# Patient Record
Sex: Female | Born: 1935 | Race: White | Hispanic: No | Marital: Married | Smoking: Never smoker
Health system: Southern US, Community
[De-identification: ages and names within clinical notes are randomized; demographics above are authoritative.]

## PROBLEM LIST (undated history)

## (undated) DIAGNOSIS — K635 Polyp of colon: Secondary | ICD-10-CM

## (undated) DIAGNOSIS — F0281 Dementia in other diseases classified elsewhere with behavioral disturbance: Secondary | ICD-10-CM

## (undated) DIAGNOSIS — G301 Alzheimer's disease with late onset: Secondary | ICD-10-CM

## (undated) DIAGNOSIS — I10 Essential (primary) hypertension: Secondary | ICD-10-CM

## (undated) DIAGNOSIS — I219 Acute myocardial infarction, unspecified: Secondary | ICD-10-CM

## (undated) DIAGNOSIS — K5790 Diverticulosis of intestine, part unspecified, without perforation or abscess without bleeding: Secondary | ICD-10-CM

## (undated) DIAGNOSIS — J45909 Unspecified asthma, uncomplicated: Secondary | ICD-10-CM

## (undated) HISTORY — PX: ABDOMINAL HYSTERECTOMY: SHX81

## (undated) HISTORY — PX: HERNIA REPAIR: SHX51

---

## 2004-08-11 ENCOUNTER — Ambulatory Visit: Payer: Self-pay | Admitting: Internal Medicine

## 2004-11-30 ENCOUNTER — Emergency Department: Payer: Self-pay | Admitting: Emergency Medicine

## 2006-07-19 ENCOUNTER — Ambulatory Visit: Payer: Self-pay | Admitting: Unknown Physician Specialty

## 2007-07-11 ENCOUNTER — Ambulatory Visit: Payer: Self-pay | Admitting: General Surgery

## 2007-07-11 ENCOUNTER — Other Ambulatory Visit: Payer: Self-pay

## 2007-07-17 ENCOUNTER — Ambulatory Visit: Payer: Self-pay | Admitting: General Surgery

## 2009-07-30 ENCOUNTER — Ambulatory Visit: Payer: Self-pay | Admitting: Ophthalmology

## 2009-08-11 ENCOUNTER — Ambulatory Visit: Payer: Self-pay | Admitting: Ophthalmology

## 2012-11-22 ENCOUNTER — Ambulatory Visit: Payer: Self-pay | Admitting: Internal Medicine

## 2016-06-02 ENCOUNTER — Other Ambulatory Visit: Payer: Self-pay | Admitting: Neurology

## 2016-06-02 DIAGNOSIS — R413 Other amnesia: Secondary | ICD-10-CM

## 2016-06-02 DIAGNOSIS — R410 Disorientation, unspecified: Secondary | ICD-10-CM

## 2016-06-08 ENCOUNTER — Ambulatory Visit
Admission: RE | Admit: 2016-06-08 | Discharge: 2016-06-08 | Disposition: A | Discharge status: Home / Self Care | Payer: Medicare Other | Source: Ambulatory Visit | Attending: Neurology | Admitting: Neurology

## 2016-06-08 DIAGNOSIS — G319 Degenerative disease of nervous system, unspecified: Secondary | ICD-10-CM | POA: Diagnosis not present

## 2016-06-08 DIAGNOSIS — I638 Other cerebral infarction: Secondary | ICD-10-CM | POA: Insufficient documentation

## 2016-06-08 DIAGNOSIS — R413 Other amnesia: Secondary | ICD-10-CM | POA: Insufficient documentation

## 2016-06-08 DIAGNOSIS — R410 Disorientation, unspecified: Secondary | ICD-10-CM | POA: Insufficient documentation

## 2017-01-10 ENCOUNTER — Emergency Department: Payer: Medicare Other

## 2017-01-10 ENCOUNTER — Observation Stay
Admission: EM | Admit: 2017-01-10 | Discharge: 2017-01-12 | Disposition: A | Discharge status: Home Health | Payer: Medicare Other | Attending: Internal Medicine | Admitting: Internal Medicine

## 2017-01-10 DIAGNOSIS — I638 Other cerebral infarction: Secondary | ICD-10-CM | POA: Diagnosis not present

## 2017-01-10 DIAGNOSIS — Z79899 Other long term (current) drug therapy: Secondary | ICD-10-CM | POA: Diagnosis not present

## 2017-01-10 DIAGNOSIS — I252 Old myocardial infarction: Secondary | ICD-10-CM | POA: Insufficient documentation

## 2017-01-10 DIAGNOSIS — I10 Essential (primary) hypertension: Secondary | ICD-10-CM | POA: Diagnosis not present

## 2017-01-10 DIAGNOSIS — Z9181 History of falling: Secondary | ICD-10-CM | POA: Insufficient documentation

## 2017-01-10 DIAGNOSIS — J45909 Unspecified asthma, uncomplicated: Secondary | ICD-10-CM | POA: Insufficient documentation

## 2017-01-10 DIAGNOSIS — R29700 NIHSS score 0: Secondary | ICD-10-CM | POA: Insufficient documentation

## 2017-01-10 DIAGNOSIS — G459 Transient cerebral ischemic attack, unspecified: Secondary | ICD-10-CM | POA: Diagnosis present

## 2017-01-10 DIAGNOSIS — F0281 Dementia in other diseases classified elsewhere with behavioral disturbance: Secondary | ICD-10-CM | POA: Diagnosis not present

## 2017-01-10 DIAGNOSIS — W19XXXA Unspecified fall, initial encounter: Secondary | ICD-10-CM | POA: Diagnosis present

## 2017-01-10 DIAGNOSIS — G301 Alzheimer's disease with late onset: Secondary | ICD-10-CM | POA: Insufficient documentation

## 2017-01-10 DIAGNOSIS — F0280 Dementia in other diseases classified elsewhere without behavioral disturbance: Secondary | ICD-10-CM | POA: Diagnosis present

## 2017-01-10 DIAGNOSIS — R100 Acute abdomen: Secondary | ICD-10-CM

## 2017-01-10 DIAGNOSIS — R55 Syncope and collapse: Secondary | ICD-10-CM | POA: Diagnosis present

## 2017-01-10 DIAGNOSIS — G309 Alzheimer's disease, unspecified: Secondary | ICD-10-CM

## 2017-01-10 DIAGNOSIS — R531 Weakness: Secondary | ICD-10-CM

## 2017-01-10 HISTORY — DX: Unspecified asthma, uncomplicated: J45.909

## 2017-01-10 HISTORY — DX: Dementia in other diseases classified elsewhere with behavioral disturbance: F02.81

## 2017-01-10 HISTORY — DX: Diverticulosis of intestine, part unspecified, without perforation or abscess without bleeding: K57.90

## 2017-01-10 HISTORY — DX: Acute myocardial infarction, unspecified: I21.9

## 2017-01-10 HISTORY — DX: Alzheimer's disease with late onset: G30.1

## 2017-01-10 HISTORY — DX: Essential (primary) hypertension: I10

## 2017-01-10 HISTORY — DX: Polyp of colon: K63.5

## 2017-01-10 NOTE — ED Triage Notes (Signed)
Pt arrives [facility] from home after a fall with L sided weakness per family. EMS did an NIH, was 0. Rechecked it, again was 0. Pt has no L sided weakness at this time, speech clear. Hx dementia. Pt ambulatory to toilet with shuffling gait with 1 assistance. CBG 134.

## 2017-01-10 NOTE — ED Provider Notes (Signed)
[facility] Emergency Department Provider Note   First MD Initiated Contact with Patient 01/10/17 2306     (approximate)  I have reviewed the triage vital signs and the nursing notes.  History obtained from the patient's son HISTORY  Chief Complaint Weakness and Fall    HPI Theresa Frank is a 83 y.o. female with below list of chronic medical conditions present with history of being found on the floor by her son today. Patient's son states that on arrival patient had left-sided arm and leg weakness as well as left facial drooping. The patient's son states the symptoms have since resolved but it lasted approximately one hour.   Past Medical History:  Diagnosis Date  . Alzheimer's dementia, late onset, with behavioral disturbance   . Asthma   . Colon polyps   . Diverticulosis   . Hypertension   . MI (myocardial infarction) [facility])     Patient Active Problem List   Diagnosis Date Noted  . Fall 01/11/2017  . TIA (transient ischemic attack) 01/10/2017  . HTN (hypertension) 01/10/2017  . Asthma 01/10/2017  . Alzheimer disease 01/10/2017    Past Surgical History:  Procedure Laterality Date  . ABDOMINAL HYSTERECTOMY    . HERNIA REPAIR      Prior to Admission medications   Medication Sig Start Date End Date Taking? Authorizing Provider  donepezil (ARICEPT) 5 MG tablet Take 5 mg by mouth at bedtime.   Yes [provider]  Fluticasone-Salmeterol (ADVAIR DISKUS) 250-50 MCG/DOSE AEPB Inhale 1 puff into the lungs 2 (two) times daily.   Yes [provider]  memantine (NAMENDA) 10 MG tablet Take 10 mg by mouth 2 (two) times daily.   Yes [provider]  mirtazapine (REMERON) 15 MG tablet Take 7.5 mg by mouth at bedtime.   Yes [provider]  montelukast (SINGULAIR) 10 MG tablet Take 10 mg by mouth daily.   Yes [provider]    Allergies Ciprofloxacin; Penicillins; Premarin [conjugated estrogens]; Septra  [sulfamethoxazole-trimethoprim]; and Zocor [simvastatin]  Family History  Problem Relation Age of Onset  . Stomach cancer Father     Social History Social History  Substance Use Topics  . Smoking status: Never Smoker  . Smokeless tobacco: Not on file  . Alcohol use No    Review of Systems Constitutional: No fever/chills Eyes: No visual changes. ENT: No sore throat. Cardiovascular: Denies chest pain. Respiratory: Denies shortness of breath. Gastrointestinal: No abdominal pain.  No nausea, no vomiting.  No diarrhea.  No constipation. Genitourinary: Negative for dysuria. Musculoskeletal: Negative for back pain. Integumentary: Negative for rash. Neurological:Positive for left arm and leg and facial weakness (she is now resolved)   ____________________________________________   PHYSICAL EXAM:  VITAL SIGNS: ED Triage Vitals  Enc Vitals Group     BP 01/10/17 2244 107/77     Pulse Rate 01/10/17 2244 72     Resp 01/10/17 2244 16     Temp 01/10/17 2244 98.6 F (37 C)     Temp Source 01/10/17 2244 Oral     SpO2 01/10/17 2244 97 %     Weight 01/10/17 2244 113 lb 11.2 oz (51.6 kg)     Height --      Head Circumference --      Peak Flow --      Pain Score 01/10/17 2243 0     Pain Loc --      Pain Edu? --      Excl. in GC? --  Constitutional: Alert and oriented. Well appearing and in no acute distress. Eyes: Conjunctivae are normal. PERRL. EOMI. Head: Atraumatic. Mouth/Throat: Mucous membranes are moist. Oropharynx non-erythematous. Neck: No stridor.   Cardiovascular: Normal rate, regular rhythm. Good peripheral circulation. Grossly normal heart sounds. Respiratory: Normal respiratory effort.  No retractions. Lungs CTAB. Gastrointestinal: Soft and nontender. No distention.  Musculoskeletal: No lower extremity tenderness nor edema. No gross deformities of extremities. Neurologic:  Normal speech and language. No gross focal neurologic deficits are appreciated.  Skin:   Skin is warm, dry and intact. No rash noted. Psychiatric: Mood and affect are normal. Speech and behavior are normal.  ____________________________________________   LABS (all labs ordered are listed, but only abnormal results are displayed)  Labs Reviewed  BASIC METABOLIC PANEL - Abnormal; Notable for the following:       Result Value   Glucose, Bld 107 (*)    BUN 23 (*)    GFR calc non Af Amer 51 (*)    GFR calc Af Amer 59 (*)    All other components within normal limits  CBC - Abnormal; Notable for the following:    Hemoglobin 11.8 (*)    HCT 34.6 (*)    All other components within normal limits  URINALYSIS, COMPLETE (UACMP) WITH MICROSCOPIC - Abnormal; Notable for the following:    Color, Urine YELLOW (*)    APPearance CLEAR (*)    Squamous Epithelial / LPF 0-5 (*)    All other components within normal limits  TROPONIN I  TSH  HEMOGLOBIN A1C   ____________________________________________  EKG  ED ECG REPORT I, [employer] N BROWN, the attending physician, personally viewed and interpreted this ECG.   Date: 01/10/2017  EKG Time: 11:22 PM  Rate: 68  Rhythm: Normal sinus rhythm  Axis: Normal  Intervals: Normal  ST&T Change: None  ____________________________________________  RADIOLOGY I, [place] N BROWN, personally viewed and evaluated these images (plain radiographs) as part of my medical decision making, as well as reviewing the written report by the radiologist.  Ct Head Wo Contrast  Result Date: 01/11/2017 CLINICAL DATA:  Status post fall, with confusion. Concern for head injury. Initial encounter. EXAM: CT HEAD WITHOUT CONTRAST TECHNIQUE: Contiguous axial images were obtained from the base of the skull through the vertex without intravenous contrast. COMPARISON:  CT of the head performed 06/08/2016 FINDINGS: Brain: No evidence of acute infarction, hemorrhage, hydrocephalus, extra-axial collection or mass lesion/mass effect. Prominence of the ventricles and sulci  reflects mild to moderate cortical volume loss. Cerebellar atrophy is noted. Scattered periventricular and subcortical white matter change likely reflects small vessel ischemic microangiopathy. Chronic lacunar infarcts are noted at the basal ganglia bilaterally. The brainstem and fourth ventricle are within normal limits. The cerebral hemispheres demonstrate grossly normal gray-white differentiation. No mass effect or midline shift is seen. Vascular: No hyperdense vessel or unexpected calcification. Skull: There is no evidence of fracture; visualized osseous structures are unremarkable in appearance. Sinuses/Orbits: The visualized portions of the orbits are within normal limits. The paranasal sinuses and mastoid air cells are well-aerated. Other: Mild soft tissue swelling is noted overlying the left posterior parietal calvarium. IMPRESSION: 1. No evidence of traumatic intracranial injury or fracture. 2. Mild soft tissue swelling overlying the left posterior parietal calvarium. 3. Mild to moderate cortical volume loss and scattered small vessel ischemic microangiopathy. 4. Chronic lacunar infarcts at the basal ganglia bilaterally. Electronically Signed   By: Roanna Raider M.D.   On: 01/11/2017 00:10   Dg Chest [facility]  Result Date: 01/11/2017 CLINICAL DATA:  Syncope today. EXAM: PORTABLE CHEST 1 VIEW COMPARISON:  Report from chest radiographs 10/21/2016, 06/10/2014, images not available. FINDINGS: Mild cardiomegaly, normal mediastinal contours. Atherosclerosis of the aortic arch. Small left pleural effusion. No evidence pulmonary edema. No confluent airspace disease. No pneumothorax. No acute osseous abnormalities are seen. IMPRESSION: Mild cardiomegaly, atherosclerosis of the thoracic aorta. Cardiomegaly was not described on prior exam. Small left pleural effusion. No pulmonary edema or findings of congestive failure. Electronically Signed   By: Rubye Oaks M.D.   On: 01/11/2017 00:04        Procedures   ____________________________________________   INITIAL IMPRESSION / ASSESSMENT AND PLAN / ED COURSE  Pertinent labs & imaging results that were available during my care of the patient were reviewed by me and considered in my medical decision making (see chart for details).  83 year old female presenting with history of left facial droop left arm and leg weakness that is completely resolved. Concern for possible TIA. Patient was also found down by her son raising concern for possible syncope. As such patient discussed with Dr. Sheryle Hail for hospital admission for further evaluation and management      ____________________________________________  FINAL CLINICAL IMPRESSION(S) / ED DIAGNOSES  Final diagnoses:  TIA (transient ischemic attack)     MEDICATIONS GIVEN DURING THIS VISIT:  Medications  mometasone-formoterol (DULERA) 200-5 MCG/ACT inhaler 2 puff (not administered)  memantine (NAMENDA) tablet 10 mg (not administered)  mirtazapine (REMERON) tablet 7.5 mg (not administered)  donepezil (ARICEPT) tablet 5 mg (not administered)  montelukast (SINGULAIR) tablet 10 mg (not administered)  heparin injection 5,000 Units (5,000 Units Subcutaneous Given 01/11/17 0614)  acetaminophen (TYLENOL) tablet 650 mg (not administered)    Or  acetaminophen (TYLENOL) suppository 650 mg (not administered)  docusate sodium (COLACE) capsule 100 mg (not administered)  ondansetron (ZOFRAN) tablet 4 mg (not administered)    Or  ondansetron (ZOFRAN) injection 4 mg (not administered)   stroke: mapping our early stages of recovery book ( Does not apply Given 01/11/17 0609)     NEW OUTPATIENT MEDICATIONS STARTED DURING THIS VISIT:  Current Discharge Medication List      Current Discharge Medication List      Current Discharge Medication List       Note:  This document was prepared using Dragon voice recognition software and may include unintentional dictation  errors.    Darci Current, MD 01/11/17 [phone]

## 2017-01-10 NOTE — ED Notes (Signed)
Per son, pt does not remember fall. Pt was unable to move L leg and L arm per son. Pt able to move all extremities at this time and walk. Pt is confused. Knows name, birthday, location. Unsure of situation or date.

## 2017-01-11 ENCOUNTER — Observation Stay: Payer: Medicare Other

## 2017-01-11 ENCOUNTER — Observation Stay (HOSPITAL_BASED_OUTPATIENT_CLINIC_OR_DEPARTMENT_OTHER)
Admit: 2017-01-11 | Discharge: 2017-01-11 | Disposition: A | Discharge status: Home / Self Care | Payer: Medicare Other | Attending: Internal Medicine | Admitting: Internal Medicine

## 2017-01-11 DIAGNOSIS — I639 Cerebral infarction, unspecified: Secondary | ICD-10-CM

## 2017-01-11 DIAGNOSIS — W19XXXA Unspecified fall, initial encounter: Secondary | ICD-10-CM | POA: Diagnosis present

## 2017-01-11 DIAGNOSIS — G459 Transient cerebral ischemic attack, unspecified: Secondary | ICD-10-CM | POA: Diagnosis not present

## 2017-01-11 DIAGNOSIS — E756 Lipid storage disorder, unspecified: Secondary | ICD-10-CM | POA: Diagnosis not present

## 2017-01-11 LAB — URINALYSIS, COMPLETE (UACMP) WITH MICROSCOPIC
BACTERIA UA: NONE SEEN
Bilirubin Urine: NEGATIVE
Glucose, UA: NEGATIVE mg/dL
Hgb urine dipstick: NEGATIVE
KETONES UR: NEGATIVE mg/dL
Leukocytes, UA: NEGATIVE
Nitrite: NEGATIVE
PROTEIN: NEGATIVE mg/dL
RBC / HPF: NONE SEEN RBC/hpf (ref 0–5)
Specific Gravity, Urine: 1.014 (ref 1.005–1.030)
pH: 5 (ref 5.0–8.0)

## 2017-01-11 LAB — CBC
HEMATOCRIT: 34.6 % — AB (ref 35.0–47.0)
Hemoglobin: 11.8 g/dL — ABNORMAL LOW (ref 12.0–16.0)
MCH: 30.5 pg (ref 26.0–34.0)
MCHC: 34.2 g/dL (ref 32.0–36.0)
MCV: 89.4 fL (ref 80.0–100.0)
Platelets: 308 10*3/uL (ref 150–440)
RBC: 3.87 MIL/uL (ref 3.80–5.20)
RDW: 13.9 % (ref 11.5–14.5)
WBC: 6.7 10*3/uL (ref 3.6–11.0)

## 2017-01-11 LAB — BASIC METABOLIC PANEL
Anion gap: 6 (ref 5–15)
BUN: 23 mg/dL — AB (ref 6–20)
CHLORIDE: 103 mmol/L (ref 101–111)
CO2: 28 mmol/L (ref 22–32)
CREATININE: 0.95 mg/dL (ref 0.44–1.00)
Calcium: 9.3 mg/dL (ref 8.9–10.3)
GFR calc Af Amer: 59 mL/min — ABNORMAL LOW (ref >60)
GFR calc non Af Amer: 51 mL/min — ABNORMAL LOW (ref >60)
GLUCOSE: 107 mg/dL — AB (ref 65–99)
POTASSIUM: 4.1 mmol/L (ref 3.5–5.1)
Sodium: 137 mmol/L (ref 135–145)

## 2017-01-11 LAB — ECHOCARDIOGRAM COMPLETE
Height: 68 in
Weight: 1662.4 oz

## 2017-01-11 LAB — TSH: TSH: 0.826 u[IU]/mL (ref 0.350–4.500)

## 2017-01-11 LAB — TROPONIN I: Troponin I: <0.03 ng/mL (ref <0.03)

## 2017-01-11 MED ORDER — ASPIRIN EC 81 MG PO TBEC
81.0000 mg | DELAYED_RELEASE_TABLET | Freq: Every day | ORAL | Status: DC
  Administered 2017-01-11 – 2017-01-12 (×2): 81 mg via ORAL
  Filled 2017-01-11 (×2): qty 1

## 2017-01-11 MED ORDER — MEMANTINE HCL 5 MG PO TABS
10.0000 mg | ORAL_TABLET | Freq: Two times a day (BID) | ORAL | Status: DC
  Administered 2017-01-11 – 2017-01-12 (×2): 10 mg via ORAL
  Filled 2017-01-11 (×3): qty 2

## 2017-01-11 MED ORDER — MIRTAZAPINE 15 MG PO TABS
7.5000 mg | ORAL_TABLET | Freq: Every day | ORAL | Status: DC
  Filled 2017-01-11: qty 1

## 2017-01-11 MED ORDER — ENSURE ENLIVE PO LIQD
237.0000 mL | Freq: Two times a day (BID) | ORAL | Status: DC
  Administered 2017-01-12: 237 mL via ORAL

## 2017-01-11 MED ORDER — DONEPEZIL HCL 5 MG PO TABS
5.0000 mg | ORAL_TABLET | Freq: Every day | ORAL | Status: DC
  Filled 2017-01-11: qty 1

## 2017-01-11 MED ORDER — MOMETASONE FURO-FORMOTEROL FUM 200-5 MCG/ACT IN AERO
2.0000 | INHALATION_SPRAY | Freq: Two times a day (BID) | RESPIRATORY_TRACT | Status: DC
  Administered 2017-01-11 – 2017-01-12 (×3): 2 via RESPIRATORY_TRACT
  Filled 2017-01-11: qty 8.8

## 2017-01-11 MED ORDER — HEPARIN SODIUM (PORCINE) 5000 UNIT/ML IJ SOLN
5000.0000 [IU] | Freq: Three times a day (TID) | INTRAMUSCULAR | Status: DC
  Administered 2017-01-11 – 2017-01-12 (×4): 5000 [IU] via SUBCUTANEOUS
  Filled 2017-01-11 (×4): qty 1

## 2017-01-11 MED ORDER — STROKE: EARLY STAGES OF RECOVERY BOOK
Freq: Once | Status: AC
  Administered 2017-01-11: 06:00:00

## 2017-01-11 MED ORDER — ADULT MULTIVITAMIN W/MINERALS CH
1.0000 | ORAL_TABLET | Freq: Every day | ORAL | Status: DC
  Administered 2017-01-11 – 2017-01-12 (×2): 1 via ORAL
  Filled 2017-01-11 (×2): qty 1

## 2017-01-11 MED ORDER — ONDANSETRON HCL 4 MG/2ML IJ SOLN: 4.0000 mg | Freq: Four times a day (QID) | INTRAMUSCULAR | Status: DC | PRN

## 2017-01-11 MED ORDER — ACETAMINOPHEN 325 MG PO TABS: 650.0000 mg | ORAL_TABLET | Freq: Four times a day (QID) | ORAL | Status: DC | PRN

## 2017-01-11 MED ORDER — ACETAMINOPHEN 650 MG RE SUPP: 650.0000 mg | Freq: Four times a day (QID) | RECTAL | Status: DC | PRN

## 2017-01-11 MED ORDER — ONDANSETRON HCL 4 MG PO TABS: 4.0000 mg | ORAL_TABLET | Freq: Four times a day (QID) | ORAL | Status: DC | PRN

## 2017-01-11 MED ORDER — DOCUSATE SODIUM 100 MG PO CAPS
100.0000 mg | ORAL_CAPSULE | Freq: Two times a day (BID) | ORAL | Status: DC
  Administered 2017-01-11 – 2017-01-12 (×2): 100 mg via ORAL
  Filled 2017-01-11 (×3): qty 1

## 2017-01-11 MED ORDER — MONTELUKAST SODIUM 10 MG PO TABS
10.0000 mg | ORAL_TABLET | Freq: Every day | ORAL | Status: DC
  Filled 2017-01-11: qty 1

## 2017-01-11 NOTE — Consult Note (Signed)
Referring Physician: Sudini    Chief Complaint: Fall with left sided weakness  HPI: Theresa Frank is an 83 y.o. female with a history of dementia who is unable to provide any history.  No family available.  All history obtained from the chart.  She was found on the floor by her son yesterday. Patient's son states that on arrival patient had left-sided arm and leg weakness as well as left facial drooping. The patient's son states the symptoms had since resolved on presentation but it lasted approximately one hour.  EMS NIHSS of 0.     Date last known well: Unable to determine Time last known well: Unable to determine tPA Given: No: Unable to determine LKW  Past Medical History:  Diagnosis Date  . Alzheimer's dementia, late onset, with behavioral disturbance   . Asthma   . Colon polyps   . Diverticulosis   . Hypertension   . MI (my[facility] infarction) [facility])     Past Surgical History:  Procedure Laterality Date  . ABDOMINAL HYSTERECTOMY    . HERNIA REPAIR      Family History  Problem Relation Age of Onset  . Stomach cancer Father    Social History:  reports that she has never smoked. She has never used smokeless tobacco. She reports that she does not drink alcohol or use drugs.  Allergies:  Allergies  Allergen Reactions  . Ciprofloxacin   . Penicillins   . Premarin [Conjugated Estrogens]   . Septra [Sulfamethoxazole-Trimethoprim]   . Zocor [Simvastatin]     Medications:  I have reviewed the patient's current medications. Prior to Admission:  Prescriptions Prior to Admission  Medication Sig Dispense Refill Last Dose  . donepezil (ARICEPT) 5 MG tablet Take 5 mg by mouth at bedtime.     . Fluticasone-Salmeterol (ADVAIR DISKUS) 250-50 MCG/DOSE AEPB Inhale 1 puff into the lungs 2 (two) times daily.     . memantine (NAMENDA) 10 MG tablet Take 10 mg by mouth 2 (two) times daily.     . mirtazapine (REMERON) 15 MG tablet Take 7.5 mg by mouth at bedtime.     . montelukast  (SINGULAIR) 10 MG tablet Take 10 mg by mouth daily.      Scheduled: . docusate sodium  100 mg Oral BID  . donepezil  5 mg Oral QHS  . heparin  5,000 Units Subcutaneous Q8H  . memantine  10 mg Oral BID  . mirtazapine  7.5 mg Oral QHS  . mometasone-formoterol  2 puff Inhalation BID  . montelukast  10 mg Oral QHS    ROS: History obtained from the patient  General ROS: negative for - chills, fatigue, fever, night sweats, weight gain or weight loss Psychological ROS: negative for - behavioral disorder, hallucinations, memory difficulties, mood swings or suicidal ideation Ophthalmic ROS: negative for - blurry vision, double vision, eye pain or loss of vision ENT ROS: negative for - epistaxis, nasal discharge, oral lesions, sore throat, tinnitus or vertigo Allergy and Immunology ROS: negative for - hives or itchy/watery eyes Hematological and Lymphatic ROS: negative for - bleeding problems, bruising or swollen lymph nodes Endocrine ROS: negative for - galactorrhea, hair pattern changes, polydipsia/polyuria or temperature intolerance Respiratory ROS: negative for - cough, hemoptysis, shortness of breath or wheezing Cardiovascular ROS: negative for - chest pain, dyspnea on exertion, edema or irregular heartbeat Gastrointestinal ROS: negative for - abdominal pain, diarrhea, hematemesis, nausea/vomiting or stool incontinence Genito-Urinary ROS: negative for - dysuria, hematuria, incontinence or urinary frequency/urgency Musculoskeletal ROS: negative  for - joint swelling or muscular weakness Neurological ROS: as noted in HPI Dermatological ROS: negative for rash and skin lesion changes  Physical Examination: Blood pressure (!) 102/58, pulse 69, temperature 98.5 F (36.9 C), temperature source Oral, resp. rate 20, height 5\' 8"  (1.727 m), weight 47.1 kg (103 lb 14.4 oz), SpO2 97 %.  HEENT-  Normocephalic, no lesions, without obvious abnormality.  Normal external eye and conjunctiva.  Normal TM's  bilaterally.  Normal auditory canals and external ears. Normal external nose, mucus membranes and septum.  Normal pharynx. Cardiovascular- S1, S2 normal, pulses palpable throughout   Lungs- chest clear, no wheezing, rales, normal symmetric air entry Abdomen- soft, non-tender; bowel sounds normal; no masses,  no organomegaly Extremities- no edema Lymph-no adenopathy palpable Musculoskeletal-no joint tenderness, deformity or swelling Skin-warm and dry, no hyperpigmentation, vitiligo, or suspicious lesions  Neurological Examination   Mental Status: Alert, oriented to name but not to place or year, thought content appropriate.  Speech fluent without evidence of aphasia.  Able to follow 3 step commands with some need of reinforcement. Cranial Nerves: II: Discs flat bilaterally; Visual fields grossly normal, pupils equal, round, reactive to light and accommodation III,IV, VI: ptosis not present, extra-ocular motions intact bilaterally V,VII: mild left facial droop, facial light touch sensation normal bilaterally VIII: hearing normal bilaterally IX,X: gag reflex present XI: bilateral shoulder shrug XII: midline tongue extension Motor: Right : Upper extremity   5/5    Left:     Upper extremity   5-/5 with drift  Lower extremity   5/5     Lower extremity   4+/5 with external rotation Tone and bulk:normal tone throughout; no atrophy noted Sensory: Pinprick and light touch intact throughout, bilaterally Deep Tendon Reflexes: 1+ and symmetric with absent AJ's bilaterally Plantars: Right: downgoing   Left: upgoing Cerebellar: Normal finger-to-nose and normal heel-to-shin testing bilaterally Gait: not tested due to safety concerns    Laboratory Studies:  Basic Metabolic Panel:  Recent Labs Lab 01/10/17 2358  NA 137  K 4.1  CL 103  CO2 28  GLUCOSE 107*  BUN 23*  CREATININE 0.95  CALCIUM 9.3    Liver Function Tests: No results for input(s): AST, ALT, ALKPHOS, BILITOT, PROT, ALBUMIN  in the last 168 hours. No results for input(s): LIPASE, AMYLASE in the last 168 hours. No results for input(s): AMMONIA in the last 168 hours.  CBC:  Recent Labs Lab 01/10/17 2358  WBC 6.7  HGB 11.8*  HCT 34.6*  MCV 89.4  PLT 308    Cardiac Enzymes:  Recent Labs Lab 01/10/17 2358  TROPONINI <0.03    BNP: Invalid input(s): POCBNP  CBG: No results for input(s): GLUCAP in the last 168 hours.  Microbiology: No results found for this or any previous visit.  Coagulation Studies: No results for input(s): LABPROT, INR in the last 72 hours.  Urinalysis:  Recent Labs Lab 01/10/17 2358  COLORURINE YELLOW*  LABSPEC 1.014  PHURINE 5.0  GLUCOSEU NEGATIVE  HGBUR NEGATIVE  BILIRUBINUR NEGATIVE  KETONESUR NEGATIVE  PROTEINUR NEGATIVE  NITRITE NEGATIVE  LEUKOCYTESUR NEGATIVE    Lipid Panel: No results found for: CHOL, TRIG, HDL, CHOLHDL, VLDL, LDLCALC  HgbA1C: No results found for: HGBA1C  Urine Drug Screen:  No results found for: LABOPIA, COCAINSCRNUR, LABBENZ, AMPHETMU, THCU, LABBARB  Alcohol Level: No results for input(s): ETH in the last 168 hours.  Other results: EKG: sinus rhythm at 68 bpm.  Imaging: Ct Head Wo Contrast  Result Date: 01/11/2017 CLINICAL DATA:  Status post fall, with confusion. Concern for head injury. Initial encounter. EXAM: CT HEAD WITHOUT CONTRAST TECHNIQUE: Contiguous axial images were obtained from the base of the skull through the vertex without intravenous contrast. COMPARISON:  CT of the head performed 06/08/2016 FINDINGS: Brain: No evidence of acute infarction, hemorrhage, hydrocephalus, extra-axial collection or mass lesion/mass effect. Prominence of the ventricles and sulci reflects mild to moderate cortical volume loss. Cerebellar atrophy is noted. Scattered periventricular and subcortical white matter change likely reflects small vessel ischemic microangiopathy. Chronic lacunar infarcts are noted at the basal ganglia bilaterally. The  brainstem and fourth ventricle are within normal limits. The cerebral hemispheres demonstrate grossly normal gray-white differentiation. No mass effect or midline shift is seen. Vascular: No hyperdense vessel or unexpected calcification. Skull: There is no evidence of fracture; visualized osseous structures are unremarkable in appearance. Sinuses/Orbits: The visualized portions of the orbits are within normal limits. The paranasal sinuses and mastoid air cells are well-aerated. Other: Mild soft tissue swelling is noted overlying the left posterior parietal calvarium. IMPRESSION: 1. No evidence of traumatic intracranial injury or fracture. 2. Mild soft tissue swelling overlying the left posterior parietal calvarium. 3. Mild to moderate cortical volume loss and scattered small vessel ischemic microangiopathy. 4. Chronic lacunar infarcts at the basal ganglia bilaterally. Electronically Signed   By: Roanna Raider M.D.   On: 01/11/2017 00:10   US Carotid Bilateral (at [facility])  Result Date: 01/11/2017 CLINICAL DATA:  TIA. EXAM: BILATERAL CAROTID DUPLEX ULTRASOUND TECHNIQUE: Wallace Cullens scale imaging, color Doppler and duplex ultrasound were performed of bilateral carotid and vertebral arteries in the neck. COMPARISON:  None. FINDINGS: Criteria: Quantification of carotid stenosis is based on velocity parameters that correlate the residual internal carotid diameter with NASCET-based stenosis levels, using the diameter of the distal internal carotid lumen as the denominator for stenosis measurement. The following velocity measurements were obtained: RIGHT ICA:  65 cm/sec CCA:  68 cm/sec SYSTOLIC ICA/CCA RATIO:  1.0 DIASTOLIC ICA/CCA RATIO:  1.2 ECA:  75 cm/sec LEFT ICA:  62 cm/sec CCA:  80 cm/sec SYSTOLIC ICA/CCA RATIO:  0.8 DIASTOLIC ICA/CCA RATIO:  2.4 ECA:  109 cm/sec RIGHT CAROTID ARTERY: Atherosclerotic plaque throughout the right common carotid artery. Echogenic plaque at the right carotid bulb. External carotid  artery is patent with normal waveform. Normal waveforms and velocities in the internal carotid artery. RIGHT VERTEBRAL ARTERY: Antegrade flow and normal waveform in the right vertebral artery. LEFT CAROTID ARTERY: Intimal thickening in the left common carotid artery. Mild plaque at the left carotid bulb. External carotid artery is patent with normal waveform. Normal waveforms and velocities in the internal carotid artery. LEFT VERTEBRAL ARTERY: Antegrade flow and normal waveform in the left vertebral artery. IMPRESSION: Mild atherosclerotic disease in the bilateral carotid arteries. Estimated degree of stenosis in the internal carotid arteries is less than 50% bilaterally. Patent vertebral arteries with antegrade flow. Electronically Signed   By: Richarda Overlie M.D.   On: 01/11/2017 11:07   Dg Chest Port 1 View  Result Date: 01/11/2017 CLINICAL DATA:  Syncope today. EXAM: PORTABLE CHEST 1 VIEW COMPARISON:  Report from chest radiographs 10/21/2016, 06/10/2014, images not available. FINDINGS: Mild cardiomegaly, normal mediastinal contours. Atherosclerosis of the aortic arch. Small left pleural effusion. No evidence pulmonary edema. No confluent airspace disease. No pneumothorax. No acute osseous abnormalities are seen. IMPRESSION: Mild cardiomegaly, atherosclerosis of the thoracic aorta. Cardiomegaly was not described on prior exam. Small left pleural effusion. No pulmonary edema or findings of congestive failure. Electronically  Signed   By: Rubye Oaks M.D.   On: 01/11/2017 00:04    Assessment: 83 y.o. female presenting after a fall with left sided weakness.  Head CT reviewed and shows no acute changes but can not rule out a lacunar IC infarct.  Further work up recommended.  Carotid dopplers show no evidence of hemodynamically significant stenosis.  Echocardiogram pending.  A1c pending.  Stroke Risk Factors - hypertension  Plan: 1. Fasting lipid panel 2. MRI, MRA  of the brain without contrast 3. PT  consult, OT consult, Speech consult 4. Prophylactic therapy-Antiplatelet med: Aspirin - dose 81mg  daily 5. NPO until RN stroke swallow screen 6. Telemetry monitoring 7. Frequent neuro checks   Thana Farr, MD Neurology [phone] 01/11/2017, 1:18 PM

## 2017-01-11 NOTE — Care Management Note (Addendum)
Case Management Note  Patient Details  Name: Theresa Frank MRN: [id number] Date of Birth: 04/11/1925  Subjective/Objective:      Admitted to this facility under observation status with the diagnosis of TIA. Lives with husband, Freida Busman [phone]) and son Raiford Noble [phone]), Last seen Dr Rogue Jury "not too long ago." No Home Health services. No skilled facility. No home oxygen. Prescriptions are filled at [facility] in [place]. Takes care of all basic activities of daily living herself, drives. Rolling walker and cane in the home. Larey Seat prior to this admission. Good appetite. Family will transport.              Action/Plan: Stroke work-up in progress   Expected Discharge Date:  01/12/17               Expected Discharge Plan:     In-House Referral:     Discharge planning Services     Post Acute Care Choice:    Choice offered to:     DME Arranged:    DME Agency:     HH Arranged:   yes HH Agency:   [facility]   Status of Service:     If discussed at [employer], dates discussed:    Additional Comments:  Gwenette Greet, RN MSN CCM Care Management [phone] 01/11/2017, 11:15 AM

## 2017-01-11 NOTE — Evaluation (Signed)
Physical Therapy Evaluation Patient Details Name: Theresa Frank MRN: [id number] DOB: 09-30-1924 Today's Date: 01/11/2017   History of Present Illness  83 y/o female with past medical history of dementia and asthma presents to the emergency department after a fall. She was found at home by her son reports that she seemed to be unconscious at the time.  Clinical Impression  Pt at her baseline mental status with some confusion but ultimately was eager to work with PT and ambulate.  She had slow, cautious, choppy cadence but did not have any LOBs, ultimately she was able to do some limited ambulation w/o UEs but did better with walker (however turning, etc was more difficult with walker as pt was apparently not used to using any AD).  Pt lives with husband and they do have intermittent assistance, pt should be safe to go home, would benefit from having near 24/7 assist initially, will recommend HHPT to work with strength, gait and mobility.     Follow Up Recommendations Home health PT    Equipment Recommendations       Recommendations for Other Services       Precautions / Restrictions Precautions Precautions: Fall Restrictions Weight Bearing Restrictions: No      Mobility  Bed Mobility Overal bed mobility: Needs Assistance Bed Mobility: Supine to Sit     Supine to sit: Min assist     General bed mobility comments: Pt showed good effort in getting to sitting EOB, did need light assist to lift trunk  Transfers Overall transfer level: Needs assistance Equipment used: Rolling walker (2 wheeled) Transfers: Sit to/from Stand Sit to Stand: Min assist         General transfer comment: Pt did well, showed good effort and good relative confidence but did need light assist to achieve upright standing.   Ambulation/Gait Ambulation/Gait assistance: Min assist Ambulation Distance (Feet): 75 Feet Assistive device: Rolling walker (2 wheeled)       General Gait Details: Pt relaint  on UEs most of the effort.  PT did encourage her to walk with just hallway rail ~15 ft and no UEs ~10 ft, but pt more stable and confident with AD.  She had short, choppy, slow ambulation but did not have any LOBs or overt safety issues.  Family reports that her gait is near her baseline.  Stairs            Wheelchair Mobility    Modified Rankin (Stroke Patients Only)       Balance Overall balance assessment: Needs assistance Sitting-balance support: Single extremity supported Sitting balance-Leahy Scale: Fair     Standing balance support: Bilateral upper extremity supported Standing balance-Leahy Scale: Fair                               Pertinent Vitals/Pain Pain Assessment: No/denies pain    Home Living Family/patient expects to be discharged to:: Private residence Living Arrangements: Spouse/significant other Available Help at Discharge: Family;Personal care attendant   Home Access: Ramped entrance       Home Equipment: Walker - 2 wheels;Cane - single point      Prior Function Level of Independence: Needs assistance         Comments: Had just started having aides 5d/wk to assist with ADLs, etc     Hand Dominance        Extremity/Trunk Assessment   Upper Extremity Assessment Upper Extremity Assessment: Defer to OT evaluation  Lower Extremity Assessment Lower Extremity Assessment: Overall WFL for tasks assessed (no overt L sided weakness, grossly functional)       Communication   Communication: No difficulties  Cognition Arousal/Alertness: Awake/alert Behavior During Therapy: WFL for tasks assessed/performed Overall Cognitive Status: History of cognitive impairments - at baseline                                 General Comments: Pt with some confusion, inability to fully follow questionsbut ultimately appropriate and per family near baseline      General Comments      Exercises     Assessment/Plan    PT  Assessment Patient needs continued PT services  PT Problem List Decreased strength;Decreased range of motion;Decreased activity tolerance;Decreased mobility;Decreased balance;Decreased knowledge of use of DME;Decreased safety awareness;Decreased cognition;Cardiopulmonary status limiting activity       PT Treatment Interventions DME instruction;Gait training;Stair training;Functional mobility training;Therapeutic activities;Therapeutic exercise;Balance training;Cognitive remediation;Patient/family education    PT Goals (Current goals can be found in the Care Plan section)  Acute Rehab PT Goals Patient Stated Goal: go home PT Goal Formulation: With patient/family Time For Goal Achievement: 01/25/17 Potential to Achieve Goals: Fair    Frequency Min 2X/week   Barriers to discharge        Co-evaluation               AM-PAC PT "6 Clicks" Daily Activity  Outcome Measure Difficulty turning over in bed (including adjusting bedclothes, sheets and blankets)?: Total Difficulty moving from lying on back to sitting on the side of the bed? : Total Difficulty sitting down on and standing up from a chair with arms (e.g., wheelchair, bedside commode, etc,.)?: Total Help needed moving to and from a bed to chair (including a wheelchair)?: A Little Help needed walking in hospital room?: A Little Help needed climbing 3-5 steps with a railing? : A Lot 6 Click Score: 11    End of Session Equipment Utilized During Treatment: Gait belt Activity Tolerance: Patient tolerated treatment well Patient left: with chair alarm set;with call bell/phone within reach;with family/visitor present Nurse Communication: Mobility status PT Visit Diagnosis: Muscle weakness (generalized) (M62.81);Difficulty in walking, not elsewhere classified (R26.2)    Time: 0935-1001 PT Time Calculation (min) (ACUTE ONLY): 26 min   Charges:   PT Evaluation $PT Eval Low Complexity: 1 Procedure     PT G Codes:   PT G-Codes  **NOT FOR INPATIENT CLASS** Functional Assessment Tool Used: AM-PAC 6 Clicks Basic Mobility Functional Limitation: Mobility: Walking and moving around Mobility: Walking and Moving Around Current Status (D3220): At least 40 percent but less than 60 percent impaired, limited or restricted Mobility: Walking and Moving Around Goal Status [phone]): At least 20 percent but less than 40 percent impaired, limited or restricted    Malachi Pro, DPT 01/11/2017, 12:19 PM

## 2017-01-11 NOTE — Progress Notes (Signed)
SLP Cancellation Note  Patient Details Name: Theresa Frank MRN: [id number] DOB: 1924/11/09   Cancelled treatment:       Reason Eval/Treat Not Completed: SLP screened, no needs identified, will sign off (chart reviewed; consulted NSG/family). Met w/ pt who denied any deficits w/ her speech and language abilities. Pt conversed in conversation w/ no gross, apparent deficits; NSG and family member agreed as well. Pt does have a baseline dx of Alzheimer's Dementia (which can impact linguistic skills overall as disease progresses). Pt denied any difficulty swallowing and is currently on a mech soft diet for easier mastication; wears dentures.  ST services will be available for any further education and assessment if indicated while admitted. NSG agreed.  Orinda Kenner, MS, CCC-SLP [name] 01/11/2017, 5:27 PM

## 2017-01-11 NOTE — Progress Notes (Signed)
Initial Nutrition Assessment  DOCUMENTATION CODES:   Severe malnutrition in context of chronic illness, Underweight  INTERVENTION:  Provide Ensure Enlive po BID, each supplement provides 350 kcal and 20 grams of protein.   Provide multivitamin with minerals daily.  NUTRITION DIAGNOSIS:   Malnutrition (Severe) related to chronic illness (Alzheimer's) as evidenced by 13.3% weight loss over 6 months, severe depletion of body fat, severe depletion of muscle mass.  GOAL:   Patient will meet greater than or equal to 90% of their needs  MONITOR:   PO intake, Supplement acceptance, Labs, Weight trends, I & O's  REASON FOR ASSESSMENT:   Malnutrition Screening Tool    ASSESSMENT:   83 year old female with PMHx of HTN, asthma, hx of MI, diverticulosis, Alzheimer's dementia with behavioral disturbance admitted under observation for syncope pending workup.   Spoke with patient at bedside. Unsure of how detailed of a historian she is. She reports her appetite is getting better now but that it has been poor for a long time. She reports trying to eat three meals per day, but she cannot finish meals. Patient has finished bites of chicken and about 50% of mashed potatoes with gravy on her lunch tray presently in room. She reports she may have some more chicken and will probably finish her tea. Denies any N/V, abdominal pain, difficulty chewing/swallowing, or constipation/diarrhea. Patient reports she enjoys all flavors of Ensure and is willing to drink between meals to help meet calorie/protein needs.  Patient unsure of her UBW but reports she has been losing weight. Per chart she was 119.8 lbs on 05/31/2016 and has lost 15.9 lbs (13.3% body weight) over 6 months, which is significant for time frame.  Medications reviewed and include: Colace, Remeron 7.5 mg daily.  Labs reviewed: BUN 23.   Nutrition-Focused physical exam completed. Findings are severe fat depletion, severe muscle depletion, and  no edema. Patient reports she is currently able to ambulate and does not usually require a walker. With PT today able to ambulate with rolling walker.  Discussed with RN. Poor appetite/intake lately. Patient pending SLP evaluation but did okay with bedside swallow evaluation.  Diet Order:  DIET DYS 3 Room service appropriate? Yes; Fluid consistency: Thin  Skin:  Reviewed, no issues  Last BM:  01/11/2017  Height:   Ht Readings from Last 1 Encounters:  01/11/17 5\' 8"  (1.727 m)    Weight:   Wt Readings from Last 1 Encounters:  01/11/17 103 lb 14.4 oz (47.1 kg)    Ideal Body Weight:  63.6 kg  BMI:  Body mass index is 15.8 kg/m.  Estimated Nutritional Needs:   Kcal:  1135-1325 (MSJ x 1.2-1.4)  Protein:  60-70 grams (1.3-1.5 grams/kg)  Fluid:  1.1 L/day (25 ml/kg)  EDUCATION NEEDS:   No education needs identified at this time  Helane Rima, MS, RD, LDN Pager: [phone] After Hours Pager: [phone]

## 2017-01-11 NOTE — Care Management Obs Status (Signed)
MEDICARE OBSERVATION STATUS NOTIFICATION   Patient Details  Name: Theresa Frank MRN: [id number] Date of Birth: [birth year]   Medicare Observation Status Notification Given:  Yes    Gwenette Greet, RN 01/11/2017, 11:20 AM

## 2017-01-11 NOTE — Evaluation (Signed)
Occupational Therapy Evaluation Patient Details Name: Theresa Frank MRN: [id number] DOB: Jul 26, 1925 Today's Date: 01/11/2017    History of Present Illness 83 y/o female with past medical history of dementia and asthma presents to the emergency department after a fall. She was found at home by her son reports that she seemed to be unconscious at the time.   Clinical Impression   Pt seen for OT evaluation this date. [place] and adult son (recently moved in to assist pt and spouse) with recent home health aide working additional hours (M-F, 4 hours/day) to start assisting pt with bathing (HH aide was coming just for spouse via the [place]). Pt presents with some confusion and near baseline functionally per pt and granddaughter's report during session. Pt with shuffling gait with ambulation, improved balance using RW, but requiring verbal cues for hand placement and technique when turning (pt did not use AD for ambulation at baseline). Pt would benefit from skilled OT services to work on strength, caregiver/pt education/training in AE/DME for ADL, energy conservation, and falls prevention. Recommend home with HHOT and nearby 24/7 supervision/assist initially.    Follow Up Recommendations  Home health OT    Equipment Recommendations  None recommended by OT    Recommendations for Other Services       Precautions / Restrictions Precautions Precautions: Fall Restrictions Weight Bearing Restrictions: No      Mobility Bed Mobility Overal bed mobility: Needs Assistance Bed Mobility: Supine to Sit;Sit to Supine     Supine to sit: Supervision;HOB elevated Sit to supine: Supervision   General bed mobility comments: good effort, use of bedrails, additional time to complete but no physical assist required  Transfers Overall transfer level: Needs assistance Equipment used: Rolling walker (2 wheeled) Transfers: Sit to/from Stand Sit to Stand: Min guard          General transfer comment: Pt did well, showed good effort and good relative confidence but did need light assist to achieve upright standing.     Balance Overall balance assessment: Needs assistance Sitting-balance support: Single extremity supported;Feet supported Sitting balance-Leahy Scale: Good     Standing balance support: Bilateral upper extremity supported Standing balance-Leahy Scale: Fair                             ADL either performed or assessed with clinical judgement   ADL Overall ADL's : Needs assistance/impaired Eating/Feeding: Set up;Sitting   Grooming: Standing;Supervision/safety   Upper Body Bathing: Supervision/ safety;Set up;Sitting   Lower Body Bathing: Supervison/ safety;Set up;Sit to/from stand   Upper Body Dressing : Set up;Sitting   Lower Body Dressing: Supervision/safety;Set up;Sit to/from stand   Toilet Transfer: Min guard;Regular Toilet;Grab bars;Ambulation;RW Toilet Transfer Details (indicate cue type and reason): verbal cues for hand placement on grab bar versus RW  Toileting- Clothing Manipulation and Hygiene: Supervision/safety;Sit to/from stand       Functional mobility during ADLs: Min guard;Rolling walker;Cueing for safety General ADL Comments: pt generally supervision - min guard level for functional mobility and ADL tasks, some verbal cues required for RW, as pt is not used to using at home but does endorse "furniture walking"     Vision   Eye Alignment: Within Functional Limits Ocular Range of Motion: Within Functional Limits Alignment/Gaze Preference: Within Defined Limits Tracking/Visual Pursuits: Able to track stimulus in all quads without difficulty Convergence: Within functional limits     Perception  Praxis      Pertinent Vitals/Pain Pain Assessment: No/denies pain     Hand Dominance     Extremity/Trunk Assessment Upper Extremity Assessment Upper Extremity Assessment: Overall WFL for tasks assessed  (grossly functional, 4/5 bilaterally, good grip strength)   Lower Extremity Assessment Lower Extremity Assessment: Defer to PT evaluation;Overall [facility] for tasks assessed   Cervical / Trunk Assessment Cervical / Trunk Assessment: Normal   Communication Communication Communication: No difficulties   Cognition Arousal/Alertness: Awake/alert Behavior During Therapy: WFL for tasks assessed/performed Overall Cognitive Status: History of cognitive impairments - at baseline                                 General Comments: Pt with some confusion, inability to fully follow questions but ultimately appropriate and per family near baseline   General Comments       Exercises     Shoulder Instructions      Home Living Family/patient expects to be discharged to:: Private residence Living Arrangements: Spouse/significant other;Children (adult son ) Available Help at Discharge: Family;Personal care attendant;Available PRN/intermittently;Available 24 hours/day (family always around on farm) Type of Home: House Home Access: Ramped entrance     Home Layout: One level     Bathroom Shower/Tub: Walk-in shower;Door   Bathroom Toilet: Handicapped height (with BSC overtop or riser, difficult to determine which)     Home Equipment: Walker - 2 wheels;Cane - single point;Grab bars - tub/shower;Shower seat          Prior Functioning/Environment Level of Independence: Needs assistance        Comments: Had just started having aides 5d/wk to assist with bathing, cooking, and cleaning        OT Problem List: Decreased cognition;Decreased knowledge of use of DME or AE;Decreased knowledge of precautions;Decreased activity tolerance;Decreased safety awareness      OT Treatment/Interventions: DME and/or AE instruction;Energy conservation;Patient/family education    OT Goals(Current goals can be found in the care plan section) Acute Rehab OT Goals Patient Stated Goal: go home OT  Goal Formulation: With patient/family Time For Goal Achievement: 01/25/17 Potential to Achieve Goals: Good  OT Frequency: Min 1X/week   Barriers to D/C:            Co-evaluation              AM-PAC PT "6 Clicks" Daily Activity     Outcome Measure Help from another person eating meals?: None Help from another person taking care of personal grooming?: None Help from another person toileting, which includes using toliet, bedpan, or urinal?: A Little Help from another person bathing (including washing, rinsing, drying)?: A Little Help from another person to put on and taking off regular upper body clothing?: None Help from another person to put on and taking off regular lower body clothing?: A Little 6 Click Score: 21   End of Session Equipment Utilized During Treatment: Gait belt;Rolling walker  Activity Tolerance: Patient tolerated treatment well Patient left: in bed;with call bell/phone within reach;with bed alarm set;with family/visitor present  OT Visit Diagnosis: Other abnormalities of gait and mobility (R26.89);Other symptoms and signs involving cognitive function;History of falling (Z91.81)                Time: 4098-1191 OT Time Calculation (min): 38 min Charges:  OT General Charges $OT Visit: 1 Procedure OT Evaluation $OT Eval Low Complexity: 1 Procedure OT Treatments $Self Care/Home Management : 23-37 mins G-Codes:  OT G-codes **NOT FOR INPATIENT CLASS** Functional Assessment Tool Used: AM-PAC 6 Clicks Daily Activity;Clinical judgement Functional Limitation: Self care Self Care Current Status (Z6109): At least 20 percent but less than 40 percent impaired, limited or restricted Self Care Goal Status (U0454): At least 20 percent but less than 40 percent impaired, limited or restricted   Richrd Prime, MPH, MS, OTR/L ascom [phone] 01/11/17, 2:17 PM

## 2017-01-11 NOTE — Progress Notes (Signed)
Unable to complete admission history pt is confused.

## 2017-01-11 NOTE — ED Notes (Signed)
Fleet Contras RN helped the Pt to the bedside commode. Pt returned to her bed without difficulty.

## 2017-01-11 NOTE — H&P (Signed)
Theresa Frank is an 83 y.o. female.   Chief Complaint: Fall HPI: The patient with past medical history of dementia and asthma presents to the emergency department after a fall. She was found at home by her son reports that she seemed to be unconscious at the time. She was easily arousable but seems to be weak on her left side. There is some question of a facial droop and/or slurred speech however NIH scale performed by EMS was 0. Upon arrival to the emergency department the patient had no neurologic deficits. She cannot contribute much to her own history as she thinks that she fell a few days ago which I cannot confirm, but she does not recall falling tonight. She denies pain anywhere and denies palpitations, shortness of breath, nausea, vomiting or diaphoresis. She denies weakness although her family reported that she seemed weak after today's incident. CT of her head showed only soft tissue swelling of the scalp along her posterior parietal area as well as chronic bilateral lacunar infarcts and cortical volume loss consistent with age and dementia. As there is a question regarding mechanical fall versus syncope emergency department staff called the hospitalist service for admission.  Past Medical History:  Diagnosis Date  . Alzheimer's dementia, late onset, with behavioral disturbance   . Asthma   . Colon polyps   . Diverticulosis   . Hypertension   . MI (myocardial infarction) [facility])     Past Surgical History:  Procedure Laterality Date  . ABDOMINAL HYSTERECTOMY    . HERNIA REPAIR      Family History  Problem Relation Age of Onset  . Stomach cancer Father    Social History:  reports that she has never smoked. She does not have any smokeless tobacco history on file. She reports that she does not drink alcohol or use drugs.  Allergies:  Allergies  Allergen Reactions  . Ciprofloxacin   . Penicillins   . Premarin [Conjugated Estrogens]   . Septra [Sulfamethoxazole-Trimethoprim]   .  Zocor [Simvastatin]     Prior to Admission medications   Medication Sig Start Date End Date Taking? Authorizing Provider  donepezil (ARICEPT) 5 MG tablet Take 5 mg by mouth at bedtime.   Yes [provider]  Fluticasone-Salmeterol (ADVAIR DISKUS) 250-50 MCG/DOSE AEPB Inhale 1 puff into the lungs 2 (two) times daily.   Yes [provider]  memantine (NAMENDA) 10 MG tablet Take 10 mg by mouth 2 (two) times daily.   Yes [provider]  mirtazapine (REMERON) 15 MG tablet Take 7.5 mg by mouth at bedtime.   Yes [provider]  montelukast (SINGULAIR) 10 MG tablet Take 10 mg by mouth daily.   Yes [provider]     Results for orders placed or performed during the hospital encounter of 01/10/17 (from the past 48 hour(s))  Basic metabolic panel     Status: Abnormal   Collection Time: 01/10/17 11:58 PM  Result Value Ref Range   Sodium 137 135 - 145 mmol/L   Potassium 4.1 3.5 - 5.1 mmol/L   Chloride 103 101 - 111 mmol/L   CO2 28 22 - 32 mmol/L   Glucose, Bld 107 (H) 65 - 99 mg/dL   BUN 23 (H) 6 - 20 mg/dL   Creatinine, Ser 0.95 0.44 - 1.00 mg/dL   Calcium 9.3 8.9 - 10.3 mg/dL   GFR calc non Af Amer 51 (L) >60 mL/min   GFR calc Af Amer 59 (L) >60 mL/min  Comment: (NOTE) The eGFR has been calculated using the CKD EPI equation. This calculation has not been validated in all clinical situations. eGFR's persistently <60 mL/min signify possible Chronic Kidney Disease.    Anion gap 6 5 - 15  CBC     Status: Abnormal   Collection Time: 01/10/17 11:58 PM  Result Value Ref Range   WBC 6.7 3.6 - 11.0 K/uL   RBC 3.87 3.80 - 5.20 MIL/uL   Hemoglobin 11.8 (L) 12.0 - 16.0 g/dL   HCT 34.6 (L) 35.0 - 47.0 %   MCV 89.4 80.0 - 100.0 fL   MCH 30.5 26.0 - 34.0 pg   MCHC 34.2 32.0 - 36.0 g/dL   RDW 13.9 11.5 - 14.5 %   Platelets 308 150 - 440 K/uL  Troponin I     Status: None   Collection Time: 01/10/17 11:58 PM  Result Value Ref Range   Troponin I  <0.03 <0.03 ng/mL  Urinalysis, Complete w Microscopic     Status: Abnormal   Collection Time: 01/10/17 11:58 PM  Result Value Ref Range   Color, Urine YELLOW (A) YELLOW   APPearance CLEAR (A) CLEAR   Specific Gravity, Urine 1.014 1.005 - 1.030   pH 5.0 5.0 - 8.0   Glucose, UA NEGATIVE NEGATIVE mg/dL   Hgb urine dipstick NEGATIVE NEGATIVE   Bilirubin Urine NEGATIVE NEGATIVE   Ketones, ur NEGATIVE NEGATIVE mg/dL   Protein, ur NEGATIVE NEGATIVE mg/dL   Nitrite NEGATIVE NEGATIVE   Leukocytes, UA NEGATIVE NEGATIVE   RBC / HPF NONE SEEN 0 - 5 RBC/hpf   WBC, UA 0-5 0 - 5 WBC/hpf   Bacteria, UA NONE SEEN NONE SEEN   Squamous Epithelial / LPF 0-5 (A) NONE SEEN   Mucous PRESENT    Ct Head Wo Contrast  Result Date: 01/11/2017 CLINICAL DATA:  Status post fall, with confusion. Concern for head injury. Initial encounter. EXAM: CT HEAD WITHOUT CONTRAST TECHNIQUE: Contiguous axial images were obtained from the base of the skull through the vertex without intravenous contrast. COMPARISON:  CT of the head performed 06/08/2016 FINDINGS: Brain: No evidence of acute infarction, hemorrhage, hydrocephalus, extra-axial collection or mass lesion/mass effect. Prominence of the ventricles and sulci reflects mild to moderate cortical volume loss. Cerebellar atrophy is noted. Scattered periventricular and subcortical white matter change likely reflects small vessel ischemic microangiopathy. Chronic lacunar infarcts are noted at the basal ganglia bilaterally. The brainstem and fourth ventricle are within normal limits. The cerebral hemispheres demonstrate grossly normal gray-white differentiation. No mass effect or midline shift is seen. Vascular: No hyperdense vessel or unexpected calcification. Skull: There is no evidence of fracture; visualized osseous structures are unremarkable in appearance. Sinuses/Orbits: The visualized portions of the orbits are within normal limits. The paranasal sinuses and mastoid air cells are  well-aerated. Other: Mild soft tissue swelling is noted overlying the left posterior parietal calvarium. IMPRESSION: 1. No evidence of traumatic intracranial injury or fracture. 2. Mild soft tissue swelling overlying the left posterior parietal calvarium. 3. Mild to moderate cortical volume loss and scattered small vessel ischemic microangiopathy. 4. Chronic lacunar infarcts at the basal ganglia bilaterally. Electronically Signed   By: Garald Balding M.D.   On: 01/11/2017 00:10   Dg Chest Port 1 View  Result Date: 01/11/2017 CLINICAL DATA:  Syncope today. EXAM: PORTABLE CHEST 1 VIEW COMPARISON:  Report from chest radiographs 10/21/2016, 06/10/2014, images not available. FINDINGS: Mild cardiomegaly, normal mediastinal contours. Atherosclerosis of the aortic arch. Small left pleural effusion. No evidence pulmonary  edema. No confluent airspace disease. No pneumothorax. No acute osseous abnormalities are seen. IMPRESSION: Mild cardiomegaly, atherosclerosis of the thoracic aorta. Cardiomegaly was not described on prior exam. Small left pleural effusion. No pulmonary edema or findings of congestive failure. Electronically Signed   By: Jeb Levering M.D.   On: 01/11/2017 00:04    Review of Systems  Constitutional: Negative for chills and fever.  HENT: Negative for sore throat and tinnitus.   Eyes: Negative for blurred vision and redness.  Respiratory: Negative for cough and shortness of breath.   Cardiovascular: Negative for chest pain, palpitations, orthopnea and PND.  Gastrointestinal: Negative for abdominal pain, diarrhea, nausea and vomiting.  Genitourinary: Negative for dysuria, frequency and urgency.  Musculoskeletal: Positive for falls. Negative for joint pain and myalgias.  Skin: Negative for rash.       No lesions  Neurological: Negative for speech change, focal weakness and weakness.  Endo/Heme/Allergies: Does not bruise/bleed easily.       No temperature intolerance   Psychiatric/Behavioral: Negative for depression and suicidal ideas.    Blood pressure 107/84, pulse 65, temperature 98.4 F (36.9 C), resp. rate 18, weight 51.6 kg (113 lb 11.2 oz), SpO2 98 %. Physical Exam  Vitals reviewed. Constitutional: She is oriented to person, place, and time. She appears well-developed and well-nourished.  HENT:  Head: Normocephalic and atraumatic.  Mouth/Throat: Oropharynx is clear and moist.  Eyes: Conjunctivae and EOM are normal. Pupils are equal, round, and reactive to light. No scleral icterus.  Neck: Normal range of motion. Neck supple. No JVD present. Carotid bruit is present (right). No tracheal deviation present. No thyromegaly present.  Cardiovascular: Normal rate and regular rhythm.  Exam reveals no gallop and no friction rub.   Murmur heard.  Systolic murmur is present with a grade of 2/6  Pulses:      Carotid pulses are 1+ on the right side. Murmur heard best over aortic valve area  Respiratory: Effort normal and breath sounds normal.  GI: Soft. Bowel sounds are normal. She exhibits no distension. There is no tenderness.  Genitourinary:  Genitourinary Comments: Deferred  Musculoskeletal: Normal range of motion. She exhibits no edema.  Lymphadenopathy:    She has no cervical adenopathy.  Neurological: She is alert and oriented to person, place, and time. No cranial nerve deficit. She exhibits normal muscle tone.  Skin: Skin is warm and dry. No rash noted. No erythema.  Psychiatric: She has a normal mood and affect. Her behavior is normal. Judgment and thought content normal.     Assessment/Plan This is a 83 year old female admitted for syncope. 1. Syncope: Rule out cardiogenic etiology. TIA is part of differential diagnosis. The patient has no neurologic deficits at this time. Physical exam is significant for a right sided carotid bruit as well as a murmur potentially consistent with aortic stenosis. I have ordered an echocardiogram as well as  Doppler ultrasounds of her carotid arteries. We will defer MRI of the brain for now. Consult cardiology and neurology. I have deferred aspirin as the patient is a fall risk. 2. Dementia: Contributes to shuffling gait and unsteadiness. Continue Aricept, Namenda and Remeron. 3. Asthma: Controlled; continue inhaled corticosteroid 4. Falls: Secondary to dementia. 5. DVT prophylaxis: Heparin 6. GI prophylaxis: None The patient is a full code. Time spent on admission orders and patient care approximately 45 minutes  Harrie Foreman, MD 01/11/2017, 4:32 AM

## 2017-01-11 NOTE — Progress Notes (Signed)
*  PRELIMINARY RESULTS* Echocardiogram 2D Echocardiogram has been performed.  Theresa Frank 01/11/2017, 2:53 PM

## 2017-01-12 ENCOUNTER — Observation Stay: Payer: Medicare Other

## 2017-01-12 ENCOUNTER — Encounter: Payer: Self-pay | Admitting: Internal Medicine

## 2017-01-12 DIAGNOSIS — E756 Lipid storage disorder, unspecified: Secondary | ICD-10-CM | POA: Diagnosis not present

## 2017-01-12 LAB — LIPID PANEL
CHOL/HDL RATIO: 3.6 ratio
CHOLESTEROL: 122 mg/dL (ref 0–200)
HDL: 34 mg/dL — AB (ref >40)
LDL Cholesterol: 75 mg/dL (ref 0–99)
TRIGLYCERIDES: 66 mg/dL (ref <150)
VLDL: 13 mg/dL (ref 0–40)

## 2017-01-12 LAB — HEMOGLOBIN A1C
Hgb A1c MFr Bld: 5.4 % (ref 4.8–5.6)
Mean Plasma Glucose: 108 mg/dL

## 2017-01-12 MED ORDER — ASPIRIN 81 MG PO TBEC: 81.0000 mg | DELAYED_RELEASE_TABLET | Freq: Every day | ORAL | Status: AC

## 2017-01-12 NOTE — Discharge Instructions (Signed)
Resume diet and activity as before ° ° °

## 2017-01-12 NOTE — Progress Notes (Addendum)
Subjective: Patient reports not new neurological changes.    Objective: Current vital signs: BP 128/62 (BP Location: Right Arm)   Pulse 74   Temp 98.8 F (37.1 C)   Resp 16   Ht 5\' 8"  (1.727 m)   Wt 48.1 kg (106 lb)   SpO2 96%   BMI 16.12 kg/m  Vital signs in last 24 hours: Temp:  [98 F (36.7 C)-100 F (37.8 C)] 98.8 F (37.1 C) (05/09 0800) Pulse Rate:  [55-74] 74 (05/09 0800) Resp:  [14-20] 16 (05/09 0800) BP: (123-148)/(62-70) 128/62 (05/09 0800) SpO2:  [96 %-98 %] 96 % (05/09 0800) Weight:  [48.1 kg (106 lb)] 48.1 kg (106 lb) (05/09 0500)  Intake/Output from previous day: 05/08 0701 - 05/09 0700 In: -  Out: 125 [Urine:125] Intake/Output this shift: Total I/O In: 480 [P.O.:480] Out: -  Nutritional status: DIET DYS 3 Room service appropriate? Yes; Fluid consistency: Thin  Neurologic Exam: Mental Status: Alert, oriented to name but not to place or year, thought content appropriate.  Speech fluent without evidence of aphasia.  Able to follow 3 step commands with some need of reinforcement. Cranial Nerves: II: Discs flat bilaterally; Visual fields grossly normal, pupils equal, round, reactive to light and accommodation III,IV, VI: ptosis not present, extra-ocular motions intact bilaterally V,VII: mild left facial droop, facial light touch sensation normal bilaterally VIII: hearing normal bilaterally IX,X: gag reflex present XI: bilateral shoulder shrug XII: midline tongue extension Motor: Right :  Upper extremity   5/5                                      Left:     Upper extremity   5-/5 with drift             Lower extremity   5/5                                                  Lower extremity   4+/5 with external rotation Tone and bulk:normal tone throughout; no atrophy noted Sensory: Pinprick and light touch intact throughout, bilaterally Deep Tendon Reflexes: 1+ and symmetric with absent AJ's bilaterally   Lab Results: Basic Metabolic Panel:  Recent  Labs Lab 01/10/17 2358  NA 137  K 4.1  CL 103  CO2 28  GLUCOSE 107*  BUN 23*  CREATININE 0.95  CALCIUM 9.3    Liver Function Tests: No results for input(s): AST, ALT, ALKPHOS, BILITOT, PROT, ALBUMIN in the last 168 hours. No results for input(s): LIPASE, AMYLASE in the last 168 hours. No results for input(s): AMMONIA in the last 168 hours.  CBC:  Recent Labs Lab 01/10/17 2358  WBC 6.7  HGB 11.8*  HCT 34.6*  MCV 89.4  PLT 308    Cardiac Enzymes:  Recent Labs Lab 01/10/17 2358  TROPONINI <0.03    Lipid Panel:  Recent Labs Lab 01/12/17 0413  CHOL 122  TRIG 66  HDL 34*  CHOLHDL 3.6  VLDL 13  LDLCALC 75    CBG: No results for input(s): GLUCAP in the last 168 hours.  Microbiology: No results found for this or any previous visit.  Coagulation Studies: No results for input(s): LABPROT, INR in the last 72 hours.  Imaging: Dg Abd 1 View  Result  Date: 01/12/2017 CLINICAL DATA:  Patient for MRI.  Clearing. EXAM: ABDOMEN - 1 VIEW COMPARISON:  None. FINDINGS: Extensive vascular calcifications are present. The bowel gas pattern is normal. Markers associated with ventral hernia repair over the pelvis are noted. No other radiopaque foreign body is present. The left hemidiaphragm is elevated. IMPRESSION: 1. Ventral hernia repair with metallic markers, likely MRI compatible. 2. No other radiopaque metallic foreign body. 3. Extensive atherosclerotic disease without aneurysm. 4. Elevated left hemidiaphragm. Electronically Signed   By: Marin Roberts M.D.   On: 01/12/2017 10:08   Ct Head Wo Contrast  Result Date: 01/11/2017 CLINICAL DATA:  Status post fall, with confusion. Concern for head injury. Initial encounter. EXAM: CT HEAD WITHOUT CONTRAST TECHNIQUE: Contiguous axial images were obtained from the base of the skull through the vertex without intravenous contrast. COMPARISON:  CT of the head performed 06/08/2016 FINDINGS: Brain: No evidence of acute infarction,  hemorrhage, hydrocephalus, extra-axial collection or mass lesion/mass effect. Prominence of the ventricles and sulci reflects mild to moderate cortical volume loss. Cerebellar atrophy is noted. Scattered periventricular and subcortical white matter change likely reflects small vessel ischemic microangiopathy. Chronic lacunar infarcts are noted at the basal ganglia bilaterally. The brainstem and fourth ventricle are within normal limits. The cerebral hemispheres demonstrate grossly normal gray-white differentiation. No mass effect or midline shift is seen. Vascular: No hyperdense vessel or unexpected calcification. Skull: There is no evidence of fracture; visualized osseous structures are unremarkable in appearance. Sinuses/Orbits: The visualized portions of the orbits are within normal limits. The paranasal sinuses and mastoid air cells are well-aerated. Other: Mild soft tissue swelling is noted overlying the left posterior parietal calvarium. IMPRESSION: 1. No evidence of traumatic intracranial injury or fracture. 2. Mild soft tissue swelling overlying the left posterior parietal calvarium. 3. Mild to moderate cortical volume loss and scattered small vessel ischemic microangiopathy. 4. Chronic lacunar infarcts at the basal ganglia bilaterally. Electronically Signed   By: Roanna Raider M.D.   On: 01/11/2017 00:10   Mr Shirlee Latch ZO Contrast  Result Date: 01/12/2017 CLINICAL DATA:  Left-sided weakness EXAM: MRI HEAD WITHOUT CONTRAST MRA HEAD WITHOUT CONTRAST TECHNIQUE: Multiplanar, multiecho pulse sequences of the brain and surrounding structures were obtained without intravenous contrast. Angiographic images of the head were obtained using MRA technique without contrast. COMPARISON:  CT head 01/11/2017 FINDINGS: MRI HEAD FINDINGS Brain: Patchy areas of acute infarct in the right frontal convexity cortex and adjacent white matter. No associated hemorrhage. No other acute infarct Moderate to advanced atrophy.  Chronic microvascular ischemic changes in the white matter bilaterally. Chronic infarct right cerebellum. Negative for mass or edema or midline shift. Vascular: Normal arterial flow voids Skull and upper cervical spine: Negative Sinuses/Orbits: Air-fluid level right maxillary sinus. Mucosal edema in the left sphenoid sinus. Right cataract removal. Other: None MRA HEAD FINDINGS Suboptimal image quality due to technical factors Both vertebral arteries are patent. Decreased signal in the proximal basilar likely is artifactual. Fetal origin left posterior cerebral artery. Both posterior cerebral arteries patent but not well evaluated distally. Cavernous carotid patent bilaterally. Decreased signal in the anterior middle cerebral arteries due to artifact. These vessels are patent but not well evaluated. 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 IMPRESSION: Acute nonhemorrhagic infarct right frontal lobe Atrophy and chronic microvascular ischemia MRA is limited due to technical artifact. 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 Electronically Signed   By: Marlan Palau M.D.   On: 01/12/2017 11:37   Mr Brain Wo Contrast  Result Date: 01/12/2017 CLINICAL DATA:  Left-sided weakness EXAM: MRI HEAD WITHOUT CONTRAST MRA HEAD WITHOUT CONTRAST TECHNIQUE: Multiplanar, multiecho pulse sequences of the brain and surrounding structures were obtained without intravenous contrast. Angiographic images of the head were obtained using MRA technique without contrast. COMPARISON:  CT head 01/11/2017 FINDINGS: MRI HEAD FINDINGS Brain: Patchy areas of acute infarct in the right frontal convexity cortex and adjacent white matter. No associated hemorrhage. No other acute infarct Moderate to advanced atrophy. Chronic microvascular ischemic changes in the white matter bilaterally. Chronic infarct right cerebellum. Negative for mass or edema or midline shift. Vascular: Normal arterial flow voids Skull and upper  cervical spine: Negative Sinuses/Orbits: Air-fluid level right maxillary sinus. Mucosal edema in the left sphenoid sinus. Right cataract removal. Other: None MRA HEAD FINDINGS Suboptimal image quality due to technical factors Both vertebral arteries are patent. Decreased signal in the proximal basilar likely is artifactual. Fetal origin left posterior cerebral artery. Both posterior cerebral arteries patent but not well evaluated distally. Cavernous carotid patent bilaterally. Decreased signal in the anterior middle cerebral arteries due to artifact. These vessels are patent but not well evaluated. 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 IMPRESSION: Acute nonhemorrhagic infarct right frontal lobe Atrophy and chronic microvascular ischemia MRA is limited due to technical artifact. 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 Electronically Signed   By: Marlan Palau M.D.   On: 01/12/2017 11:37   US Carotid Bilateral (at [facility] Only)  Result Date: 01/11/2017 CLINICAL DATA:  TIA. EXAM: BILATERAL CAROTID DUPLEX ULTRASOUND TECHNIQUE: Wallace Cullens scale imaging, color Doppler and duplex ultrasound were performed of bilateral carotid and vertebral arteries in the neck. COMPARISON:  None. FINDINGS: Criteria: Quantification of carotid stenosis is based on velocity parameters that correlate the residual internal carotid diameter with NASCET-based stenosis levels, using the diameter of the distal internal carotid lumen as the denominator for stenosis measurement. The following velocity measurements were obtained: RIGHT ICA:  65 cm/sec CCA:  68 cm/sec SYSTOLIC ICA/CCA RATIO:  1.0 DIASTOLIC ICA/CCA RATIO:  1.2 ECA:  75 cm/sec LEFT ICA:  62 cm/sec CCA:  80 cm/sec SYSTOLIC ICA/CCA RATIO:  0.8 DIASTOLIC ICA/CCA RATIO:  2.4 ECA:  109 cm/sec RIGHT CAROTID ARTERY: Atherosclerotic plaque throughout the right common carotid artery. Echogenic plaque at the right carotid bulb. External carotid artery is patent  with normal waveform. Normal waveforms and velocities in the internal carotid artery. RIGHT VERTEBRAL ARTERY: Antegrade flow and normal waveform in the right vertebral artery. LEFT CAROTID ARTERY: Intimal thickening in the left common carotid artery. Mild plaque at the left carotid bulb. External carotid artery is patent with normal waveform. Normal waveforms and velocities in the internal carotid artery. LEFT VERTEBRAL ARTERY: Antegrade flow and normal waveform in the left vertebral artery. IMPRESSION: Mild atherosclerotic disease in the bilateral carotid arteries. Estimated degree of stenosis in the internal carotid arteries is less than 50% bilaterally. Patent vertebral arteries with antegrade flow. Electronically Signed   By: Richarda Overlie M.D.   On: 01/11/2017 11:07   Dg Chest Port 1 View  Result Date: 01/11/2017 CLINICAL DATA:  Syncope today. EXAM: PORTABLE CHEST 1 VIEW COMPARISON:  Report from chest radiographs 10/21/2016, 06/10/2014, images not available. FINDINGS: Mild cardiomegaly, normal mediastinal contours. Atherosclerosis of the aortic arch. Small left pleural effusion. No evidence pulmonary edema. No confluent airspace disease. No pneumothorax. No acute osseous abnormalities are seen. IMPRESSION: Mild cardiomegaly, atherosclerosis of the thoracic aorta. Cardiomegaly was not described on prior exam. Small left pleural effusion.  No pulmonary edema or findings of congestive failure. Electronically Signed   By: Rubye Oaks M.D.   On: 01/11/2017 00:04    Medications:  I have reviewed the patient's current medications. Scheduled: . aspirin EC  81 mg Oral Daily  . docusate sodium  100 mg Oral BID  . donepezil  5 mg Oral QHS  . feeding supplement (ENSURE ENLIVE)  237 mL Oral BID BM  . heparin  5,000 Units Subcutaneous Q8H  . memantine  10 mg Oral BID  . mirtazapine  7.5 mg Oral QHS  . mometasone-formoterol  2 puff Inhalation BID  . montelukast  10 mg Oral QHS  . multivitamin with minerals   1 tablet Oral Daily    Assessment/Plan: Patient stable.  MRI of the brain reviewed and shows small patchy acute infarcts in the right frontal white matter.  Etiology suspected to be artery to artery embolus.  A1c 5.4.  LDL 75.  Echocardiogram shows no cardiac source of emboli with an EF of 50-55%.    Recommendations: 1.  Continue ASA 2.  Continue therapy   LOS: 0 days   Thana Farr, MD Neurology [phone] 01/12/2017  1:13 PM

## 2017-01-12 NOTE — Progress Notes (Signed)
Patient stable and ready for discharge. Patient's grand-daughter, Neysa Bonito, at bedside to review discharge packet and transport patient home.

## 2017-01-24 NOTE — Discharge Summary (Signed)
[facility] - [place] at [facility]   PATIENT NAME: Theresa Frank    MR#:  [id number]  DATE OF BIRTH:  Jun 24, 1925  DATE OF ADMISSION:  01/10/2017 ADMITTING PHYSICIAN: Arnaldo Natal, MD  DATE OF DISCHARGE: 01/12/2017  2:30 PM  PRIMARY CARE PHYSICIAN: Lonell Face, MD   ADMISSION DIAGNOSIS:  Weakness  DISCHARGE DIAGNOSIS:  Principal Problem:   TIA (transient ischemic attack) Active Problems:   HTN (hypertension)   Asthma   Alzheimer disease   Fall   SECONDARY DIAGNOSIS:   Past Medical History:  Diagnosis Date  . Alzheimer's dementia, late onset, with behavioral disturbance   . Asthma   . Colon polyps   . Diverticulosis   . Hypertension   . MI (myocardial infarction) [facility])      ADMITTING HISTORY  Chief Complaint: Fall HPI: The patient with past medical history of dementia and asthma presents to the emergency department after a fall. She was found at home by her son reports that she seemed to be unconscious at the time. She was easily arousable but seems to be weak on her left side. There is some question of a facial droop and/or slurred speech however NIH scale performed by EMS was 0. Upon arrival to the emergency department the patient had no neurologic deficits. She cannot contribute much to her own history as she thinks that she fell a few days ago which I cannot confirm, but she does not recall falling tonight. She denies pain anywhere and denies palpitations, shortness of breath, nausea, vomiting or diaphoresis. She denies weakness although her family reported that she seemed weak after today's incident. CT of her head showed only soft tissue swelling of the scalp along her posterior parietal area as well as chronic bilateral lacunar infarcts and cortical volume loss consistent with age and dementia. As there is a question regarding mechanical fall versus syncope emergency department staff called the hospitalist service for admission.  HOSPITAL COURSE:    * Acute nonhemorrhagic infarct right frontal lobe Patient was admitted onto medical floor with telemetry monitoring. Every 4 hours neuro checks. Stroke swallow screen was done prior to starting a diet. He was seen by physical therapy and occupational therapy recommended home with home health services. Started on aspirin, statin. Patient was seen by neurology. Carotid Dopplers and echocardiogram were recommended which showed nothing acute.  Patient's symptoms are slowly improving. Case discussed with family. Discharge home with home health services.   CONSULTS OBTAINED:  Treatment Team:  Kym Groom, MD Thana Farr, MD  DRUG ALLERGIES:   Allergies  Allergen Reactions  . Ciprofloxacin   . Penicillins   . Premarin [Conjugated Estrogens]   . Septra [Sulfamethoxazole-Trimethoprim]   . Zocor [Simvastatin]     DISCHARGE MEDICATIONS:   Discharge Medication List as of 01/12/2017  2:05 PM    START taking these medications   Details  aspirin EC 81 MG EC tablet Take 1 tablet (81 mg total) by mouth daily., Starting Thu 01/13/2017, No Print      CONTINUE these medications which have NOT CHANGED   Details  donepezil (ARICEPT) 5 MG tablet Take 5 mg by mouth at bedtime., Historical Med    Fluticasone-Salmeterol (ADVAIR DISKUS) 250-50 MCG/DOSE AEPB Inhale 1 puff into the lungs 2 (two) times daily., Historical Med    memantine (NAMENDA) 10 MG tablet Take 10 mg by mouth 2 (two) times daily., Historical Med    mirtazapine (REMERON) 15 MG tablet Take 7.5 mg by mouth at bedtime.,  Historical Med    montelukast (SINGULAIR) 10 MG tablet Take 10 mg by mouth daily., Historical Med        Today   VITAL SIGNS:  Blood pressure 128/62, pulse 74, temperature 98.8 F (37.1 C), resp. rate 16, height 5\' 8"  (1.727 m), weight 48.1 kg (106 lb), SpO2 96 %.  I/O:  No intake or output data in the 24 hours ending 01/24/17 1335  PHYSICAL EXAMINATION:  Physical Exam  GENERAL:  83  y.o.-year-old patient lying in the bed with no acute distress.  LUNGS: Normal breath sounds bilaterally, no wheezing, rales,rhonchi or crepitation. No use of accessory muscles of respiration.  CARDIOVASCULAR: S1, S2 normal. No murmurs, rubs, or gallops.  ABDOMEN: Soft, non-tender, non-distended. Bowel sounds present. No organomegaly or mass.  NEUROLOGIC: Moves all 4 extremities. PSYCHIATRIC: The patient is alert and awake. Presently confused SKIN: No obvious rash, lesion, or ulcer.   DATA REVIEW:   CBC No results for input(s): WBC, HGB, HCT, PLT in the last 168 hours.  Chemistries  No results for input(s): NA, K, CL, CO2, GLUCOSE, BUN, CREATININE, CALCIUM, MG, AST, ALT, ALKPHOS, BILITOT in the last 168 hours.  Invalid input(s): GFRCGP  Cardiac Enzymes No results for input(s): TROPONINI in the last 168 hours.  Microbiology Results  No results found for this or any previous visit.  RADIOLOGY:  No results found.  Follow up with PCP in 1 week.  Management plans discussed with the patient, family and they are in agreement.  CODE STATUS:  Code Status History    Date Active Date Inactive Code Status Order ID Comments User Context   01/11/2017  5:02 AM 01/12/2017  5:51 PM Full Code [id number]  Arnaldo Natal, MD Inpatient    Advance Directive Documentation     Most Recent Value  Type of Advance Directive  Healthcare Power of Attorney  Pre-existing out of facility DNR order (yellow form or pink MOST form)  -  "MOST" Form in Place?  -      TOTAL TIME TAKING CARE OF THIS PATIENT ON DAY OF DISCHARGE: more than 30 minutes.   Milagros Loll R M.D on 01/24/2017 at 1:35 PM  Between 7am to 6pm - Pager - [phone]  After 6pm go to [email or web address] - password [email or web address] [facility]  [facility] [place] [facility]  Office  [phone]  CC: Primary care physician; Lonell Face, MD  Note: This dictation was prepared with Dragon dictation along with smaller phrase technology. Any transcriptional  errors that result from this process are unintentional.

## 2017-02-04 ENCOUNTER — Other Ambulatory Visit: Payer: Self-pay

## 2017-02-04 ENCOUNTER — Encounter: Payer: Self-pay | Admitting: Emergency Medicine

## 2017-02-04 ENCOUNTER — Emergency Department: Payer: Medicare Other

## 2017-02-04 ENCOUNTER — Observation Stay
Admission: EM | Admit: 2017-02-04 | Discharge: 2017-02-07 | Disposition: A | Discharge status: Skilled Nursing Facility | Payer: Medicare Other | Attending: Internal Medicine | Admitting: Internal Medicine

## 2017-02-04 DIAGNOSIS — Z881 Allergy status to other antibiotic agents status: Secondary | ICD-10-CM | POA: Insufficient documentation

## 2017-02-04 DIAGNOSIS — Y9301 Activity, walking, marching and hiking: Secondary | ICD-10-CM | POA: Diagnosis not present

## 2017-02-04 DIAGNOSIS — W19XXXA Unspecified fall, initial encounter: Secondary | ICD-10-CM

## 2017-02-04 DIAGNOSIS — G301 Alzheimer's disease with late onset: Secondary | ICD-10-CM | POA: Diagnosis not present

## 2017-02-04 DIAGNOSIS — Z888 Allergy status to other drugs, medicaments and biological substances status: Secondary | ICD-10-CM | POA: Diagnosis not present

## 2017-02-04 DIAGNOSIS — S329XXA Fracture of unspecified parts of lumbosacral spine and pelvis, initial encounter for closed fracture: Secondary | ICD-10-CM | POA: Diagnosis present

## 2017-02-04 DIAGNOSIS — Z86010 Personal history of colonic polyps: Secondary | ICD-10-CM | POA: Insufficient documentation

## 2017-02-04 DIAGNOSIS — W010XXA Fall on same level from slipping, tripping and stumbling without subsequent striking against object, initial encounter: Secondary | ICD-10-CM | POA: Diagnosis not present

## 2017-02-04 DIAGNOSIS — R7989 Other specified abnormal findings of blood chemistry: Secondary | ICD-10-CM | POA: Diagnosis not present

## 2017-02-04 DIAGNOSIS — Y9289 Other specified places as the place of occurrence of the external cause: Secondary | ICD-10-CM | POA: Insufficient documentation

## 2017-02-04 DIAGNOSIS — L8990 Pressure ulcer of unspecified site, unspecified stage: Secondary | ICD-10-CM | POA: Insufficient documentation

## 2017-02-04 DIAGNOSIS — J45909 Unspecified asthma, uncomplicated: Secondary | ICD-10-CM | POA: Diagnosis not present

## 2017-02-04 DIAGNOSIS — G309 Alzheimer's disease, unspecified: Secondary | ICD-10-CM

## 2017-02-04 DIAGNOSIS — I252 Old myocardial infarction: Secondary | ICD-10-CM | POA: Insufficient documentation

## 2017-02-04 DIAGNOSIS — Z7982 Long term (current) use of aspirin: Secondary | ICD-10-CM | POA: Insufficient documentation

## 2017-02-04 DIAGNOSIS — S32810A Multiple fractures of pelvis with stable disruption of pelvic ring, initial encounter for closed fracture: Secondary | ICD-10-CM | POA: Diagnosis present

## 2017-02-04 DIAGNOSIS — I10 Essential (primary) hypertension: Secondary | ICD-10-CM | POA: Diagnosis not present

## 2017-02-04 DIAGNOSIS — Z880 Allergy status to penicillin: Secondary | ICD-10-CM | POA: Insufficient documentation

## 2017-02-04 DIAGNOSIS — Z8719 Personal history of other diseases of the digestive system: Secondary | ICD-10-CM | POA: Diagnosis not present

## 2017-02-04 DIAGNOSIS — F0280 Dementia in other diseases classified elsewhere without behavioral disturbance: Secondary | ICD-10-CM | POA: Diagnosis not present

## 2017-02-04 DIAGNOSIS — Z8673 Personal history of transient ischemic attack (TIA), and cerebral infarction without residual deficits: Secondary | ICD-10-CM | POA: Diagnosis not present

## 2017-02-04 DIAGNOSIS — Z800 Family history of malignant neoplasm of digestive organs: Secondary | ICD-10-CM | POA: Diagnosis not present

## 2017-02-04 DIAGNOSIS — Z79899 Other long term (current) drug therapy: Secondary | ICD-10-CM | POA: Diagnosis not present

## 2017-02-04 DIAGNOSIS — Y998 Other external cause status: Secondary | ICD-10-CM | POA: Insufficient documentation

## 2017-02-04 DIAGNOSIS — R778 Other specified abnormalities of plasma proteins: Secondary | ICD-10-CM | POA: Diagnosis present

## 2017-02-04 LAB — CBC WITH DIFFERENTIAL/PLATELET
Basophils Absolute: 0.1 10*3/uL (ref 0–0.1)
Basophils Relative: 1 %
EOS ABS: 0 10*3/uL (ref 0–0.7)
Eosinophils Relative: 0 %
HEMATOCRIT: 35.3 % (ref 35.0–47.0)
HEMOGLOBIN: 11.9 g/dL — AB (ref 12.0–16.0)
LYMPHS ABS: 1.4 10*3/uL (ref 1.0–3.6)
Lymphocytes Relative: 16 %
MCH: 29.9 pg (ref 26.0–34.0)
MCHC: 33.6 g/dL (ref 32.0–36.0)
MCV: 88.9 fL (ref 80.0–100.0)
MONOS PCT: 10 %
Monocytes Absolute: 0.8 10*3/uL (ref 0.2–0.9)
NEUTROS PCT: 73 %
Neutro Abs: 6.2 10*3/uL (ref 1.4–6.5)
Platelets: 303 10*3/uL (ref 150–440)
RBC: 3.97 MIL/uL (ref 3.80–5.20)
RDW: 14.6 % — ABNORMAL HIGH (ref 11.5–14.5)
WBC: 8.6 10*3/uL (ref 3.6–11.0)

## 2017-02-04 LAB — COMPREHENSIVE METABOLIC PANEL
ALK PHOS: 85 U/L (ref 38–126)
ALT: 15 U/L (ref 14–54)
ANION GAP: 7 (ref 5–15)
AST: 27 U/L (ref 15–41)
Albumin: 2.8 g/dL — ABNORMAL LOW (ref 3.5–5.0)
BILIRUBIN TOTAL: 0.4 mg/dL (ref 0.3–1.2)
BUN: 21 mg/dL — ABNORMAL HIGH (ref 6–20)
CALCIUM: 8.7 mg/dL — AB (ref 8.9–10.3)
CO2: 26 mmol/L (ref 22–32)
Chloride: 104 mmol/L (ref 101–111)
Creatinine, Ser: 0.92 mg/dL (ref 0.44–1.00)
GFR calc non Af Amer: 53 mL/min — ABNORMAL LOW (ref >60)
Glucose, Bld: 139 mg/dL — ABNORMAL HIGH (ref 65–99)
Potassium: 3.8 mmol/L (ref 3.5–5.1)
SODIUM: 137 mmol/L (ref 135–145)
TOTAL PROTEIN: 6.8 g/dL (ref 6.5–8.1)

## 2017-02-04 LAB — TROPONIN I: Troponin I: 0.16 ng/mL (ref <0.03)

## 2017-02-04 MED ORDER — ONDANSETRON HCL 4 MG/2ML IJ SOLN: 4.0000 mg | Freq: Four times a day (QID) | INTRAMUSCULAR | Status: DC | PRN

## 2017-02-04 MED ORDER — MORPHINE SULFATE (PF) 2 MG/ML IV SOLN
2.0000 mg | Freq: Once | INTRAVENOUS | Status: AC
  Administered 2017-02-04: 2 mg via INTRAVENOUS

## 2017-02-04 MED ORDER — MONTELUKAST SODIUM 10 MG PO TABS
10.0000 mg | ORAL_TABLET | Freq: Every day | ORAL | Status: DC
  Administered 2017-02-05 – 2017-02-07 (×3): 10 mg via ORAL
  Filled 2017-02-04 (×3): qty 1

## 2017-02-04 MED ORDER — MORPHINE SULFATE (PF) 2 MG/ML IV SOLN
INTRAVENOUS | Status: AC
  Filled 2017-02-04: qty 1

## 2017-02-04 MED ORDER — DONEPEZIL HCL 5 MG PO TABS
5.0000 mg | ORAL_TABLET | Freq: Every day | ORAL | Status: DC
  Administered 2017-02-05 – 2017-02-06 (×3): 5 mg via ORAL
  Filled 2017-02-04 (×3): qty 1

## 2017-02-04 MED ORDER — ACETAMINOPHEN 650 MG RE SUPP: 650.0000 mg | Freq: Four times a day (QID) | RECTAL | Status: DC | PRN

## 2017-02-04 MED ORDER — ONDANSETRON HCL 4 MG PO TABS: 4.0000 mg | ORAL_TABLET | Freq: Four times a day (QID) | ORAL | Status: DC | PRN

## 2017-02-04 MED ORDER — ACETAMINOPHEN 325 MG PO TABS: 650.0000 mg | ORAL_TABLET | Freq: Four times a day (QID) | ORAL | Status: DC | PRN

## 2017-02-04 MED ORDER — MIRTAZAPINE 15 MG PO TABS
7.5000 mg | ORAL_TABLET | Freq: Every day | ORAL | Status: DC
  Administered 2017-02-05 – 2017-02-06 (×3): 7.5 mg via ORAL
  Filled 2017-02-04 (×3): qty 1

## 2017-02-04 MED ORDER — MEMANTINE HCL 5 MG PO TABS
10.0000 mg | ORAL_TABLET | Freq: Two times a day (BID) | ORAL | Status: DC
  Administered 2017-02-05 – 2017-02-07 (×6): 10 mg via ORAL
  Filled 2017-02-04 (×6): qty 2

## 2017-02-04 MED ORDER — ASPIRIN EC 81 MG PO TBEC
81.0000 mg | DELAYED_RELEASE_TABLET | Freq: Every day | ORAL | Status: DC
  Administered 2017-02-05 – 2017-02-07 (×3): 81 mg via ORAL
  Filled 2017-02-04 (×3): qty 1

## 2017-02-04 MED ORDER — MOMETASONE FURO-FORMOTEROL FUM 200-5 MCG/ACT IN AERO
2.0000 | INHALATION_SPRAY | Freq: Two times a day (BID) | RESPIRATORY_TRACT | Status: DC
  Administered 2017-02-05 – 2017-02-07 (×6): 2 via RESPIRATORY_TRACT
  Filled 2017-02-04: qty 8.8

## 2017-02-04 MED ORDER — OXYCODONE HCL 5 MG PO TABS
5.0000 mg | ORAL_TABLET | ORAL | Status: DC | PRN
  Administered 2017-02-05 – 2017-02-07 (×9): 5 mg via ORAL
  Filled 2017-02-04 (×9): qty 1

## 2017-02-04 MED ORDER — ENOXAPARIN SODIUM 40 MG/0.4ML ~~LOC~~ SOLN
40.0000 mg | SUBCUTANEOUS | Status: DC
  Administered 2017-02-05 – 2017-02-06 (×2): 40 mg via SUBCUTANEOUS
  Filled 2017-02-04 (×2): qty 0.4

## 2017-02-04 MED ORDER — MORPHINE SULFATE (PF) 2 MG/ML IV SOLN: 2.0000 mg | INTRAVENOUS | Status: DC | PRN

## 2017-02-04 NOTE — ED Notes (Signed)
Pt complains of pain on ambulation and movement of right leg to right inner groin. Cms intact to all toes. Pt's son states pt has a CVA that affected her left leg "a couple of weeks ago". Pt is able to bear weight and move left leg without difficulty.

## 2017-02-04 NOTE — ED Notes (Signed)
Pt with elevation of hr to 110, pt complains of increased pain to right hip. Order for morphine received by dr. Anne Hahn.

## 2017-02-04 NOTE — ED Notes (Signed)
Pt's son provided with multiple updates, however does not appear to understand admission process. Will continue to reinforce.

## 2017-02-04 NOTE — H&P (Signed)
[facility] - [place] at [facility]   PATIENT NAME: Theresa Frank    MR#:  [id number]  DATE OF BIRTH:  01-15-1925  DATE OF ADMISSION:  02/04/2017  PRIMARY CARE PHYSICIAN: Kandyce Rud, MD   REQUESTING/REFERRING PHYSICIAN: Mayford Knife, MD  CHIEF COMPLAINT:   Chief Complaint  Patient presents with  . possible pelvis fracture    HISTORY OF PRESENT ILLNESS:  Theresa Frank  is a 83 y.o. female who presents with Fall and subsequent pelvic fracture. Patient was standing up trying to reach her walker and fell. Imaging here shows left parasymphyseal fractures.  She also mildly elevated troponin 0.16. Patient had a difficult time walking even with assistance. Hospitalists were called for admission.  PAST MEDICAL HISTORY:   Past Medical History:  Diagnosis Date  . Alzheimer's dementia, late onset, with behavioral disturbance   . Asthma   . Colon polyps   . Diverticulosis   . Hypertension   . MI (myocardial infarction) (HCC)     PAST SURGICAL HISTORY:   Past Surgical History:  Procedure Laterality Date  . ABDOMINAL HYSTERECTOMY    . HERNIA REPAIR      SOCIAL HISTORY:   Social History  Substance Use Topics  . Smoking status: Never Smoker  . Smokeless tobacco: Never Used  . Alcohol use No    FAMILY HISTORY:   Family History  Problem Relation Age of Onset  . Stomach cancer Father     DRUG ALLERGIES:   Allergies  Allergen Reactions  . Ciprofloxacin   . Penicillins   . Premarin [Conjugated Estrogens]   . Septra [Sulfamethoxazole-Trimethoprim]   . Zocor [Simvastatin]     MEDICATIONS AT HOME:   Prior to Admission medications   Medication Sig Start Date End Date Taking? Authorizing Provider  aspirin EC 81 MG EC tablet Take 1 tablet (81 mg total) by mouth daily. 01/13/17  Yes Sudini, Wardell Heath, MD  donepezil (ARICEPT) 5 MG tablet Take 5 mg by mouth at bedtime.   Yes [provider]  Fluticasone-Salmeterol (ADVAIR DISKUS) 250-50 MCG/DOSE  AEPB Inhale 1 puff into the lungs 2 (two) times daily.   Yes [provider]  memantine (NAMENDA) 10 MG tablet Take 10 mg by mouth 2 (two) times daily.   Yes [provider]  mirtazapine (REMERON) 15 MG tablet Take 7.5 mg by mouth at bedtime.   Yes [provider]  montelukast (SINGULAIR) 10 MG tablet Take 10 mg by mouth daily.   Yes [provider]    REVIEW OF SYSTEMS:  Review of Systems  Constitutional: Negative for chills, fever, malaise/fatigue and weight loss.  HENT: Negative for ear pain, hearing loss and tinnitus.   Eyes: Negative for blurred vision, double vision, pain and redness.  Respiratory: Negative for cough, hemoptysis and shortness of breath.   Cardiovascular: Negative for chest pain, palpitations, orthopnea and leg swelling.  Gastrointestinal: Negative for abdominal pain, constipation, diarrhea, nausea and vomiting.  Genitourinary: Negative for dysuria, frequency and hematuria.  Musculoskeletal: Positive for joint pain (Pelvis). Negative for back pain and neck pain.  Skin:       No acne, rash, or lesions  Neurological: Negative for dizziness, tremors, focal weakness and weakness.  Endo/Heme/Allergies: Negative for polydipsia. Does not bruise/bleed easily.  Psychiatric/Behavioral: Negative for depression. The patient is not nervous/anxious and does not have insomnia.      VITAL SIGNS:   Vitals:   02/04/17 1845 02/04/17 2100 02/04/17 2230  BP: 99/68  132/88  Pulse: 100  Marland Kitchen)  111  Resp: 16 16 (!) 26  Temp: 98.7 F (37.1 C)    TempSrc: Oral    SpO2: 97%  96%  Weight: 49 kg (108 lb)    Height: 5\' 8"  (1.727 m)     Wt Readings from Last 3 Encounters:  02/04/17 49 kg (108 lb)  01/12/17 48.1 kg (106 lb)    PHYSICAL EXAMINATION:  Physical Exam  Vitals reviewed. Constitutional: She is oriented to person, place, and time. She appears well-developed and well-nourished. No distress.  HENT:  Head: Normocephalic and atraumatic.   Mouth/Throat: Oropharynx is clear and moist.  Eyes: Conjunctivae and EOM are normal. Pupils are equal, round, and reactive to light. No scleral icterus.  Neck: Normal range of motion. Neck supple. No JVD present. No thyromegaly present.  Cardiovascular: Normal rate, regular rhythm and intact distal pulses.  Exam reveals no gallop and no friction rub.   No murmur heard. Respiratory: Effort normal and breath sounds normal. No respiratory distress. She has no wheezes. She has no rales.  GI: Soft. Bowel sounds are normal. She exhibits no distension. There is no tenderness.  Musculoskeletal: Normal range of motion. She exhibits tenderness (Pelvis). She exhibits no edema.  No arthritis, no gout  Lymphadenopathy:    She has no cervical adenopathy.  Neurological: She is alert and oriented to person, place, and time. No cranial nerve deficit.  No dysarthria, no aphasia  Skin: Skin is warm and dry. No rash noted. No erythema.  Psychiatric: She has a normal mood and affect. Her behavior is normal. Judgment and thought content normal.    LABORATORY PANEL:   CBC  Recent Labs Lab 02/04/17 2032  WBC 8.6  HGB 11.9*  HCT 35.3  PLT 303   ------------------------------------------------------------------------------------------------------------------  Chemistries   Recent Labs Lab 02/04/17 2032  NA 137  K 3.8  CL 104  CO2 26  GLUCOSE 139*  BUN 21*  CREATININE 0.92  CALCIUM 8.7*  AST 27  ALT 15  ALKPHOS 85  BILITOT 0.4   ------------------------------------------------------------------------------------------------------------------  Cardiac Enzymes  Recent Labs Lab 02/04/17 2032  TROPONINI 0.16*   ------------------------------------------------------------------------------------------------------------------  RADIOLOGY:  Dg Pelvis 1-2 Views  Result Date: 02/04/2017 CLINICAL DATA:  Left buttock and hip pain since a fall 02/02/2017. Initial encounter. EXAM: PELVIS - 1-2  VIEW COMPARISON:  None. FINDINGS: The patient has acute left parasymphyseal pubic bone fractures. No other fracture is identified. Convex left lumbar scoliosis and degenerative disease are noted. Atherosclerosis is identified. Mesh from prior hernia repair is noted. IMPRESSION: Acute left parasymphyseal pubic bone fractures. Convex left lumbar scoliosis and spondylosis. Atherosclerosis. Electronically Signed   By: Drusilla Kanner M.D.   On: 02/04/2017 19:11    EKG:   Orders placed or performed during the hospital encounter of 02/04/17  . ED EKG  . ED EKG    IMPRESSION AND PLAN:  Principal Problem:   Pelvic fracture (HCC) - orthopedics consult, PT and OT consults, pain control Active Problems:   Elevated troponin - unclear etiology, do not strongly suspect ACS. We will trend her cardiac enzymes tonight.   HTN (hypertension) - continue home meds   Asthma - continue home meds   Alzheimer disease - continue home medications  All the records are reviewed and case discussed with ED provider. Management plans discussed with the patient and/or family.  DVT PROPHYLAXIS: SubQ lovenox  GI PROPHYLAXIS: None  ADMISSION STATUS: Inpatient  CODE STATUS: Full Code Status History    Date Active Date Inactive Code Status  Order ID Comments User Context   01/11/2017  5:02 AM 01/12/2017  5:51 PM Full Code [id number]  Arnaldo Natal, MD Inpatient      TOTAL TIME TAKING CARE OF THIS PATIENT: 40 minutes.   Theresa Frank 02/04/2017, 10:42 PM  [facility]  Office  [phone]  CC: Primary care physician; Kandyce Rud, MD  Note:  This document was prepared using Dragon voice recognition software and may include unintentional dictation errors.

## 2017-02-04 NOTE — ED Notes (Signed)
Critical troponin of 0.16 called from lab. Dr. Mayford Knife notified, no new orders received.

## 2017-02-04 NOTE — ED Provider Notes (Signed)
[facility] Emergency Department Provider Note       Time seen: ----------------------------------------- 8:34 PM on 02/04/2017 -----------------------------------------  Level V caveat: History/ROS limited by dementia   I have reviewed the triage vital signs and the nursing notes.   HISTORY   Chief Complaint possible pelvis fracture    HPI Theresa Frank is a 83 y.o. female who presents to the ED for a mechanical fall that occurred on Wednesday. Patient was walking with her walker when she slipped and fell. She went to her primary care doctor that day and they called her today to tell her that she had a pelvic fracture. Family states she cannot bear weight due to the pain. She is having pain and around her pelvis whenever she walks and pain with movement of the left leg. No other information is available.   Past Medical History:  Diagnosis Date  . Alzheimer's dementia, late onset, with behavioral disturbance   . Asthma   . Colon polyps   . Diverticulosis   . Hypertension   . MI (myocardial infarction) [facility])     Patient Active Problem List   Diagnosis Date Noted  . Fall 01/11/2017  . TIA (transient ischemic attack) 01/10/2017  . HTN (hypertension) 01/10/2017  . Asthma 01/10/2017  . Alzheimer disease 01/10/2017    Past Surgical History:  Procedure Laterality Date  . ABDOMINAL HYSTERECTOMY    . HERNIA REPAIR      Allergies Ciprofloxacin; Penicillins; Premarin [conjugated estrogens]; Septra [sulfamethoxazole-trimethoprim]; and Zocor [simvastatin]  Social History Social History  Substance Use Topics  . Smoking status: Never Smoker  . Smokeless tobacco: Never Used  . Alcohol use No    Review of Systems Positive for leg pain and difficulty with ambulation  All systems negative/normal/unremarkable except as stated in the HPI  ____________________________________________   PHYSICAL EXAM:  VITAL SIGNS: ED Triage Vitals [02/04/17  1845]  Enc Vitals Group     BP 99/68     Pulse Rate 100     Resp 16     Temp 98.7 F (37.1 C)     Temp Source Oral     SpO2 97 %     Weight 108 lb (49 kg)     Height 5\' 8"  (1.727 m)     Head Circumference      Peak Flow      Pain Score 5     Pain Loc      Pain Edu?      Excl. in GC?     Constitutional: Alert But disoriented, Well appearing and in no distress. Eyes: Conjunctivae are normal. Normal extraocular movements. ENT   Head: Normocephalic and atraumatic.   Nose: No congestion/rhinnorhea.   Mouth/Throat: Mucous membranes are moist.   Neck: No stridor. Cardiovascular: Normal rate, regular rhythm. No murmurs, rubs, or gallops. Respiratory: Normal respiratory effort without tachypnea nor retractions. Breath sounds are clear and equal bilaterally. No wheezes/rales/rhonchi. Gastrointestinal: Soft and nontender. Normal bowel sounds Musculoskeletal: Nontender with normal range of motion in extremities. No lower extremity tenderness nor edema. Unable to and leg due to pain Neurologic:  Normal speech and language. No gross focal neurologic deficits are appreciated.  Skin:  Skin is warm, dry and intact. No rash noted. Psychiatric: Mood and affect are normal. Speech and behavior are normal.  ____________________________________________  EKG: Interpreted by me.Sinus rhythm rate 88 bpm, normal PR interval, wide QRS, normal QT, right bundle branch block, left posterior fascicular block  ____________________________________________  ED COURSE:  Pertinent labs & imaging results that were available during my care of the patient were reviewed by me and considered in my medical decision making (see chart for details). Patient presents for possible pelvic fracture from mechanical fall, we will assess with labs and imaging as indicated.   Procedures ____________________________________________   LABS (pertinent positives/negatives)  Labs Reviewed  CBC WITH  DIFFERENTIAL/PLATELET - Abnormal; Notable for the following:       Result Value   Hemoglobin 11.9 (*)    RDW 14.6 (*)    All other components within normal limits  COMPREHENSIVE METABOLIC PANEL  URINALYSIS, COMPLETE (UACMP) WITH MICROSCOPIC  TROPONIN I    RADIOLOGY Images were viewed by me Pelvic x-ray IMPRESSION: Acute left parasymphyseal pubic bone fractures.  Convex left lumbar scoliosis and spondylosis.  Atherosclerosis.   ____________________________________________  FINAL ASSESSMENT AND PLAN  Mechanical fall, pelvic fracture  Plan: Patient's labs and imaging were dictated above. Patient had presented for a mechanical fall and was found to have a pelvic fracture. She was unable to ambulate for [place] here due to pain. She will need to be admitted, receive pain control as well as PT and social work consults.   Emily Filbert, MD   Note: This note was generated in part or whole with voice recognition software. Voice recognition is usually quite accurate but there are transcription errors that can and very often do occur. I apologize for any typographical errors that were not detected and corrected.     Emily Filbert, MD 02/04/17 2124

## 2017-02-04 NOTE — ED Triage Notes (Signed)
Pt fell Wednesday walking with walker.  Was mechanical fall.  Pt went to PCP that day and they called her today and said may have fracture of pelvis. Pt has pain to left hip/buttock area.  Pt has pain with movement of left leg.

## 2017-02-04 NOTE — ED Notes (Signed)
Dr. Willis in to see pt.

## 2017-02-04 NOTE — ED Notes (Signed)
Pt's son states no changes have been made to pt's home medications since last admission.

## 2017-02-04 NOTE — ED Notes (Signed)
Pt assisted with bedpan for urination. Pt was not able to urinate.

## 2017-02-05 LAB — CBC
HEMATOCRIT: 30.7 % — AB (ref 35.0–47.0)
Hemoglobin: 10.5 g/dL — ABNORMAL LOW (ref 12.0–16.0)
MCH: 30 pg (ref 26.0–34.0)
MCHC: 34.3 g/dL (ref 32.0–36.0)
MCV: 87.3 fL (ref 80.0–100.0)
Platelets: 244 10*3/uL (ref 150–440)
RBC: 3.51 MIL/uL — ABNORMAL LOW (ref 3.80–5.20)
RDW: 14.4 % (ref 11.5–14.5)
WBC: 6.3 10*3/uL (ref 3.6–11.0)

## 2017-02-05 LAB — BASIC METABOLIC PANEL
Anion gap: 5 (ref 5–15)
BUN: 18 mg/dL (ref 6–20)
CHLORIDE: 105 mmol/L (ref 101–111)
CO2: 27 mmol/L (ref 22–32)
Calcium: 8.1 mg/dL — ABNORMAL LOW (ref 8.9–10.3)
Creatinine, Ser: 0.94 mg/dL (ref 0.44–1.00)
GFR calc Af Amer: >60 mL/min (ref >60)
GFR calc non Af Amer: 52 mL/min — ABNORMAL LOW (ref >60)
Glucose, Bld: 91 mg/dL (ref 65–99)
POTASSIUM: 3.5 mmol/L (ref 3.5–5.1)
Sodium: 137 mmol/L (ref 135–145)

## 2017-02-05 LAB — URINALYSIS, COMPLETE (UACMP) WITH MICROSCOPIC
BILIRUBIN URINE: NEGATIVE
Bacteria, UA: NONE SEEN
Glucose, UA: NEGATIVE mg/dL
KETONES UR: NEGATIVE mg/dL
Nitrite: NEGATIVE
PROTEIN: NEGATIVE mg/dL
Specific Gravity, Urine: 1.006 (ref 1.005–1.030)
pH: 5 (ref 5.0–8.0)

## 2017-02-05 LAB — TROPONIN I
TROPONIN I: 0.14 ng/mL — AB (ref <0.03)
Troponin I: 0.11 ng/mL (ref <0.03)
Troponin I: 0.12 ng/mL (ref <0.03)

## 2017-02-05 LAB — CK: CK TOTAL: 40 U/L (ref 38–234)

## 2017-02-05 NOTE — Progress Notes (Signed)
[facility] -  at [facility]   PATIENT NAME: Theresa Frank    MR#:  [id number]  DATE OF BIRTH:  [birth year]  SUBJECTIVE:  CHIEF COMPLAINT:   Chief Complaint  Patient presents with  . possible pelvis fracture   Pleasantly confused. Pain on movement.  REVIEW OF SYSTEMS:    Review of Systems  Unable to perform ROS: Dementia    DRUG ALLERGIES:   Allergies  Allergen Reactions  . Ciprofloxacin Other (See Comments)    Reaction: unknown  . Penicillins Other (See Comments)    Reaction: unknown Has patient had a PCN reaction causing immediate rash, facial/tongue/throat swelling, SOB or lightheadedness with hypotension: Unknown Has patient had a PCN reaction causing severe rash involving mucus membranes or skin necrosis: Unknown Has patient had a PCN reaction that required hospitalization: Unknown Has patient had a PCN reaction occurring within the last 10 years: Unknown If all of the above answers are "NO", then may proceed with Cephalosporin use.   . Premarin [Conjugated Estrogens] Other (See Comments)    Reaction: unknown  . Septra [Sulfamethoxazole-Trimethoprim] Other (See Comments)    Reaction: unknown  . Zocor [Simvastatin] Other (See Comments)    Reaction: unknown    VITALS:  Blood pressure 116/68, pulse 70, temperature 98.6 F (37 C), temperature source Oral, resp. rate 16, height 5\' 8"  (1.727 m), weight 50.7 kg (111 lb 11.2 oz), SpO2 97 %.  PHYSICAL EXAMINATION:   Physical Exam  GENERAL:  83 y.o.-year-old patient lying in the bed with no acute distress.  EYES: Pupils equal, round, reactive to light and accommodation. No scleral icterus. Extraocular muscles intact.  HEENT: Head atraumatic, normocephalic. Oropharynx and nasopharynx clear.  NECK:  Supple, no jugular venous distention. No thyroid enlargement, no tenderness.  LUNGS: Normal breath sounds bilaterally, no wheezing, rales, rhonchi. No use of accessory muscles of respiration.   CARDIOVASCULAR: S1, S2 normal. No murmurs, rubs, or gallops.  ABDOMEN: Soft, nontender, nondistended. Bowel sounds present. No organomegaly or mass.  EXTREMITIES: No cyanosis, clubbing or edema b/l.    NEUROLOGIC: Cranial nerves II through XII are intact. No focal Motor or sensory deficits b/l.   PSYCHIATRIC: The patient is alert and awake SKIN: No obvious rash, lesion, or ulcer.  Tenderness left groin area  LABORATORY PANEL:   CBC  Recent Labs Lab 02/05/17 0545  WBC 6.3  HGB 10.5*  HCT 30.7*  PLT 244   ------------------------------------------------------------------------------------------------------------------ Chemistries   Recent Labs Lab 02/04/17 2032 02/05/17 0545  NA 137 137  K 3.8 3.5  CL 104 105  CO2 26 27  GLUCOSE 139* 91  BUN 21* 18  CREATININE 0.92 0.94  CALCIUM 8.7* 8.1*  AST 27  --   ALT 15  --   ALKPHOS 85  --   BILITOT 0.4  --    ------------------------------------------------------------------------------------------------------------------  Cardiac Enzymes  Recent Labs Lab 02/05/17 1129  TROPONINI 0.14*   ------------------------------------------------------------------------------------------------------------------  RADIOLOGY:  Dg Pelvis 1-2 Views  Result Date: 02/04/2017 CLINICAL DATA:  Left buttock and hip pain since a fall 02/02/2017. Initial encounter. EXAM: PELVIS - 1-2 VIEW COMPARISON:  None. FINDINGS: The patient has acute left parasymphyseal pubic bone fractures. No other fracture is identified. Convex left lumbar scoliosis and degenerative disease are noted. Atherosclerosis is identified. Mesh from prior hernia repair is noted. IMPRESSION: Acute left parasymphyseal pubic bone fractures. Convex left lumbar scoliosis and spondylosis. Atherosclerosis. Electronically Signed   By: Drusilla Kanner M.D.   On: 02/04/2017 19:11  ASSESSMENT AND PLAN:    * Pelvic fracture - orthopedics consulted And appreciate input No surgery  needed PT and OT consults pain control Will likely need rehabilitation after discharge   * Elevated troponin  Trending down. Not MI. No chest pain or shortness of breath. EKG shows no acute changes.    *HTN (hypertension) - continue home meds    *Asthma - continue home meds   * Alzheimer disease - continue home medications  All the records are reviewed and case discussed with Care Management/Social Workerr. Management plans discussed with the patient, family and they are in agreement.  CODE STATUS: FULL CODE  DVT Prophylaxis: SCDs  TOTAL TIME TAKING CARE OF THIS PATIENT: 30 minutes.   POSSIBLE D/C IN 1-2 DAYS, DEPENDING ON CLINICAL CONDITION.  Milagros Loll R M.D on 02/05/2017 at 12:37 PM  Between 7am to 6pm - Pager - [phone]  After 6pm go to [email or web address] - password [email or web address] [facility]  [facility]  Office  [phone]  CC: Primary care physician; Kandyce Rud, MD  Note: This dictation was prepared with Dragon dictation along with smaller phrase technology. Any transcriptional errors that result from this process are unintentional.

## 2017-02-05 NOTE — Clinical Social Work Note (Signed)
CSW received consult that patient may require SNF placement. CSW will follow pending PT recommendations.  Argentina Ponder, MSW, [name] [phone]

## 2017-02-05 NOTE — Consult Note (Signed)
Patient is a 83 year old who suffered a fall 3 days prior to admission with subsequent difficulty with getting in and out of bed at home. She was brought to the emergency room and found to have a left pubic ramus fracture minimally displaced. On exam she is tender to palpation at that area. Distally she is neurovascularly intact, strong pulses. No pain with logrolling. X-rays reveal minimally displaced left pubic rami fracture near the symphysis. Impression is left grams fracture Plan physical therapy is weightbearing as tolerated. Discussed that this should be better in 4-6 weeks but there is significant pain with motion and weightbearing and especially in the first 2 weeks

## 2017-02-05 NOTE — Evaluation (Addendum)
Physical Therapy Evaluation Patient Details Name: Theresa Frank MRN: [id number] DOB: 05/06/1925 Today's Date: 02/05/2017   History of Present Illness  Patient is a 83 year old who suffered a fall 3 days prior to admission with subsequent difficulty with getting in and out of bed at home. She was brought to the emergency room and found to have a left pubic ramus fracture minimally displaced.   Clinical Impression  Pt presents to PT with generalized weakness in UE/LE's, especially L LE, Mod A for bed mobility, Min A +76for transfers, decreased safety awareness, and limited ambulation to 3' with RW and Min A.  Pt's husband is not able to physically assist PT with mobility at home, and is receiving home services himself through the [place].  Pt also just beginning home services as well.  Pt would benefit from acute PT services to address objective findings.    Follow Up Recommendations SNF    Equipment Recommendations  Other (comment) (defer to post acute)    Recommendations for Other Services       Precautions / Restrictions Precautions Precautions: Fall Restrictions Weight Bearing Restrictions: Yes Other Position/Activity Restrictions: L WBAT      Mobility  Bed Mobility Overal bed mobility: Needs Assistance Bed Mobility: Supine to Sit;Sit to Supine     Supine to sit: Min assist Sit to supine: Mod assist   General bed mobility comments: Assist needed for moving L LE on/off bed and to elevate trunk/rotate hips to EOB  Transfers Overall transfer level: Needs assistance Equipment used: Rolling walker (2 wheeled) Transfers: Sit to/from Stand Sit to Stand: Min assist;+2 physical assistance         General transfer comment: verbal cues for hand placement on bed to assist with lift off, poor body mechanics and leverage  Ambulation/Gait Ambulation/Gait assistance: Min assist Ambulation Distance (Feet): 3 Feet Assistive device: Rolling walker (2 wheeled) Gait Pattern/deviations:  Step-to pattern;Shuffle     General Gait Details: unable to lift L foot off floor, slides it forward letting go of the walker with her R UE to assist moving leg; shuffles forward with R LE; unable to tolerate due to pain  Stairs            Wheelchair Mobility    Modified Rankin (Stroke Patients Only)       Balance Overall balance assessment: Needs assistance;History of Falls Sitting-balance support: Feet supported Sitting balance-Leahy Scale: Good     Standing balance support: Bilateral upper extremity supported;During functional activity Standing balance-Leahy Scale: Fair Standing balance comment: needs MinA during ambulation                             Pertinent Vitals/Pain      Home Living Family/patient expects to be discharged to:: Private residence Living Arrangements: Spouse/significant other;Children (per chart review son moved in recently) Available Help at Discharge: Family;Available PRN/intermittently;Personal care attendant (5x/week through [place], recently started) Type of Home: House Home Access: Ramped entrance     Home Layout: One level Home Equipment: Walker - 2 wheels;Cane - single point;Grab bars - tub/shower;Shower seat      Prior Function Level of Independence: Needs assistance   Gait / Transfers Assistance Needed: no help getting out of bed or standing; uses   ADL's / Homemaking Assistance Needed: assist needed  Comments: Had just started having aides 5d/wk to assist with bathing, cooking, and cleaning     Hand Dominance  Extremity/Trunk Assessment   Upper Extremity Assessment Upper Extremity Assessment: Generalized weakness (3/5)    Lower Extremity Assessment Lower Extremity Assessment: Generalized weakness (R LE 3/5)       Communication   Communication: HOH  Cognition Arousal/Alertness: Awake/alert Behavior During Therapy: WFL for tasks assessed/performed Overall Cognitive Status: History of cognitive  impairments - at baseline                                        General Comments General comments (skin integrity, edema, etc.): visible areas c/d/i; at risk for skin breakdown due to decreased mobility    Exercises     Assessment/Plan    PT Assessment Patient needs continued PT services  PT Problem List Decreased strength;Decreased activity tolerance;Decreased balance;Decreased mobility;Decreased knowledge of use of DME;Decreased safety awareness;Pain       PT Treatment Interventions      PT Goals (Current goals can be found in the Care Plan section)  Acute Rehab PT Goals Patient Stated Goal: To go home. PT Goal Formulation: With patient/family Time For Goal Achievement: 02/12/17 Potential to Achieve Goals: Good    Frequency 7x/week;   Treatment/Interventions: DME instruction;Gait training;Functional mobility training;Therapeutic activities;Therapeutic exercise;Balance training;Patient/family education   Barriers to discharge Decreased caregiver support does not have 24/7 supervision    Co-evaluation PT/OT/SLP Co-Evaluation/Treatment: Yes Reason for Co-Treatment: For patient/therapist safety PT goals addressed during session: Proper use of DME;Mobility/safety with mobility         AM-PAC PT "6 Clicks" Daily Activity  Outcome Measure Difficulty turning over in bed (including adjusting bedclothes, sheets and blankets)?: Total Difficulty moving from lying on back to sitting on the side of the bed? : Total Difficulty sitting down on and standing up from a chair with arms (e.g., wheelchair, bedside commode, etc,.)?: Total Help needed moving to and from a bed to chair (including a wheelchair)?: A Little Help needed walking in hospital room?: A Lot Help needed climbing 3-5 steps with a railing? : Total 6 Click Score: 9    End of Session Equipment Utilized During Treatment: Gait belt Activity Tolerance: Patient limited by pain Patient left: in  bed;with call bell/phone within reach;with bed alarm set;with family/visitor present Nurse Communication: Mobility status PT Visit Diagnosis: Muscle weakness (generalized) (M62.81);History of falling (Z91.81);Difficulty in walking, not elsewhere classified (R26.2);Pain Pain - Right/Left: Left Pain - part of body: Leg    Time: 3794-3276 PT Time Calculation (min) (ACUTE ONLY): 35 min   Charges:   PT Evaluation $PT Eval Low Complexity: 1 Procedure PT Treatments $Gait Training: 8-22 mins   PT G Codes:   PT G-Codes **NOT FOR INPATIENT CLASS** Functional Assessment Tool Used: AM-PAC 6 Clicks Basic Mobility Functional Limitation: Mobility: Walking and moving around Mobility: Walking and Moving Around Current Status (D4709): At least 60 percent but less than 80 percent impaired, limited or restricted Mobility: Walking and Moving Around Goal Status [phone]): At least 40 percent but less than 60 percent impaired, limited or restricted    [employer], PT 02/05/2017, 2:01 PM

## 2017-02-05 NOTE — Evaluation (Signed)
Occupational Therapy Evaluation Patient Details Name: Theresa Frank MRN: [id number] DOB: 1924/09/23 Today's Date: 02/05/2017    History of Present Illness Patient is a 83 year old who suffered a fall 3 days prior to admission with subsequent difficulty with getting in and out of bed at home. She was brought to the emergency room and found to have a left pubic ramus fracture minimally displaced.    Clinical Impression   Pt seen for OT evaluation. Pt lives at home with her husband and adult son (per chart review from previous admission for fall/TIA 01/11/17). Pt has recently required additional assistance for self care tasks, meals/house mgt tasks, and supervision due to baseline dementia, multiple falls, functional decline. Pt is currently limited in functional ADLs due to pain, decreased activity tolerance/safety awareness.  Pt requires minimum assist for LB ADL due to pain fatigue. Of note, spouse in room (very [facility]) looked very unsafe/unsteady ambulating from recliner to bathroom touching furniture (including rolling tray table) for stability. Pt would benefit from continued skilled OT services for education/training in AE for self care tasks, functional mobility training with RW, and caregiver/family education in recommendations for home/routines modifications to increase safety and prevent future falls/injury/rehospitalization.  Pt is a good candidate for SNF to continue rehabilitation.    Follow Up Recommendations  SNF    Equipment Recommendations  Other (comment) (defer to next venue of care)    Recommendations for Other Services       Precautions / Restrictions Precautions Precautions: Fall Restrictions Weight Bearing Restrictions: Yes Other Position/Activity Restrictions: L WBAT      Mobility Bed Mobility Overal bed mobility: Needs Assistance Bed Mobility: Supine to Sit;Sit to Supine     Supine to sit: Min assist Sit to supine: Mod assist   General bed mobility comments:  Assist needed for moving L LE on/off bed and to elevate trunk/rotate hips to EOB  Transfers Overall transfer level: Needs assistance Equipment used: Rolling walker (2 wheeled) Transfers: Sit to/from Stand Sit to Stand: Min assist;+2 physical assistance         General transfer comment: verbal cues for hand placement on bed to assist with lift off, poor body mechanics and leverage    Balance Overall balance assessment: Needs assistance;History of Falls Sitting-balance support: Feet supported Sitting balance-Leahy Scale: Good     Standing balance support: Bilateral upper extremity supported;During functional activity Standing balance-Leahy Scale: Fair Standing balance comment: needs MinA during ambulation                           ADL either performed or assessed with clinical judgement   ADL Overall ADL's : Needs assistance/impaired Eating/Feeding: Set up;Bed level   Grooming: Set up;Bed level   Upper Body Bathing: Bed level;Minimal assistance   Lower Body Bathing: Moderate assistance;Bed level   Upper Body Dressing : Set up;Supervision/safety;Sitting   Lower Body Dressing: Sitting/lateral leans;Sit to/from stand;Min guard Lower Body Dressing Details (indicate cue type and reason): pt able to don/doff socks seated EOB with min guard for safety with increased effort/pain/difficulty with LLE versus RLE    Toilet Transfer Details (indicate cue type and reason): deferred due to pt safety/fatigue/pain         Functional mobility during ADLs: Rolling walker;Min guard (min guard with VC for RW safety, able to walk a couple feet ) General ADL Comments: pt generally min-mod assist for bathing, cues for safety, has functionally declined since recent admission on 01/11/17  Vision Baseline Vision/History: No visual deficits Patient Visual Report: No change from baseline Vision Assessment?: No apparent visual deficits     Perception     Praxis      Pertinent  Vitals/Pain Pain Assessment: 0-10 Pain Score: 6  Pain Location: L hip Pain Descriptors / Indicators: Aching Pain Intervention(s): Limited activity within patient's tolerance;Monitored during session;Repositioned     Hand Dominance     Extremity/Trunk Assessment Upper Extremity Assessment Upper Extremity Assessment: Generalized weakness (grossly 3/5 bilaterally)   Lower Extremity Assessment Lower Extremity Assessment: Defer to PT evaluation;Generalized weakness       Communication Communication Communication: HOH   Cognition Arousal/Alertness: Awake/alert Behavior During Therapy: WFL for tasks assessed/performed Overall Cognitive Status: History of cognitive impairments - at baseline                                 General Comments: difficult to verify information provided regarding PLOF   General Comments  visible areas c/d/i; at risk for skin breakdown due to decreased mobility    Exercises     Shoulder Instructions      Home Living Family/patient expects to be discharged to:: Private residence Living Arrangements: Spouse/significant other;Children (per chart review, son recently moved in) Available Help at Discharge: Family;Available PRN/intermittently;Personal care attendant (5x/wk through [facility], recently started) Type of Home: House (farm house) Home Access: Ramped entrance     Home Layout: One level     Bathroom Shower/Tub: Walk-in shower;Door   Bathroom Toilet: Handicapped height     Home Equipment: Environmental consultant - 2 wheels;Cane - single point;Grab bars - tub/shower;Shower seat          Prior Functioning/Environment Level of Independence: Needs assistance  Gait / Transfers Assistance Needed: no help getting out of bed or standing; uses RW  ADL's / Homemaking Assistance Needed: assist needed Communication / Swallowing Assistance Needed: decreased short term memory, HOH; unreliable historian Comments: Had just started having aides 5d/wk to  assist with bathing, cooking, and cleaning; reports she still drives occasionally and uses electric shopping cart when getting groceries        OT Problem List: Decreased strength;Pain;Decreased cognition;Decreased activity tolerance;Decreased safety awareness;Impaired balance (sitting and/or standing);Decreased knowledge of use of DME or AE      OT Treatment/Interventions: Self-care/ADL training;Therapeutic exercise;Therapeutic activities;Energy conservation;DME and/or AE instruction;Patient/family education    OT Goals(Current goals can be found in the care plan section) Acute Rehab OT Goals Patient Stated Goal: get better OT Goal Formulation: With patient Time For Goal Achievement: 02/19/17 Potential to Achieve Goals: Good  OT Frequency: Min 1X/week   Barriers to D/C:            Co-evaluation PT/OT/SLP Co-Evaluation/Treatment: Yes Reason for Co-Treatment: For patient/therapist safety PT goals addressed during session: Proper use of DME;Mobility/safety with mobility OT goals addressed during session: Proper use of Adaptive equipment and DME;ADL's and self-care      AM-PAC PT "6 Clicks" Daily Activity     Outcome Measure Help from another person eating meals?: None Help from another person taking care of personal grooming?: None Help from another person toileting, which includes using toliet, bedpan, or urinal?: A Little Help from another person bathing (including washing, rinsing, drying)?: A Little Help from another person to put on and taking off regular upper body clothing?: None Help from another person to put on and taking off regular lower body clothing?: A Little 6 Click Score: 21  End of Session Equipment Utilized During Treatment: Gait belt;Rolling walker  Activity Tolerance: Patient limited by pain;Patient limited by fatigue Patient left: in bed;with call bell/phone within reach;with bed alarm set;with family/visitor present  OT Visit Diagnosis: Other  abnormalities of gait and mobility (R26.89);Repeated falls (R29.6);Muscle weakness (generalized) (M62.81);History of falling (Z91.81);Pain Pain - Right/Left: Left Pain - part of body: Hip                Time: 4098-1191 OT Time Calculation (min): 27 min Charges:  OT General Charges $OT Visit: 1 Procedure OT Evaluation $OT Eval Low Complexity: 1 Procedure G-Codes:     Richrd Prime, MPH, MS, OTR/L ascom [phone] 02/05/17, 2:29 PM

## 2017-02-05 NOTE — Clinical Social Work Note (Signed)
Clinical Social Work Assessment  Patient Details  Name: Theresa Frank MRN: [id number] Date of Birth: 1924/10/02  Date of referral:  02/05/17               Reason for consult:  Facility Placement                Permission sought to share information with:  Oceanographer granted to share information::  Yes, Verbal Permission Granted  Name::        Agency::     Relationship::     Contact Information:     Housing/Transportation Living arrangements for the past 2 months:  Single Family Home Source of Information:  Adult Children, Spouse Patient Interpreter Needed:  None Criminal Activity/Legal Involvement Pertinent to Current Situation/Hospitalization:  No - Comment as needed Significant Relationships:  Adult Children, Spouse Lives with:  Spouse Do you feel safe going back to the place where you live?  Yes Need for family participation in patient care:  No (Coment)  Care giving concerns:  PT recommendation for STR  Social Worker assessment / plan:  CSW attempted to visit patient at bedside; the patient was not able to be roused and was sleeping soundly. The CSW called the patient's spouse. Theresa Frank asked the CSW to contact his son due to Theresa Frank being hard of hearing. The CSW contacted [facility]. Theresa Frank gave verbal permission to conduct SNF bed referral. CSW explained the referral process. Theresa Frank reported that he is interested in [facility]. The CSW advised that the list of options would be on the patient's chart as the patient's son will not be here until the evening. Theresa Frank thanked the CSW.   Employment status:  Retired Database administrator PT Recommendations:  Skilled Nursing Facility Information / Referral to community resources:  Skilled Nursing Facility  Patient/Family's Response to care:  The patient's family thanked the CSW.  Patient/Family's Understanding of and Emotional Response to Diagnosis, Current Treatment, and Prognosis:   The patient's family understands the need for this level of care and are in agreement.  Emotional Assessment Appearance:  Appears stated age Attitude/Demeanor/Rapport:  Lethargic Affect (typically observed):   (Patient did not rouse, sleeping soundly.) Orientation:   (Patient did not rouse, sleeping soundly.) Alcohol / Substance use:  Never Used Psych involvement (Current and /or in the community):  No (Comment)  Discharge Needs  Concerns to be addressed:  Care Coordination, Discharge Planning Concerns Readmission within the last 30 days:  No Current discharge risk:  Chronically ill Barriers to Discharge:  Continued Medical Work up   [employer], LCSW 02/05/2017, 3:55 PM

## 2017-02-05 NOTE — Progress Notes (Signed)
Shift assessment completed. Pt is alert to self and place, is able to describe fall from earlier in the week. Pt rating pain at 0 while lying still. Pt is on room air, lungs clear bilat, Hr is regular, abdomen is soft, bs heard. Pt is using bedpan prn to void,ppp, no edema noted. Pt is cachectic in appearance. piv #20 intact to l fa, site is free of redness and swelling. Since assessment, pt has only c./o pain while using the bedpan, is able to roll to her l side in the bed without pain. Call bell in reach.

## 2017-02-05 NOTE — Care Management CC44 (Signed)
Condition Code 44 Documentation Completed  Patient Details  Name: Theresa Frank MRN: [id number] Date of Birth: 02-Nov-1924   Condition Code 44 given: yes   Patient signature on Condition Code 44 notice:   yes Documentation of 2 MD's agreement:yes    Code 44 added to claim:   yes    Caren Macadam, RN 02/05/2017, 7:30 PM

## 2017-02-05 NOTE — Plan of Care (Signed)
Problem: Bowel/Gastric: Goal: Will not experience complications related to bowel motility Outcome: Progressing Pt is progressing toward goals. Pt has dementia and requires family intervention to navigate information and plan of care.

## 2017-02-05 NOTE — Care Management CC44 (Signed)
Condition Code 44 Documentation Completed  Patient Details  Name: Theresa Frank MRN: [id number] Date of Birth: [birth year]   Condition Code 44 given:   yes Patient signature on Condition Code 44 notice:   Son Theresa Frank gave telephone permission Documentation of 2 MD's agreement:   Yes Code 44 added to claim:   Yes    Shawne Eskelson A, RN 02/05/2017, 4:27 PM

## 2017-02-05 NOTE — NC FL2 (Signed)
[place] MEDICAID FL2 LEVEL OF CARE SCREENING TOOL     IDENTIFICATION  Patient Name: Theresa Frank Birthdate: [birth year] Sex: female Admission Date (Current Location): 02/04/2017  [place] and [place] Number:  Chiropodist and Address:  [facility], [address] [place], [place] [address]      Provider Number: [id number]  Attending Physician Name and Address:  Milagros Loll, MD  Relative Name and Phone Number:       Current Level of Care: Hospital Recommended Level of Care: Skilled Nursing Facility Prior Approval Number:    Date Approved/Denied:   PASRR Number: [id number]  Discharge Plan: SNF    Current Diagnoses: Patient Active Problem List   Diagnosis Date Noted  . Pelvic fracture (HCC) 02/04/2017  . Elevated troponin 02/04/2017  . Fall 01/11/2017  . TIA (transient ischemic attack) 01/10/2017  . HTN (hypertension) 01/10/2017  . Asthma 01/10/2017  . Alzheimer disease 01/10/2017    Orientation RESPIRATION BLADDER Height & Weight     Self, Situation  Normal Continent Weight: 111 lb 11.2 oz (50.7 kg) Height:  5\' 8"  (172.7 cm)  BEHAVIORAL SYMPTOMS/MOOD NEUROLOGICAL BOWEL NUTRITION STATUS      Continent Diet (Heart healthy)  AMBULATORY STATUS COMMUNICATION OF NEEDS Skin   Extensive Assist Verbally Bruising                       Personal Care Assistance Level of Assistance  Bathing, Feeding, Dressing Bathing Assistance: Limited assistance Feeding assistance: Independent Dressing Assistance: Limited assistance     Functional Limitations Info             SPECIAL CARE FACTORS FREQUENCY  PT (By licensed PT), OT (By licensed OT)     PT Frequency: Up to 5X per day, 5 days per week OT Frequency: Up to 5X per day, 5 days per week            Contractures Contractures Info: Not present    Additional Factors Info  Allergies   Allergies Info: Ciprofloxacin, Penicillins, Premarin Conjugated Estrogens, Septra  Sulfamethoxazole-trimethoprim, Zocor Simvastatin           Current Medications (02/05/2017):  This is the current hospital active medication list Current Facility-Administered Medications  Medication Dose Route Frequency Provider Last Rate Last Dose  . acetaminophen (TYLENOL) tablet 650 mg  650 mg Oral Q6H PRN Oralia Manis, MD       Or  . acetaminophen (TYLENOL) suppository 650 mg  650 mg Rectal Q6H PRN Oralia Manis, MD      . aspirin EC tablet 81 mg  81 mg Oral Daily Oralia Manis, MD   81 mg at 02/05/17 0915  . donepezil (ARICEPT) tablet 5 mg  5 mg Oral Lamont Snowball, MD   5 mg at 02/05/17 0002  . enoxaparin (LOVENOX) injection 40 mg  40 mg Subcutaneous Q24H Oralia Manis, MD      . memantine [facility]) tablet 10 mg  10 mg Oral BID Oralia Manis, MD   10 mg at 02/05/17 0914  . mirtazapine (REMERON) tablet 7.5 mg  7.5 mg Oral Lamont Snowball, MD   7.5 mg at 02/05/17 0002  . mometasone-formoterol (DULERA) 200-5 MCG/ACT inhaler 2 puff  2 puff Inhalation BID Oralia Manis, MD   2 puff at 02/05/17 0915  . montelukast (SINGULAIR) tablet 10 mg  10 mg Oral Daily Oralia Manis, MD   10 mg at 02/05/17 0914  . morphine 2 MG/ML injection 2 mg  2 mg Intravenous Q4H PRN Oralia Manis, MD      . ondansetron [facility]) tablet 4 mg  4 mg Oral Q6H PRN Oralia Manis, MD       Or  . ondansetron [facility]) injection 4 mg  4 mg Intravenous Q6H PRN Oralia Manis, MD      . oxyCODONE (Oxy IR/ROXICODONE) immediate release tablet 5 mg  5 mg Oral Q4H PRN Oralia Manis, MD   5 mg at 02/05/17 1610     Discharge Medications: Please see discharge summary for a list of discharge medications.  Relevant Imaging Results:  Relevant Lab Results:   Additional Information SS# [id number]  Judi Cong, LCSW

## 2017-02-05 NOTE — Progress Notes (Signed)
Pts. Second troponin level is 0.11. Dr. Anne Hahn notified and no new orders

## 2017-02-05 NOTE — Progress Notes (Signed)
OT Cancellation Note  Patient Details Name: Theresa Frank MRN: [id number] DOB: [birth year]   Cancelled Treatment:    Reason Eval/Treat Not Completed: Other (comment). Order received, chart reviewed. Pt with elevated troponin with unclear etiology at this time. Pending orthopedic consult to assess pelvic fracture. Will hold OT evaluation at this time and continue to monitor for updated plan of care and appropriateness of OT evaluation at later date/time.   Richrd Prime, MPH, MS, OTR/L ascom [phone] 02/05/17, 10:12 AM

## 2017-02-05 NOTE — Progress Notes (Signed)
No chest pain or shortness of breath. EKG shows no acute changes. Not MI. Incidental finding of mild elevation of troponin. Trending down.  Patient can work with physical therapy and occupational therapy.

## 2017-02-05 NOTE — Care Management Obs Status (Signed)
MEDICARE OBSERVATION STATUS NOTIFICATION   Patient Details  Name: Theresa Frank MRN: [id number] Date of Birth: Feb 19, 1925   Medicare Observation Status Notification Given:  Yes (MOON letter)    Jolee Ewing, RN 02/05/2017, 4:27 PM

## 2017-02-06 DIAGNOSIS — L8990 Pressure ulcer of unspecified site, unspecified stage: Secondary | ICD-10-CM | POA: Insufficient documentation

## 2017-02-06 NOTE — Progress Notes (Signed)
[facility] - [place] at [facility]   PATIENT NAME: Theresa Frank    MR#:  [id number]  DATE OF BIRTH:  1925-03-16  SUBJECTIVE:  CHIEF COMPLAINT:   Chief Complaint  Patient presents with  . possible pelvis fracture   Pleasantly confused.  No family at bedside. Pain medications being given as needed  REVIEW OF SYSTEMS:    Review of Systems  Unable to perform ROS: Dementia    DRUG ALLERGIES:   Allergies  Allergen Reactions  . Ciprofloxacin Other (See Comments)    Reaction: unknown  . Penicillins Other (See Comments)    Reaction: unknown Has patient had a PCN reaction causing immediate rash, facial/tongue/throat swelling, SOB or lightheadedness with hypotension: Unknown Has patient had a PCN reaction causing severe rash involving mucus membranes or skin necrosis: Unknown Has patient had a PCN reaction that required hospitalization: Unknown Has patient had a PCN reaction occurring within the last 10 years: Unknown If all of the above answers are "NO", then may proceed with Cephalosporin use.   . Premarin [Conjugated Estrogens] Other (See Comments)    Reaction: unknown  . Septra [Sulfamethoxazole-Trimethoprim] Other (See Comments)    Reaction: unknown  . Zocor [Simvastatin] Other (See Comments)    Reaction: unknown    VITALS:  Blood pressure 134/90, pulse 90, temperature 98.7 F (37.1 C), temperature source Oral, resp. rate 18, height 5\' 8"  (1.727 m), weight 50.7 kg (111 lb 11.2 oz), SpO2 98 %.  PHYSICAL EXAMINATION:   Physical Exam  GENERAL:  83 y.o.-year-old patient lying in the bed with no acute distress.  EYES: Pupils equal, round, reactive to light and accommodation. No scleral icterus. Extraocular muscles intact.  HEENT: Head atraumatic, normocephalic. Oropharynx and nasopharynx clear.  NECK:  Supple, no jugular venous distention. No thyroid enlargement, no tenderness.  LUNGS: Normal breath sounds bilaterally, no wheezing, rales, rhonchi. No use  of accessory muscles of respiration.  CARDIOVASCULAR: S1, S2 normal. No murmurs, rubs, or gallops.  ABDOMEN: Soft, nontender, nondistended. Bowel sounds present. No organomegaly or mass.  EXTREMITIES: No cyanosis, clubbing or edema b/l.    NEUROLOGIC: Cranial nerves II through XII are intact. No focal Motor or sensory deficits b/l.   PSYCHIATRIC: The patient is alert and awake SKIN: No obvious rash, lesion, or ulcer.  Tenderness left groin area  LABORATORY PANEL:   CBC  Recent Labs Lab 02/05/17 0545  WBC 6.3  HGB 10.5*  HCT 30.7*  PLT 244   ------------------------------------------------------------------------------------------------------------------ Chemistries   Recent Labs Lab 02/04/17 2032 02/05/17 0545  NA 137 137  K 3.8 3.5  CL 104 105  CO2 26 27  GLUCOSE 139* 91  BUN 21* 18  CREATININE 0.92 0.94  CALCIUM 8.7* 8.1*  AST 27  --   ALT 15  --   ALKPHOS 85  --   BILITOT 0.4  --    ------------------------------------------------------------------------------------------------------------------  Cardiac Enzymes  Recent Labs Lab 02/05/17 1129  TROPONINI 0.14*   ------------------------------------------------------------------------------------------------------------------  RADIOLOGY:  Dg Pelvis 1-2 Views  Result Date: 02/04/2017 CLINICAL DATA:  Left buttock and hip pain since a fall 02/02/2017. Initial encounter. EXAM: PELVIS - 1-2 VIEW COMPARISON:  None. FINDINGS: The patient has acute left parasymphyseal pubic bone fractures. No other fracture is identified. Convex left lumbar scoliosis and degenerative disease are noted. Atherosclerosis is identified. Mesh from prior hernia repair is noted. IMPRESSION: Acute left parasymphyseal pubic bone fractures. Convex left lumbar scoliosis and spondylosis. Atherosclerosis. Electronically Signed   By: Drusilla Kanner M.D.  On: 02/04/2017 19:11     ASSESSMENT AND PLAN:    * Pelvic fracture - orthopedics  consulted And appreciate input No surgery needed Needs skilled nursing facility at discharge pain control   * Elevated troponin  Trending down. Not MI. No chest pain or shortness of breath. EKG shows no acute changes.    *HTN (hypertension) - continue home meds    *Asthma - continue home meds   * Alzheimer disease - continue home medications  All the records are reviewed and case discussed with Care Management/Social Workerr. Management plans discussed with the patient, family and they are in agreement.  CODE STATUS: FULL CODE  DVT Prophylaxis: SCDs  TOTAL TIME TAKING CARE OF THIS PATIENT: 25 minutes.   POSSIBLE D/C IN 1-2 DAYS, DEPENDING ON CLINICAL CONDITION.  Milagros Loll R M.D on 02/06/2017 at 12:06 PM  Between 7am to 6pm - Pager - [phone]  After 6pm go to [email or web address] - password [email or web address] [facility]  [facility] [place] [facility]  Office  [phone]  CC: Primary care physician; Kandyce Rud, MD  Note: This dictation was prepared with Dragon dictation along with smaller phrase technology. Any transcriptional errors that result from this process are unintentional.

## 2017-02-06 NOTE — Plan of Care (Signed)
Problem: Safety: Goal: Ability to remain free from injury will improve Outcome: Progressing Bed in lowest position, call bell within reach and bed alarm on.    

## 2017-02-06 NOTE — Progress Notes (Signed)
Physical Therapy Treatment Patient Details Name: Theresa Frank MRN: [id number] DOB: 15-Jun-1925 Today's Date: 02/06/2017    History of Present Illness Patient is a 83 year old who suffered a fall 3 days prior to admission with subsequent difficulty with getting in and out of bed at home. She was brought to the emergency room and found to have a left pubic ramus fracture minimally displaced.     PT Comments    Pt demonstrates progress with physical therapy on this date. Improved ability to advance LLE during ambulation and increase in ambulation distance. She continues to demonstrates LLE weakness with WB. She is able to complete all supine and seated exercises without reports of increased pain. Pt remains limited in her mobility and will need SNF placement at discharge in order to facilitate safe transition home. Pt will benefit from skilled PT services to address deficits in strength, balance, and mobility in order to return to full function at home.     Follow Up Recommendations  SNF     Equipment Recommendations  Other (comment) (defer to post acute)    Recommendations for Other Services       Precautions / Restrictions Precautions Precautions: Fall Restrictions Weight Bearing Restrictions: Yes Other Position/Activity Restrictions: L WBAT    Mobility  Bed Mobility Overal bed mobility: Needs Assistance Bed Mobility: Supine to Sit     Supine to sit: Min assist     General bed mobility comments: Assist needed for moving L LE on/off bed and to elevate trunk/rotate hips to EOB  Transfers Overall transfer level: Needs assistance Equipment used: Rolling walker (2 wheeled) Transfers: Sit to/from Stand Sit to Stand: Min assist         General transfer comment: Pt provided verbal cues and only requires very minimal assist to raise up.  She is initially unsteady but eventually is able to stabilize with rolling walker  Ambulation/Gait Ambulation/Gait assistance: Min  assist Ambulation Distance (Feet): 20 Feet Assistive device: Rolling walker (2 wheeled) Gait Pattern/deviations: Step-to pattern;Shuffle Gait velocity: Decreased Gait velocity interpretation: <1.8 ft/sec, indicative of risk for recurrent falls General Gait Details: Pt demonstrates improved ability to progress LLE today during session. She ambulates to door and back to recliner with step-to pattern. Intermittent LE buckling but good UE support. No signs of respiratory distress but due to weakness ambulation not progressed further on this date.    Stairs            Wheelchair Mobility    Modified Rankin (Stroke Patients Only)       Balance Overall balance assessment: Needs assistance;History of Falls Sitting-balance support: Feet supported Sitting balance-Leahy Scale: Good     Standing balance support: Bilateral upper extremity supported;During functional activity Standing balance-Leahy Scale: Fair Standing balance comment: needs MinA during ambulation and UE support on rolling walker in standing                            Cognition Arousal/Alertness: Awake/alert Behavior During Therapy: WFL for tasks assessed/performed Overall Cognitive Status: History of cognitive impairments - at baseline                                 General Comments: AOx2      Exercises General Exercises - Lower Extremity Ankle Circles/Pumps: AROM;Both;15 reps;Supine Quad Sets: AROM;Both;15 reps;Supine Short Arc Quad: Strengthening;Left;15 reps;Supine Long Arc Quad: Strengthening;Left;15 reps;Seated Heel Slides: Strengthening;Left;15 reps;Supine  Hip ABduction/ADduction: Strengthening;Left;15 reps;Supine Straight Leg Raises: Strengthening;Left;15 reps;Supine Hip Flexion/Marching: Strengthening;Both;15 reps;Seated    General Comments        Pertinent Vitals/Pain Pain Assessment: Faces Faces Pain Scale: Hurts little more Pain Location: L posterior hip with  ambulation Pain Intervention(s): Monitored during session    Home Living                      Prior Function            PT Goals (current goals can now be found in the care plan section) Acute Rehab PT Goals Patient Stated Goal: get better PT Goal Formulation: With patient/family Time For Goal Achievement: 02/12/17 Potential to Achieve Goals: Good Progress towards PT goals: Progressing toward goals    Frequency    7X/week      PT Plan Current plan remains appropriate    Co-evaluation              AM-PAC PT "6 Clicks" Daily Activity  Outcome Measure  Difficulty turning over in bed (including adjusting bedclothes, sheets and blankets)?: Total Difficulty moving from lying on back to sitting on the side of the bed? : Total Difficulty sitting down on and standing up from a chair with arms (e.g., wheelchair, bedside commode, etc,.)?: Total Help needed moving to and from a bed to chair (including a wheelchair)?: A Little Help needed walking in hospital room?: A Little Help needed climbing 3-5 steps with a railing? : A Lot 6 Click Score: 11    End of Session Equipment Utilized During Treatment: Gait belt Activity Tolerance: Patient tolerated treatment well Patient left: with call bell/phone within reach;in chair;with chair alarm set;Other (comment) (CNA bringing coffee)   PT Visit Diagnosis: Muscle weakness (generalized) (M62.81);History of falling (Z91.81);Difficulty in walking, not elsewhere classified (R26.2);Pain Pain - Right/Left: Left Pain - part of body: Leg     Time: 1610-9604 PT Time Calculation (min) (ACUTE ONLY): 25 min  Charges:  $Gait Training: 8-22 mins $Therapeutic Exercise: 8-22 mins                    G Codes:       Sharalyn Ink Rayonna Heldman PT, DPT     Bobette Leyh 02/06/2017, 12:25 PM

## 2017-02-06 NOTE — Clinical Social Work Note (Signed)
CSW contacted the patient's son Theresa Frank to discuss bed offers. The patient's son chose [facility]. The CSW advised of the process for the day of discharge. The patient's son indicated that he plans to transport his mother to the facility at discharge. CSW will continue to follow for discharge facilitation; the facility has been accepted and selected via the HUB.  Theresa Frank, MSW, [name] [phone]

## 2017-02-07 MED ORDER — SENNOSIDES-DOCUSATE SODIUM 8.6-50 MG PO TABS
1.0000 | ORAL_TABLET | Freq: Two times a day (BID) | ORAL | Status: DC
  Administered 2017-02-07: 1 via ORAL
  Filled 2017-02-07: qty 1

## 2017-02-07 MED ORDER — MAGNESIUM HYDROXIDE 400 MG/5ML PO SUSP
30.0000 mL | Freq: Once | ORAL | Status: AC
  Administered 2017-02-07: 30 mL via ORAL
  Filled 2017-02-07: qty 30

## 2017-02-07 MED ORDER — BISACODYL 10 MG RE SUPP: 10.0000 mg | Freq: Every day | RECTAL | Status: DC | PRN

## 2017-02-07 MED ORDER — OXYCODONE HCL 5 MG PO TABS: 5.0000 mg | ORAL_TABLET | ORAL | 0 refills | Status: DC | PRN

## 2017-02-07 MED ORDER — SENNOSIDES-DOCUSATE SODIUM 8.6-50 MG PO TABS: 1.0000 | ORAL_TABLET | Freq: Every day | ORAL | Status: AC

## 2017-02-07 NOTE — NC FL2 (Signed)
[place] MEDICAID FL2 LEVEL OF CARE SCREENING TOOL     IDENTIFICATION  Patient Name: Theresa Frank Birthdate: June 24, 1925 Sex: female Admission Date (Current Location): 02/04/2017  [place] and [place] Number:  Chiropodist and Address:  [facility], [address] [place], [place] [address]      Provider Number: [id number]  Attending Physician Name and Address:  Milagros Loll, MD  Relative Name and Phone Number:       Current Level of Care: Hospital Recommended Level of Care: Skilled Nursing Facility Prior Approval Number:    Date Approved/Denied:   PASRR Number: [id number]  Discharge Plan: SNF    Current Diagnoses: Patient Active Problem List   Diagnosis Date Noted  . Pressure injury of skin 02/06/2017  . Pelvic fracture (HCC) 02/04/2017  . Elevated troponin 02/04/2017  . Fall 01/11/2017  . TIA (transient ischemic attack) 01/10/2017  . HTN (hypertension) 01/10/2017  . Asthma 01/10/2017  . Alzheimer disease 01/10/2017    Orientation RESPIRATION BLADDER Height & Weight     Self, Situation  Normal Continent Weight: 111 lb 11.2 oz (50.7 kg) Height:  5\' 8"  (172.7 cm)  BEHAVIORAL SYMPTOMS/MOOD NEUROLOGICAL BOWEL NUTRITION STATUS      Continent Diet (Heart healthy)  AMBULATORY STATUS COMMUNICATION OF NEEDS Skin   Extensive Assist Verbally Bruising                       Personal Care Assistance Level of Assistance  Bathing, Feeding, Dressing Bathing Assistance: Limited assistance Feeding assistance: Independent Dressing Assistance: Limited assistance     Functional Limitations Info             SPECIAL CARE FACTORS FREQUENCY  PT (By licensed PT), OT (By licensed OT)     PT Frequency: Up to 5X per day, 5 days per week OT Frequency: Up to 5X per day, 5 days per week            Contractures Contractures Info: Not present    Additional Factors Info  Allergies   Allergies Info: Ciprofloxacin, Penicillins,  Premarin Conjugated Estrogens, Septra Sulfamethoxazole-trimethoprim, Zocor Simvastatin           Current Medications (02/07/2017):  This is the current hospital active medication list Current Facility-Administered Medications  Medication Dose Route Frequency Provider Last Rate Last Dose  . acetaminophen (TYLENOL) tablet 650 mg  650 mg Oral Q6H PRN Oralia Manis, MD       Or  . acetaminophen (TYLENOL) suppository 650 mg  650 mg Rectal Q6H PRN Oralia Manis, MD      . aspirin EC tablet 81 mg  81 mg Oral Daily Oralia Manis, MD   81 mg at 02/07/17 0830  . bisacodyl (DULCOLAX) suppository 10 mg  10 mg Rectal Daily PRN Sudini, Wardell Heath, MD      . donepezil (ARICEPT) tablet 5 mg  5 mg Oral Lamont Snowball, MD   5 mg at 02/06/17 2020  . enoxaparin (LOVENOX) injection 40 mg  40 mg Subcutaneous Q24H Oralia Manis, MD   40 mg at 02/06/17 2020  . memantine (NAMENDA) tablet 10 mg  10 mg Oral BID Oralia Manis, MD   10 mg at 02/07/17 0831  . mirtazapine (REMERON) tablet 7.5 mg  7.5 mg Oral Lamont Snowball, MD   7.5 mg at 02/06/17 2021  . mometasone-formoterol (DULERA) 200-5 MCG/ACT inhaler 2 puff  2 puff Inhalation BID Oralia Manis, MD   2 puff at 02/07/17 0831  .  montelukast (SINGULAIR) tablet 10 mg  10 mg Oral Daily Oralia Manis, MD   10 mg at 02/07/17 0830  . morphine 2 MG/ML injection 2 mg  2 mg Intravenous Q4H PRN Oralia Manis, MD      . ondansetron [facility]) tablet 4 mg  4 mg Oral Q6H PRN Oralia Manis, MD       Or  . ondansetron [facility]) injection 4 mg  4 mg Intravenous Q6H PRN Oralia Manis, MD      . oxyCODONE (Oxy IR/ROXICODONE) immediate release tablet 5 mg  5 mg Oral Q4H PRN Oralia Manis, MD   5 mg at 02/07/17 0513  . senna-docusate (Senokot-S) tablet 1 tablet  1 tablet Oral BID Milagros Loll, MD   1 tablet at 02/07/17 0830     Discharge Medications: Please see discharge summary for a list of discharge medications.  Relevant Imaging Results:  Relevant Lab Results:   Additional  Information SS# [id number]  Raiven Belizaire, Darleen Crocker, [place]

## 2017-02-07 NOTE — Discharge Instructions (Signed)
Full weight bearing with assistance  Regular diet

## 2017-02-07 NOTE — Progress Notes (Signed)
Patient is medically stable for D/C to [facility] today. Per Jomarie Longs [facility] liaison patient can come today to room 714. RN will call report to RN Konrad Dolores at [phone] and patient's son Ronniesha Webb will transport patient between 12 noon and 1 pm today. Clinical Child psychotherapist (CSW) sent D/C orders to [facility] via HUB. Patient is aware of above. CSW contacted patient's son Raiford Noble and made him aware of above. Per Raiford Noble he will transport patient to [facility] today [facility]. Please reconsult if future social work needs arise. CSW signing off.   [employer], LCSW [phone]

## 2017-02-07 NOTE — Progress Notes (Signed)
Physical Therapy Treatment Patient Details Name: Theresa Frank MRN: [id number] DOB: 1924-11-28 Today's Date: 02/07/2017    History of Present Illness Patient is a 83 year old who suffered a fall 3 days prior to admission with subsequent difficulty with getting in and out of bed at home. She was brought to the emergency room and found to have a left pubic ramus fracture minimally displaced.     PT Comments    Pt agreeable to PT; reports pain in Left lower extremity continues, more so with ambulation and moving in/out of bed. Pt requires Min A for bed mobility, Mod A for transfers and ambulation. Difficulty attaining full stand with increased time and assist/cues. Poor safety awareness noted with pt reaching out of base of support and rolling walker to chair for premature sitting. Education regarding safety to avoid falls. Pt participates in seated and long sit exercises with assist as needed. Cues for improved technique throughout. At the time of this document, pt discharging to skilled nursing facility today to continue rehab efforts.   Follow Up Recommendations  SNF     Equipment Recommendations       Recommendations for Other Services       Precautions / Restrictions Precautions Precautions: Fall Restrictions Weight Bearing Restrictions: Yes Other Position/Activity Restrictions: L WBAT    Mobility  Bed Mobility Overal bed mobility: Needs Assistance Bed Mobility: Supine to Sit     Supine to sit: Min assist     General bed mobility comments: for LEs  Transfers Overall transfer level: Needs assistance Equipment used: Rolling walker (2 wheeled) Transfers: Sit to/from Stand Sit to Stand: Mod assist         General transfer comment: Mod to stand; cues for hands to walker once standing and for upright posture. Increased time to attain full stand and improved balance.  Ambulation/Gait Ambulation/Gait assistance: Mod assist Ambulation Distance (Feet): 5 Feet Assistive  device: Rolling walker (2 wheeled) Gait Pattern/deviations: Decreased weight shift to left;Decreased stance time - left;Antalgic;Trunk flexed (poor sequene) Gait velocity: Decreased Gait velocity interpretation: <1.8 ft/sec, indicative of risk for recurrent falls General Gait Details: Poor sequence, retropulsive, assist for rw advancement. Poor chair approach and sit with pt reaching to chair to sit prematuely despite cues   Stairs            Wheelchair Mobility    Modified Rankin (Stroke Patients Only)       Balance   Sitting-balance support: Feet supported;Bilateral upper extremity supported Sitting balance-Leahy Scale: Fair     Standing balance support: Bilateral upper extremity supported;During functional activity Standing balance-Leahy Scale: Poor                              Cognition Arousal/Alertness: Awake/alert Behavior During Therapy: WFL for tasks assessed/performed Overall Cognitive Status: Within Functional Limits for tasks assessed                                        Exercises General Exercises - Lower Extremity Quad Sets: Strengthening;Both;10 reps Gluteal Sets: Strengthening;Both;10 reps Long Arc Quad: AROM;Strengthening;Both;10 reps;Seated Heel Slides: AROM;Both;10 reps Hip ABduction/ADduction: AROM;Strengthening;Both;10 reps;Seated (leg straight; performed single) Straight Leg Raises: AAROM;Both;10 reps Hip Flexion/Marching: AROM;Both;10 reps;Seated Toe Raises: AROM;Both;10 reps;Seated Heel Raises: AROM;Both;10 reps;Seated    General Comments        Pertinent Vitals/Pain Pain Assessment: Faces Faces Pain  Scale: Hurts little more Pain Location: L hip/LE (with ambulation more so; milder with exercise and at rest) Pain Descriptors / Indicators:  ("aggravating") Pain Intervention(s): Limited activity within patient's tolerance;Monitored during session;Repositioned    Home Living                       Prior Function            PT Goals (current goals can now be found in the care plan section) Progress towards PT goals: Progressing toward goals (overall, but required more assist this session)    Frequency    7X/week      PT Plan Current plan remains appropriate    Co-evaluation              AM-PAC PT "6 Clicks" Daily Activity  Outcome Measure  Difficulty turning over in bed (including adjusting bedclothes, sheets and blankets)?: Total Difficulty moving from lying on back to sitting on the side of the bed? : Total Difficulty sitting down on and standing up from a chair with arms (e.g., wheelchair, bedside commode, etc,.)?: Total Help needed moving to and from a bed to chair (including a wheelchair)?: A Lot Help needed walking in hospital room?: A Lot Help needed climbing 3-5 steps with a railing? : Total 6 Click Score: 8    End of Session Equipment Utilized During Treatment: Gait belt Activity Tolerance: Patient tolerated treatment well Patient left: in chair;with call bell/phone within reach;with chair alarm set   PT Visit Diagnosis: Muscle weakness (generalized) (M62.81);History of falling (Z91.81);Difficulty in walking, not elsewhere classified (R26.2);Pain Pain - Right/Left: Left Pain - part of body: Leg     Time: 1005-1040 PT Time Calculation (min) (ACUTE ONLY): 35 min  Charges:  $Gait Training: 8-22 mins $Therapeutic Exercise: 8-22 mins                    G Codes:        Scot Dock, PTA 02/07/2017, 11:31 AM

## 2017-02-07 NOTE — Discharge Summary (Signed)
[facility] - [place] at [facility]   PATIENT NAME: Theresa Frank    MR#:  [id number]  DATE OF BIRTH:  1925-03-26  DATE OF ADMISSION:  02/04/2017 ADMITTING PHYSICIAN: Oralia Manis, MD  DATE OF DISCHARGE: 02/07/2017  PRIMARY CARE PHYSICIAN: Kandyce Rud, MD   ADMISSION DIAGNOSIS:  Fall, initial encounter [W19.XXXA] Pelvic ring fracture, closed, initial encounter (HCC) [S32.810A]  DISCHARGE DIAGNOSIS:  Principal Problem:   Pelvic fracture (HCC) Active Problems:   HTN (hypertension)   Asthma   Alzheimer disease   Elevated troponin   Pressure injury of skin   SECONDARY DIAGNOSIS:   Past Medical History:  Diagnosis Date  . Alzheimer's dementia, late onset, with behavioral disturbance   . Asthma   . Colon polyps   . Diverticulosis   . Hypertension   . MI (myocardial infarction) (HCC)      ADMITTING HISTORY  HISTORY OF PRESENT ILLNESS:  Theresa Frank  is a 83 y.o. female who presents with Fall and subsequent pelvic fracture. Patient was standing up trying to reach her walker and fell. Imaging here shows left parasymphyseal fractures.  She also mildly elevated troponin 0.16. Patient had a difficult time walking even with assistance. Hospitalists were called for admission.  HOSPITAL COURSE:   *Pelvic fracture - orthopedics consulted And appreciate input No surgery needed. Seen by Dr. Rosita Kea. Needs skilled nursing facility at discharge pain control with oxycodone as needed  *Elevated troponin  Trended down. Not MI. No chest pain or shortness of breath. EKG shows no acute changes.  *HTN (hypertension) - continue home meds  *Asthma- stable  *Alzheimer disease - continue medications  Stable for discharge to SNF  CONSULTS OBTAINED:  Treatment Team:  Kennedy Bucker, MD  DRUG ALLERGIES:   Allergies  Allergen Reactions  . Ciprofloxacin Other (See Comments)    Reaction: unknown  . Penicillins Other (See Comments)    Reaction: unknown Has  patient had a PCN reaction causing immediate rash, facial/tongue/throat swelling, SOB or lightheadedness with hypotension: Unknown Has patient had a PCN reaction causing severe rash involving mucus membranes or skin necrosis: Unknown Has patient had a PCN reaction that required hospitalization: Unknown Has patient had a PCN reaction occurring within the last 10 years: Unknown If all of the above answers are "NO", then may proceed with Cephalosporin use.   . Premarin [Conjugated Estrogens] Other (See Comments)    Reaction: unknown  . Septra [Sulfamethoxazole-Trimethoprim] Other (See Comments)    Reaction: unknown  . Zocor [Simvastatin] Other (See Comments)    Reaction: unknown    DISCHARGE MEDICATIONS:   Current Discharge Medication List    START taking these medications   Details  oxyCODONE (OXY IR/ROXICODONE) 5 MG immediate release tablet Take 1 tablet (5 mg total) by mouth every 4 (four) hours as needed for moderate pain. Qty: 30 tablet, Refills: 0    senna-docusate (SENOKOT-S) 8.6-50 MG tablet Take 1 tablet by mouth at bedtime.      CONTINUE these medications which have NOT CHANGED   Details  aspirin EC 81 MG EC tablet Take 1 tablet (81 mg total) by mouth daily.    donepezil (ARICEPT) 5 MG tablet Take 5 mg by mouth at bedtime.    Fluticasone-Salmeterol (ADVAIR DISKUS) 250-50 MCG/DOSE AEPB Inhale 1 puff into the lungs 2 (two) times daily.    memantine (NAMENDA) 10 MG tablet Take 10 mg by mouth 2 (two) times daily.    mirtazapine (REMERON) 15 MG tablet Take 7.5 mg by mouth at bedtime.  montelukast (SINGULAIR) 10 MG tablet Take 10 mg by mouth daily.        Today   VITAL SIGNS:  Blood pressure 122/76, pulse 90, temperature 99.1 F (37.3 C), temperature source Oral, resp. rate 16, height 5\' 8"  (1.727 m), weight 50.7 kg (111 lb 11.2 oz), SpO2 97 %.  I/O:   Intake/Output Summary (Last 24 hours) at 02/07/17 0826 Last data filed at 02/06/17 2249  Gross per 24 hour   Intake              120 ml  Output              600 ml  Net             -480 ml    PHYSICAL EXAMINATION:  Physical Exam  GENERAL:  83 y.o.-year-old patient lying in the bed with no acute distress.  LUNGS: Normal breath sounds bilaterally, no wheezing, rales,rhonchi or crepitation. No use of accessory muscles of respiration.  CARDIOVASCULAR: S1, S2 normal. No murmurs, rubs, or gallops.  ABDOMEN: Soft, non-tender, non-distended. Bowel sounds present. No organomegaly or mass.  NEUROLOGIC: Moves all 4 extremities. PSYCHIATRIC: The patient is alert and awake. Pleasantly confused SKIN: No obvious rash, lesion, or ulcer.   DATA REVIEW:   CBC  Recent Labs Lab 02/05/17 0545  WBC 6.3  HGB 10.5*  HCT 30.7*  PLT 244    Chemistries   Recent Labs Lab 02/04/17 2032 02/05/17 0545  NA 137 137  K 3.8 3.5  CL 104 105  CO2 26 27  GLUCOSE 139* 91  BUN 21* 18  CREATININE 0.92 0.94  CALCIUM 8.7* 8.1*  AST 27  --   ALT 15  --   ALKPHOS 85  --   BILITOT 0.4  --     Cardiac Enzymes  Recent Labs Lab 02/05/17 1129  TROPONINI 0.14*    Microbiology Results  No results found for this or any previous visit.  RADIOLOGY:  No results found.  Follow up with PCP in 1 week.  Management plans discussed with the patient, family and they are in agreement.  CODE STATUS:     Code Status Orders        Start     Ordered   02/04/17 2342  Full code  Continuous     02/04/17 2341    Code Status History    Date Active Date Inactive Code Status Order ID Comments User Context   01/11/2017  5:02 AM 01/12/2017  5:51 PM Full Code [id number]  Arnaldo Natal, MD Inpatient    Advance Directive Documentation     Most Recent Value  Type of Advance Directive  Healthcare Power of Attorney  Pre-existing out of facility DNR order (yellow form or pink MOST form)  -  "MOST" Form in Place?  -      TOTAL TIME TAKING CARE OF THIS PATIENT ON DAY OF DISCHARGE: more than 30 minutes.   Milagros Loll R M.D on 02/07/2017 at 8:26 AM  Between 7am to 6pm - Pager - [phone]  After 6pm go to [email or web address] - password [email or web address] [facility]  [facility] [place] [facility]  Office  [phone]  CC: Primary care physician; Kandyce Rud, MD  Note: This dictation was prepared with Dragon dictation along with smaller phrase technology. Any transcriptional errors that result from this process are unintentional.

## 2017-02-07 NOTE — Progress Notes (Signed)
Patient alert and oriented, not complaining of any pain at this time. Patient has full movement and sensation of left leg, pain when moving. Lung and heart sounds normal. Patient reoriented to call bell system, sitting up in bed eating dinner.  Harvie Heck, RN

## 2017-02-07 NOTE — Progress Notes (Signed)
Report called to RN at [facility]. All questions answered. AVS in packet, son will be transporting patient.   Harvie Heck, RN

## 2017-03-14 ENCOUNTER — Encounter: Payer: Self-pay | Admitting: Emergency Medicine

## 2017-03-14 ENCOUNTER — Inpatient Hospital Stay
Admission: EM | Admit: 2017-03-14 | Discharge: 2017-03-16 | DRG: 956 | Disposition: A | Discharge status: Skilled Nursing Facility | Payer: Medicare Other | Attending: Internal Medicine | Admitting: Internal Medicine

## 2017-03-14 ENCOUNTER — Inpatient Hospital Stay: Payer: Medicare Other | Admitting: Anesthesiology

## 2017-03-14 ENCOUNTER — Emergency Department: Payer: Medicare Other

## 2017-03-14 ENCOUNTER — Inpatient Hospital Stay: Payer: Medicare Other

## 2017-03-14 ENCOUNTER — Encounter
Admission: EM | Disposition: A | Discharge status: Skilled Nursing Facility | Payer: Self-pay | Source: Home / Self Care | Attending: Internal Medicine

## 2017-03-14 DIAGNOSIS — E869 Volume depletion, unspecified: Secondary | ICD-10-CM | POA: Diagnosis not present

## 2017-03-14 DIAGNOSIS — I959 Hypotension, unspecified: Secondary | ICD-10-CM | POA: Diagnosis not present

## 2017-03-14 DIAGNOSIS — Z7982 Long term (current) use of aspirin: Secondary | ICD-10-CM

## 2017-03-14 DIAGNOSIS — E43 Unspecified severe protein-calorie malnutrition: Secondary | ICD-10-CM | POA: Diagnosis present

## 2017-03-14 DIAGNOSIS — Z79899 Other long term (current) drug therapy: Secondary | ICD-10-CM

## 2017-03-14 DIAGNOSIS — Z96649 Presence of unspecified artificial hip joint: Secondary | ICD-10-CM

## 2017-03-14 DIAGNOSIS — D62 Acute posthemorrhagic anemia: Secondary | ICD-10-CM | POA: Diagnosis not present

## 2017-03-14 DIAGNOSIS — S32502A Unspecified fracture of left pubis, initial encounter for closed fracture: Secondary | ICD-10-CM | POA: Diagnosis present

## 2017-03-14 DIAGNOSIS — I10 Essential (primary) hypertension: Secondary | ICD-10-CM | POA: Diagnosis present

## 2017-03-14 DIAGNOSIS — I252 Old myocardial infarction: Secondary | ICD-10-CM | POA: Diagnosis not present

## 2017-03-14 DIAGNOSIS — Z66 Do not resuscitate: Secondary | ICD-10-CM | POA: Diagnosis present

## 2017-03-14 DIAGNOSIS — S72002A Fracture of unspecified part of neck of left femur, initial encounter for closed fracture: Principal | ICD-10-CM | POA: Diagnosis present

## 2017-03-14 DIAGNOSIS — Z9181 History of falling: Secondary | ICD-10-CM | POA: Diagnosis not present

## 2017-03-14 DIAGNOSIS — Z881 Allergy status to other antibiotic agents status: Secondary | ICD-10-CM

## 2017-03-14 DIAGNOSIS — Z681 Body mass index (BMI) 19 or less, adult: Secondary | ICD-10-CM | POA: Diagnosis not present

## 2017-03-14 DIAGNOSIS — Z880 Allergy status to penicillin: Secondary | ICD-10-CM

## 2017-03-14 DIAGNOSIS — G301 Alzheimer's disease with late onset: Secondary | ICD-10-CM | POA: Diagnosis present

## 2017-03-14 DIAGNOSIS — F0280 Dementia in other diseases classified elsewhere without behavioral disturbance: Secondary | ICD-10-CM | POA: Diagnosis present

## 2017-03-14 DIAGNOSIS — I2510 Atherosclerotic heart disease of native coronary artery without angina pectoris: Secondary | ICD-10-CM | POA: Diagnosis present

## 2017-03-14 DIAGNOSIS — W19XXXA Unspecified fall, initial encounter: Secondary | ICD-10-CM | POA: Diagnosis present

## 2017-03-14 DIAGNOSIS — R296 Repeated falls: Secondary | ICD-10-CM | POA: Diagnosis present

## 2017-03-14 DIAGNOSIS — J45909 Unspecified asthma, uncomplicated: Secondary | ICD-10-CM | POA: Diagnosis present

## 2017-03-14 DIAGNOSIS — Z888 Allergy status to other drugs, medicaments and biological substances status: Secondary | ICD-10-CM | POA: Diagnosis not present

## 2017-03-14 DIAGNOSIS — M25552 Pain in left hip: Secondary | ICD-10-CM | POA: Diagnosis present

## 2017-03-14 DIAGNOSIS — I219 Acute myocardial infarction, unspecified: Secondary | ICD-10-CM | POA: Insufficient documentation

## 2017-03-14 DIAGNOSIS — Z8673 Personal history of transient ischemic attack (TIA), and cerebral infarction without residual deficits: Secondary | ICD-10-CM | POA: Diagnosis not present

## 2017-03-14 HISTORY — PX: HIP ARTHROPLASTY: SHX981

## 2017-03-14 LAB — CBC WITH DIFFERENTIAL/PLATELET
Basophils Absolute: 0.1 10*3/uL (ref 0–0.1)
Basophils Relative: 1 %
Eosinophils Absolute: 0 10*3/uL (ref 0–0.7)
Eosinophils Relative: 0 %
HEMATOCRIT: 35.3 % (ref 35.0–47.0)
HEMOGLOBIN: 12 g/dL (ref 12.0–16.0)
LYMPHS PCT: 6 %
Lymphs Abs: 0.7 10*3/uL — ABNORMAL LOW (ref 1.0–3.6)
MCH: 30.1 pg (ref 26.0–34.0)
MCHC: 33.9 g/dL (ref 32.0–36.0)
MCV: 88.7 fL (ref 80.0–100.0)
MONO ABS: 0.5 10*3/uL (ref 0.2–0.9)
MONOS PCT: 4 %
NEUTROS ABS: 10.8 10*3/uL — AB (ref 1.4–6.5)
Neutrophils Relative %: 89 %
Platelets: 208 10*3/uL (ref 150–440)
RBC: 3.97 MIL/uL (ref 3.80–5.20)
RDW: 17.5 % — AB (ref 11.5–14.5)
WBC: 12 10*3/uL — ABNORMAL HIGH (ref 3.6–11.0)

## 2017-03-14 LAB — BASIC METABOLIC PANEL
Anion gap: 8 (ref 5–15)
BUN: 21 mg/dL — ABNORMAL HIGH (ref 6–20)
CALCIUM: 9 mg/dL (ref 8.9–10.3)
CHLORIDE: 103 mmol/L (ref 101–111)
CO2: 27 mmol/L (ref 22–32)
CREATININE: 0.82 mg/dL (ref 0.44–1.00)
GFR calc Af Amer: >60 mL/min (ref >60)
GFR calc non Af Amer: >60 mL/min (ref >60)
GLUCOSE: 109 mg/dL — AB (ref 65–99)
Potassium: 4.2 mmol/L (ref 3.5–5.1)
Sodium: 138 mmol/L (ref 135–145)

## 2017-03-14 LAB — HEMOGLOBIN AND HEMATOCRIT, BLOOD
HCT: 32.4 % — ABNORMAL LOW (ref 35.0–47.0)
HEMOGLOBIN: 10.9 g/dL — AB (ref 12.0–16.0)

## 2017-03-14 LAB — SURGICAL PCR SCREEN
MRSA, PCR: NEGATIVE
STAPHYLOCOCCUS AUREUS: NEGATIVE

## 2017-03-14 LAB — TROPONIN I: Troponin I: 0.04 ng/mL (ref <0.03)

## 2017-03-14 LAB — CK: Total CK: 49 U/L (ref 38–234)

## 2017-03-14 LAB — PROTIME-INR
INR: 1.12
PROTHROMBIN TIME: 14.5 s (ref 11.4–15.2)

## 2017-03-14 SURGERY — HEMIARTHROPLASTY, HIP, DIRECT ANTERIOR APPROACH, FOR FRACTURE
Anesthesia: General | Site: Hip | Laterality: Left | Wound class: Clean

## 2017-03-14 MED ORDER — LIDOCAINE HCL (CARDIAC) 20 MG/ML IV SOLN
INTRAVENOUS | Status: DC | PRN
  Administered 2017-03-14: 60 mg via INTRAVENOUS

## 2017-03-14 MED ORDER — SODIUM CHLORIDE 0.9 % IV SOLN
INTRAVENOUS | Status: DC
  Administered 2017-03-14 – 2017-03-15 (×4): via INTRAVENOUS

## 2017-03-14 MED ORDER — CLINDAMYCIN PHOSPHATE 600 MG/50ML IV SOLN
600.0000 mg | Freq: Four times a day (QID) | INTRAVENOUS | Status: DC
  Administered 2017-03-14: 600 mg via INTRAVENOUS
  Filled 2017-03-14 (×2): qty 50

## 2017-03-14 MED ORDER — DEXTROSE 5 % IV SOLN: 580.0000 mg | Freq: Once | INTRAVENOUS | Status: DC

## 2017-03-14 MED ORDER — POLYETHYLENE GLYCOL 3350 17 G PO PACK: 17.0000 g | PACK | Freq: Every day | ORAL | Status: DC | PRN

## 2017-03-14 MED ORDER — DEXAMETHASONE SODIUM PHOSPHATE 10 MG/ML IJ SOLN
INTRAMUSCULAR | Status: AC
Start: 2017-03-14 — End: 2017-03-14
  Filled 2017-03-14: qty 1

## 2017-03-14 MED ORDER — MAGNESIUM HYDROXIDE 400 MG/5ML PO SUSP: 30.0000 mL | Freq: Every day | ORAL | Status: DC | PRN

## 2017-03-14 MED ORDER — ONDANSETRON HCL 4 MG/2ML IJ SOLN: 4.0000 mg | Freq: Once | INTRAMUSCULAR | Status: DC | PRN

## 2017-03-14 MED ORDER — PHENYLEPHRINE HCL 10 MG/ML IJ SOLN
INTRAMUSCULAR | Status: DC | PRN
  Administered 2017-03-14 (×8): 100 ug via INTRAVENOUS

## 2017-03-14 MED ORDER — CLINDAMYCIN PHOSPHATE 600 MG/50ML IV SOLN: 600.0000 mg | INTRAVENOUS | Status: DC

## 2017-03-14 MED ORDER — PROPOFOL 10 MG/ML IV BOLUS
INTRAVENOUS | Status: DC | PRN
  Administered 2017-03-14: 80 mg via INTRAVENOUS

## 2017-03-14 MED ORDER — ACETAMINOPHEN 325 MG PO TABS
650.0000 mg | ORAL_TABLET | Freq: Four times a day (QID) | ORAL | Status: DC | PRN
  Administered 2017-03-16: 650 mg via ORAL
  Filled 2017-03-14 (×2): qty 2

## 2017-03-14 MED ORDER — MEMANTINE HCL 5 MG PO TABS
10.0000 mg | ORAL_TABLET | Freq: Two times a day (BID) | ORAL | Status: DC
  Administered 2017-03-15 – 2017-03-16 (×3): 10 mg via ORAL
  Filled 2017-03-14 (×3): qty 2

## 2017-03-14 MED ORDER — SUCCINYLCHOLINE CHLORIDE 20 MG/ML IJ SOLN
INTRAMUSCULAR | Status: DC | PRN
  Administered 2017-03-14: 60 mg via INTRAVENOUS

## 2017-03-14 MED ORDER — MIRTAZAPINE 15 MG PO TABS
7.5000 mg | ORAL_TABLET | Freq: Every day | ORAL | Status: DC
  Administered 2017-03-15: 7.5 mg via ORAL
  Filled 2017-03-14: qty 1

## 2017-03-14 MED ORDER — ACETAMINOPHEN 10 MG/ML IV SOLN
INTRAVENOUS | Status: AC
  Administered 2017-03-14: 1000 mg via INTRAVENOUS
  Filled 2017-03-14: qty 100

## 2017-03-14 MED ORDER — TRAMADOL HCL 50 MG PO TABS: 50.0000 mg | ORAL_TABLET | ORAL | Status: DC | PRN

## 2017-03-14 MED ORDER — ACETAMINOPHEN 325 MG PO TABS: 650.0000 mg | ORAL_TABLET | Freq: Four times a day (QID) | ORAL | Status: DC | PRN

## 2017-03-14 MED ORDER — METOCLOPRAMIDE HCL 10 MG PO TABS
10.0000 mg | ORAL_TABLET | Freq: Three times a day (TID) | ORAL | Status: DC
  Administered 2017-03-15 – 2017-03-16 (×4): 10 mg via ORAL
  Filled 2017-03-14 (×4): qty 1

## 2017-03-14 MED ORDER — PHENYLEPHRINE HCL 10 MG/ML IJ SOLN
INTRAMUSCULAR | Status: AC
  Filled 2017-03-14: qty 1

## 2017-03-14 MED ORDER — FLEET ENEMA 7-19 GM/118ML RE ENEM: 1.0000 | ENEMA | Freq: Once | RECTAL | Status: DC | PRN

## 2017-03-14 MED ORDER — METOCLOPRAMIDE HCL 10 MG PO TABS: 5.0000 mg | ORAL_TABLET | Freq: Three times a day (TID) | ORAL | Status: DC | PRN

## 2017-03-14 MED ORDER — SODIUM CHLORIDE 0.9 % IV SOLN
INTRAVENOUS | Status: DC
  Administered 2017-03-14: 16:00:00 via INTRAVENOUS

## 2017-03-14 MED ORDER — ROCURONIUM BROMIDE 50 MG/5ML IV SOLN
INTRAVENOUS | Status: AC
  Filled 2017-03-14: qty 1

## 2017-03-14 MED ORDER — PHENOL 1.4 % MT LIQD
1.0000 | OROMUCOSAL | Status: DC | PRN
  Filled 2017-03-14: qty 177

## 2017-03-14 MED ORDER — ROCURONIUM BROMIDE 100 MG/10ML IV SOLN
INTRAVENOUS | Status: DC | PRN
  Administered 2017-03-14 (×3): 20 mg via INTRAVENOUS

## 2017-03-14 MED ORDER — OXYCODONE HCL 5 MG PO TABS
ORAL_TABLET | ORAL | Status: AC
  Administered 2017-03-14: 5 mg via ORAL
  Filled 2017-03-14: qty 1

## 2017-03-14 MED ORDER — ACETAMINOPHEN 650 MG RE SUPP
650.0000 mg | Freq: Four times a day (QID) | RECTAL | Status: DC | PRN
Start: 2017-03-14 — End: 2017-03-14

## 2017-03-14 MED ORDER — FERROUS SULFATE 325 (65 FE) MG PO TABS
325.0000 mg | ORAL_TABLET | Freq: Two times a day (BID) | ORAL | Status: DC
  Administered 2017-03-15 – 2017-03-16 (×3): 325 mg via ORAL
  Filled 2017-03-14 (×3): qty 1

## 2017-03-14 MED ORDER — PROPOFOL 10 MG/ML IV BOLUS
INTRAVENOUS | Status: AC
  Filled 2017-03-14: qty 20

## 2017-03-14 MED ORDER — ALBUTEROL SULFATE (2.5 MG/3ML) 0.083% IN NEBU: 2.5000 mg | INHALATION_SOLUTION | RESPIRATORY_TRACT | Status: DC | PRN

## 2017-03-14 MED ORDER — ONDANSETRON HCL 4 MG/2ML IJ SOLN
INTRAMUSCULAR | Status: DC | PRN
  Administered 2017-03-14: 4 mg via INTRAVENOUS

## 2017-03-14 MED ORDER — ASPIRIN EC 81 MG PO TBEC: 81.0000 mg | DELAYED_RELEASE_TABLET | Freq: Every day | ORAL | Status: DC

## 2017-03-14 MED ORDER — ONDANSETRON HCL 4 MG/2ML IJ SOLN: 4.0000 mg | Freq: Four times a day (QID) | INTRAMUSCULAR | Status: DC | PRN

## 2017-03-14 MED ORDER — FENTANYL CITRATE (PF) 100 MCG/2ML IJ SOLN
25.0000 ug | INTRAMUSCULAR | Status: DC | PRN
  Administered 2017-03-14: 25 ug via INTRAVENOUS

## 2017-03-14 MED ORDER — MORPHINE SULFATE (PF) 2 MG/ML IV SOLN: 2.0000 mg | INTRAVENOUS | Status: DC | PRN

## 2017-03-14 MED ORDER — ACETAMINOPHEN 10 MG/ML IV SOLN
1000.0000 mg | Freq: Once | INTRAVENOUS | Status: AC
  Administered 2017-03-14: 1000 mg via INTRAVENOUS

## 2017-03-14 MED ORDER — BISACODYL 10 MG RE SUPP
10.0000 mg | Freq: Every day | RECTAL | Status: DC | PRN
  Administered 2017-03-16: 10 mg via RECTAL
  Filled 2017-03-14: qty 1

## 2017-03-14 MED ORDER — FENTANYL CITRATE (PF) 100 MCG/2ML IJ SOLN
INTRAMUSCULAR | Status: AC
  Filled 2017-03-14: qty 2

## 2017-03-14 MED ORDER — OXYCODONE HCL 5 MG PO TABS
5.0000 mg | ORAL_TABLET | ORAL | Status: DC | PRN
  Administered 2017-03-14: 5 mg via ORAL

## 2017-03-14 MED ORDER — OXYCODONE HCL 5 MG PO TABS: 5.0000 mg | ORAL_TABLET | ORAL | Status: DC | PRN

## 2017-03-14 MED ORDER — LACTATED RINGERS IV SOLN
INTRAVENOUS | Status: DC | PRN
  Administered 2017-03-14 (×2): via INTRAVENOUS

## 2017-03-14 MED ORDER — SENNA 8.6 MG PO TABS: 1.0000 | ORAL_TABLET | Freq: Two times a day (BID) | ORAL | Status: DC

## 2017-03-14 MED ORDER — FENTANYL CITRATE (PF) 100 MCG/2ML IJ SOLN
INTRAMUSCULAR | Status: DC | PRN
  Administered 2017-03-14 (×2): 25 ug via INTRAVENOUS
  Administered 2017-03-14: 12.5 ug via INTRAVENOUS
  Administered 2017-03-14: 25 ug via INTRAVENOUS
  Administered 2017-03-14: 50 ug via INTRAVENOUS

## 2017-03-14 MED ORDER — SODIUM CHLORIDE 0.9 % IV SOLN
INTRAVENOUS | Status: DC | PRN
  Administered 2017-03-14: 25 ug/min via INTRAVENOUS

## 2017-03-14 MED ORDER — MIDAZOLAM HCL 2 MG/2ML IJ SOLN
INTRAMUSCULAR | Status: AC
  Filled 2017-03-14: qty 2

## 2017-03-14 MED ORDER — SUGAMMADEX SODIUM 500 MG/5ML IV SOLN
INTRAVENOUS | Status: DC | PRN
  Administered 2017-03-14: 100 mg via INTRAVENOUS

## 2017-03-14 MED ORDER — CLINDAMYCIN PHOSPHATE 300 MG/2ML IJ SOLN
Freq: Once | INTRAMUSCULAR | Status: AC
  Administered 2017-03-14: 600 mL via INTRAVENOUS
  Filled 2017-03-14: qty 4

## 2017-03-14 MED ORDER — MONTELUKAST SODIUM 10 MG PO TABS
10.0000 mg | ORAL_TABLET | Freq: Every day | ORAL | Status: DC
  Administered 2017-03-15 – 2017-03-16 (×2): 10 mg via ORAL
  Filled 2017-03-14 (×2): qty 1

## 2017-03-14 MED ORDER — SODIUM CHLORIDE 0.9 % IV SOLN
INTRAVENOUS | Status: AC | PRN
  Administered 2017-03-14: .2 ug/min via INTRAVENOUS

## 2017-03-14 MED ORDER — FENTANYL CITRATE (PF) 100 MCG/2ML IJ SOLN
INTRAMUSCULAR | Status: AC
  Administered 2017-03-14: 25 ug via INTRAVENOUS
  Filled 2017-03-14: qty 2

## 2017-03-14 MED ORDER — SUGAMMADEX SODIUM 200 MG/2ML IV SOLN
INTRAVENOUS | Status: AC
  Filled 2017-03-14: qty 2

## 2017-03-14 MED ORDER — SENNOSIDES-DOCUSATE SODIUM 8.6-50 MG PO TABS
1.0000 | ORAL_TABLET | Freq: Two times a day (BID) | ORAL | Status: DC
  Administered 2017-03-14 – 2017-03-16 (×4): 1 via ORAL
  Filled 2017-03-14 (×4): qty 1

## 2017-03-14 MED ORDER — ONDANSETRON HCL 4 MG PO TABS: 4.0000 mg | ORAL_TABLET | Freq: Four times a day (QID) | ORAL | Status: DC | PRN

## 2017-03-14 MED ORDER — DONEPEZIL HCL 5 MG PO TABS
5.0000 mg | ORAL_TABLET | Freq: Every day | ORAL | Status: DC
  Administered 2017-03-15: 5 mg via ORAL
  Filled 2017-03-14 (×3): qty 1

## 2017-03-14 MED ORDER — MOMETASONE FURO-FORMOTEROL FUM 200-5 MCG/ACT IN AERO
2.0000 | INHALATION_SPRAY | Freq: Two times a day (BID) | RESPIRATORY_TRACT | Status: DC
Start: 2017-03-14 — End: 2017-03-16
  Administered 2017-03-15 – 2017-03-16 (×3): 2 via RESPIRATORY_TRACT
  Filled 2017-03-14: qty 8.8

## 2017-03-14 MED ORDER — PANTOPRAZOLE SODIUM 40 MG PO TBEC
40.0000 mg | DELAYED_RELEASE_TABLET | Freq: Two times a day (BID) | ORAL | Status: DC
  Administered 2017-03-14 – 2017-03-16 (×4): 40 mg via ORAL
  Filled 2017-03-14 (×4): qty 1

## 2017-03-14 MED ORDER — MORPHINE SULFATE (PF) 2 MG/ML IV SOLN
2.0000 mg | INTRAVENOUS | Status: DC | PRN
  Administered 2017-03-14: 2 mg via INTRAVENOUS
  Filled 2017-03-14: qty 1

## 2017-03-14 MED ORDER — NEOMYCIN-POLYMYXIN B GU 40-200000 IR SOLN
Status: DC | PRN
  Administered 2017-03-14: 16 mL

## 2017-03-14 MED ORDER — ACETAMINOPHEN 10 MG/ML IV SOLN: INTRAVENOUS | Status: DC | PRN

## 2017-03-14 MED ORDER — HYDRALAZINE HCL 20 MG/ML IJ SOLN: 10.0000 mg | Freq: Four times a day (QID) | INTRAMUSCULAR | Status: DC | PRN

## 2017-03-14 MED ORDER — BISACODYL 10 MG RE SUPP: 10.0000 mg | Freq: Every day | RECTAL | Status: DC | PRN

## 2017-03-14 MED ORDER — MENTHOL 3 MG MT LOZG
1.0000 | LOZENGE | OROMUCOSAL | Status: DC | PRN
  Filled 2017-03-14: qty 9

## 2017-03-14 MED ORDER — METOCLOPRAMIDE HCL 5 MG/ML IJ SOLN: 5.0000 mg | Freq: Three times a day (TID) | INTRAMUSCULAR | Status: DC | PRN

## 2017-03-14 MED ORDER — METOPROLOL SUCCINATE 12.5 MG HALF TABLET
12.5000 mg | ORAL_TABLET | Freq: Every day | ORAL | Status: DC
  Filled 2017-03-14: qty 1

## 2017-03-14 MED ORDER — DEXAMETHASONE SODIUM PHOSPHATE 10 MG/ML IJ SOLN
INTRAMUSCULAR | Status: DC | PRN
  Administered 2017-03-14: 4 mg via INTRAVENOUS

## 2017-03-14 MED ORDER — ACETAMINOPHEN 650 MG RE SUPP: 650.0000 mg | Freq: Four times a day (QID) | RECTAL | Status: DC | PRN

## 2017-03-14 MED ORDER — ENOXAPARIN SODIUM 40 MG/0.4ML ~~LOC~~ SOLN
40.0000 mg | SUBCUTANEOUS | Status: DC
  Administered 2017-03-15 – 2017-03-16 (×2): 40 mg via SUBCUTANEOUS
  Filled 2017-03-14 (×2): qty 0.4

## 2017-03-14 MED ORDER — ACETAMINOPHEN 10 MG/ML IV SOLN
1000.0000 mg | Freq: Four times a day (QID) | INTRAVENOUS | Status: AC
  Administered 2017-03-15 (×4): 1000 mg via INTRAVENOUS
  Filled 2017-03-14 (×4): qty 100

## 2017-03-14 SURGICAL SUPPLY — 58 items
BAG DECANTER FOR FLEXI CONT (MISCELLANEOUS) ×2 IMPLANT
BLADE SAW 1 (BLADE) ×2 IMPLANT
CANISTER SUCT 1200ML W/VALVE (MISCELLANEOUS) ×2 IMPLANT
CANISTER SUCT 3000ML PPV (MISCELLANEOUS) ×4 IMPLANT
CAPT HIP HEMI 1 ×2 IMPLANT
CATH FOL LEG HOLDER (MISCELLANEOUS) ×2 IMPLANT
CEMENT HV SMART SET (Cement) ×4 IMPLANT
DRAPE INCISE IOBAN 66X60 STRL (DRAPES) ×2 IMPLANT
DRAPE SHEET LG 3/4 BI-LAMINATE (DRAPES) ×2 IMPLANT
DRAPE TABLE BACK 80X90 (DRAPES) ×2 IMPLANT
DRSG DERMACEA 8X12 NADH (GAUZE/BANDAGES/DRESSINGS) ×2 IMPLANT
DRSG OPSITE POSTOP 4X12 (GAUZE/BANDAGES/DRESSINGS) ×2 IMPLANT
DRSG OPSITE POSTOP 4X14 (GAUZE/BANDAGES/DRESSINGS) ×2 IMPLANT
DRSG TEGADERM 4X4.75 (GAUZE/BANDAGES/DRESSINGS) ×2 IMPLANT
DURAPREP 26ML APPLICATOR (WOUND CARE) ×4 IMPLANT
ELECT BLADE 6.5 EXT (BLADE) ×2 IMPLANT
ELECT CAUTERY BLADE 6.4 (BLADE) ×2 IMPLANT
ELECT REM PT RETURN 9FT ADLT (ELECTROSURGICAL) ×2
ELECTRODE REM PT RTRN 9FT ADLT (ELECTROSURGICAL) ×1 IMPLANT
GAUZE PACK 2X3YD (MISCELLANEOUS) ×2 IMPLANT
GLOVE BIO SURGEON STRL SZ8 (GLOVE) ×2 IMPLANT
GLOVE BIOGEL M STRL SZ7.5 (GLOVE) ×4 IMPLANT
GLOVE BIOGEL PI IND STRL 9 (GLOVE) IMPLANT
GLOVE BIOGEL PI INDICATOR 9 (GLOVE)
GLOVE INDICATOR 8.0 STRL GRN (GLOVE) ×2 IMPLANT
GOWN STRL REUS W/ TWL LRG LVL3 (GOWN DISPOSABLE) ×2 IMPLANT
GOWN STRL REUS W/TWL 2XL LVL3 (GOWN DISPOSABLE) ×2 IMPLANT
GOWN STRL REUS W/TWL LRG LVL3 (GOWN DISPOSABLE) ×2
GRADUATE 1200CC STRL 31836 (MISCELLANEOUS) ×2 IMPLANT
HEMOVAC 400CC 10FR (MISCELLANEOUS) ×2 IMPLANT
HOOD PEEL AWAY FLYTE STAYCOOL (MISCELLANEOUS) ×2 IMPLANT
IV NS 100ML SINGLE PACK (IV SOLUTION) ×2 IMPLANT
KIT RM TURNOVER STRD PROC AR (KITS) ×2 IMPLANT
NDL SAFETY 18GX1.5 (NEEDLE) ×2 IMPLANT
NEEDLE FILTER BLUNT 18X 1/2SAF (NEEDLE) ×1
NEEDLE FILTER BLUNT 18X1 1/2 (NEEDLE) ×1 IMPLANT
NS IRRIG 1000ML POUR BTL (IV SOLUTION) ×2 IMPLANT
PACK HIP PROSTHESIS (MISCELLANEOUS) ×2 IMPLANT
PRESSURIZER CEMENT PROX FEM SM (MISCELLANEOUS) ×2 IMPLANT
PRESSURIZER FEM CANAL M (MISCELLANEOUS) IMPLANT
PULSAVAC PLUS IRRIG FAN TIP (DISPOSABLE) ×2
SOL .9 NS 3000ML IRR  AL (IV SOLUTION) ×1
SOL .9 NS 3000ML IRR UROMATIC (IV SOLUTION) ×1 IMPLANT
SOL PREP PVP 2OZ (MISCELLANEOUS) ×2
SOLUTION PREP PVP 2OZ (MISCELLANEOUS) ×1 IMPLANT
SPONGE DRAIN TRACH 4X4 STRL 2S (GAUZE/BANDAGES/DRESSINGS) ×2 IMPLANT
STAPLER SKIN PROX 35W (STAPLE) ×2 IMPLANT
SUT ETHIBOND #5 BRAIDED 30INL (SUTURE) ×2 IMPLANT
SUT VIC AB 0 CT1 36 (SUTURE) ×2 IMPLANT
SUT VIC AB 1 CT1 36 (SUTURE) ×4 IMPLANT
SUT VIC AB 2-0 CT1 27 (SUTURE) ×1
SUT VIC AB 2-0 CT1 TAPERPNT 27 (SUTURE) ×1 IMPLANT
SYR 20CC LL (SYRINGE) ×2 IMPLANT
SYR TB 1ML LUER SLIP (SYRINGE) ×2 IMPLANT
TAPE TRANSPORE STRL 2 31045 (GAUZE/BANDAGES/DRESSINGS) ×2 IMPLANT
TIP BRUSH PULSAVAC PLUS 24.33 (MISCELLANEOUS) ×2 IMPLANT
TIP FAN IRRIG PULSAVAC PLUS (DISPOSABLE) ×1 IMPLANT
TOWER CARTRIDGE SMART MIX (DISPOSABLE) ×2 IMPLANT

## 2017-03-14 NOTE — ED Triage Notes (Signed)
Patient lost balance this morning, patient states she fell backward.  C/O left hip soreness.  Patient's son states patient has been unable to stand and transfer to commode since fall.  Has had similar falls in the past and has fractured pelvis.

## 2017-03-14 NOTE — Anesthesia Postprocedure Evaluation (Signed)
Anesthesia Post Note  Patient: Theresa Frank  Procedure(s) Performed: Procedure(s) (LRB): ARTHROPLASTY BIPOLAR HIP (HEMIARTHROPLASTY) (Left)  Patient location during evaluation: PACU Anesthesia Type: General Level of consciousness: awake and alert Pain management: pain level controlled Vital Signs Assessment: post-procedure vital signs reviewed and stable Respiratory status: spontaneous breathing, nonlabored ventilation, respiratory function stable and patient connected to nasal cannula oxygen Cardiovascular status: blood pressure returned to baseline and stable Postop Assessment: no signs of nausea or vomiting Anesthetic complications: no     Last Vitals:  Vitals:   03/14/17 2243 03/14/17 2308  BP: 118/74 (!) 103/51  Pulse: 80 79  Resp: 12 18  Temp: (!) 36.3 C 36.5 C    Last Pain:  Vitals:   03/14/17 2308  TempSrc: Oral  PainSc:                  Lenard Simmer

## 2017-03-14 NOTE — ED Notes (Signed)
Spoke with Dr. Ernest Pine and Or nurse regarding NPO and meds. Pt able to take oxy at this time , but no sips hereafter

## 2017-03-14 NOTE — Anesthesia Post-op Follow-up Note (Cosign Needed)
Anesthesia QCDR form completed.        

## 2017-03-14 NOTE — H&P (Signed)
[facility] - [place] at [facility]   PATIENT NAME: Theresa Frank    MR#:  [id number]  DATE OF BIRTH:  Jun 06, 1925  DATE OF ADMISSION:  03/14/2017  PRIMARY CARE PHYSICIAN: Kandyce Rud, MD   REQUESTING/REFERRING PHYSICIAN: Dr. Roxan Hockey  CHIEF COMPLAINT:   Chief Complaint  Patient presents with  . Fall    HISTORY OF PRESENT ILLNESS:  Theresa Frank  is a 83 y.o. female with a known history of Dementia, hypertension, CAD, CVA presents to the emergency room brought in by family after she fell backwards and started complaining of left hip pain. Patient was recently in the hospital over a month back due to left pelvic fractures and discharged to rehabilitation on pain medications. She has had recurrent falls due to gait abnormalities. Did not pass out. Does not complain of any chest pain or shortness of breath. No prior complications with surgeries per family. Today she has an impacted left femoral neck fracture and left symphysis pubis fracture.  PAST MEDICAL HISTORY:   Past Medical History:  Diagnosis Date  . Alzheimer's dementia, late onset, with behavioral disturbance   . Asthma   . Colon polyps   . Diverticulosis   . Hypertension   . MI (myocardial infarction) (HCC)     PAST SURGICAL HISTORY:   Past Surgical History:  Procedure Laterality Date  . ABDOMINAL HYSTERECTOMY    . HERNIA REPAIR      SOCIAL HISTORY:   Social History  Substance Use Topics  . Smoking status: Never Smoker  . Smokeless tobacco: Never Used  . Alcohol use No    FAMILY HISTORY:   Family History  Problem Relation Age of Onset  . Stomach cancer Father     DRUG ALLERGIES:   Allergies  Allergen Reactions  . Ciprofloxacin Other (See Comments)    Reaction: unknown  . Penicillins Other (See Comments)    Reaction: unknown Has patient had a PCN reaction causing immediate rash, facial/tongue/throat swelling, SOB or lightheadedness with hypotension: Unknown Has patient had a PCN  reaction causing severe rash involving mucus membranes or skin necrosis: Unknown Has patient had a PCN reaction that required hospitalization: Unknown Has patient had a PCN reaction occurring within the last 10 years: Unknown If all of the above answers are "NO", then may proceed with Cephalosporin use.   . Premarin [Conjugated Estrogens] Other (See Comments)    Reaction: unknown  . Septra [Sulfamethoxazole-Trimethoprim] Other (See Comments)    Reaction: unknown  . Zocor [Simvastatin] Other (See Comments)    Reaction: unknown    REVIEW OF SYSTEMS:   Review of Systems  Unable to perform ROS: Dementia    MEDICATIONS AT HOME:   Prior to Admission medications   Medication Sig Start Date End Date Taking? Authorizing Provider  aspirin EC 81 MG EC tablet Take 1 tablet (81 mg total) by mouth daily. 01/13/17  Yes Chais Fehringer, Wardell Heath, MD  donepezil (ARICEPT) 5 MG tablet Take 5 mg by mouth at bedtime.   Yes [provider]  Fluticasone-Salmeterol (ADVAIR DISKUS) 250-50 MCG/DOSE AEPB Inhale 1 puff into the lungs 2 (two) times daily.   Yes [provider]  memantine (NAMENDA) 10 MG tablet Take 10 mg by mouth 2 (two) times daily.   Yes [provider]  mirtazapine (REMERON) 15 MG tablet Take 7.5 mg by mouth at bedtime.   Yes [provider]  montelukast (SINGULAIR) 10 MG tablet Take 10 mg by mouth daily.   Yes [provider]  oxyCODONE (OXY IR/ROXICODONE) 5 MG immediate release tablet Take 1 tablet (5 mg total) by mouth every 4 (four) hours as needed for moderate pain. Patient not taking: Reported on 03/14/2017 02/07/17   Milagros Loll, MD  senna-docusate (SENOKOT-S) 8.6-50 MG tablet Take 1 tablet by mouth at bedtime. 02/07/17   Milagros Loll, MD     VITAL SIGNS:  Blood pressure (!) 151/94, pulse 84, temperature 98 F (36.7 C), temperature source Oral, resp. rate 16, height 5\' 6"  (1.676 m), weight 47.2 kg (104 lb), SpO2 98 %.  PHYSICAL EXAMINATION:   Physical Exam  GENERAL:  83 y.o.-year-old patient lying in the bed with no acute distress.  EYES: Pupils equal, round, reactive to light and accommodation. No scleral icterus. Extraocular muscles intact.  HEENT: Head atraumatic, normocephalic. Oropharynx and nasopharynx clear. No oropharyngeal erythema, moist oral mucosa  NECK:  Supple, no jugular venous distention. No thyroid enlargement, no tenderness.  LUNGS: Normal breath sounds bilaterally, no wheezing, rales, rhonchi. No use of accessory muscles of respiration.  CARDIOVASCULAR: S1, S2 normal. No murmurs, rubs, or gallops.  ABDOMEN: Soft, nontender, nondistended. Bowel sounds present. No organomegaly or mass.  EXTREMITIES: No pedal edema, cyanosis, or clubbing. + 2 pedal & radial pulses b/l.   Left hip tenderness NEUROLOGIC: Cranial nerves II through XII are intact. No focal Motor or sensory deficits appreciated b/l PSYCHIATRIC: The patient is alert and awake. SKIN: No obvious rash, lesion, or ulcer.   LABORATORY PANEL:   CBC  Recent Labs Lab 03/14/17 1242  WBC 12.0*  HGB 12.0  HCT 35.3  PLT 208   ------------------------------------------------------------------------------------------------------------------  Chemistries   Recent Labs Lab 03/14/17 1242  NA 138  K 4.2  CL 103  CO2 27  GLUCOSE 109*  BUN 21*  CREATININE 0.82  CALCIUM 9.0   ------------------------------------------------------------------------------------------------------------------  Cardiac Enzymes No results for input(s): TROPONINI in the last 168 hours. ------------------------------------------------------------------------------------------------------------------  RADIOLOGY:  Ct Head Wo Contrast  Result Date: 03/14/2017 CLINICAL DATA:  Fall with head injury. Initial encounter. EXAM: CT HEAD WITHOUT CONTRAST TECHNIQUE: Contiguous axial images were obtained from the base of the skull through the vertex without intravenous contrast.  COMPARISON:  01/10/2017 FINDINGS: Brain: No evidence of acute infarction, hemorrhage, hydrocephalus, extra-axial collection or mass lesion/mass effect. Moderate generalized atrophy. Chronic microvascular ischemic gliosis in the cerebral white matter. Small remote right cerebellar infarct. Remote lacunar infarct in the right putamen. Small cortically based infarcts in the high right frontal lobe and right occipital pole on May 2018 brain MRI are subtle by CT. Vascular: No hyperdense vessel. Atherosclerotic calcification. Vertebrobasilar tortuosity. Skull: Negative for fracture Sinuses/Orbits: No acute finding IMPRESSION: 1. No evidence of intracranial injury. 2. Atrophy and chronic ischemic injury without change since May 2018. Electronically Signed   By: Marnee Spring M.D.   On: 03/14/2017 12:36   Dg Chest Portable 1 View  Result Date: 03/14/2017 CLINICAL DATA:  Patient lost balance this morning, patient states she fell backward. C/O left hip soreness. Patient's son states patient has been unable to stand and transfer to commode since fall. Has had similar falls in the past and has fractured pelvis. Pre op EXAM: PORTABLE CHEST 1 VIEW COMPARISON:  01/10/2017 FINDINGS: Heart is enlarged and stable in configuration. There are no focal consolidations. There is a small left pleural effusion or pleural thickening, stable in appearance. No acute displaced fractures are identified. No pneumothorax. IMPRESSION: No active disease. Electronically Signed   By: Norva Pavlov M.D.   On: 03/14/2017 12:26  Dg Hip Unilat With Pelvis 2-3 Views Left  Result Date: 03/14/2017 CLINICAL DATA:  Patient lost balance this morning, patient states she fell backward. C/O left hip soreness. Patient's son states patient has been unable to stand and transfer to commode since fall. Has had similar falls in the past and has fractured pelvis. EXAM: DG HIP (WITH OR WITHOUT PELVIS) 2-3V LEFT COMPARISON:  None. FINDINGS: There is an acute  fracture of the left femoral neck associated varus angulation and impaction. There is a comminuted fracture of the left symphysis pubis with impaction of the superior and inferior pubic rami, also likely acute. No dislocation. IMPRESSION: 1. Impacted fracture of the left femoral neck. 2. Fracture the left symphysis pubis, likely acute. Electronically Signed   By: Norva Pavlov M.D.   On: 03/14/2017 12:23     IMPRESSION AND PLAN:   * Impacted fracture of the left femoral neck Patient will need surgery. Admit to medical floor. Consult orthopedics. Dr. Ernest Pine informed from emergency room. Patient will be nothing by mouth. DVT prophylaxis with SCDs. Pain medications added. Bedrest. Heparin/Lovenox as per orthopedics after surgery for DVT prophylaxis. Physical therapy after surgery. Incentive spirometer. Patient is low to moderate risk considering her comorbidities and age as long as her EKG does not show any acute findings.. We will monitor.  * Acute fracture the left symphysis pubis She recently had a pelvic fractures. It is unclear if this is worsening or new fracture. We will wait for orthopedic evaluation.  * History of CAD. On aspirin. We'll add low-dose Toprol.  * Dementia. Watch for inpatient delirium.  All the records are reviewed and case discussed with ED provider. Management plans discussed with the patient, family and they are in agreement.  CODE STATUS: DNR  TOTAL TIME TAKING CARE OF THIS PATIENT: 40 minutes.   Milagros Loll R M.D on 03/14/2017 at 1:41 PM  Between 7am to 6pm - Pager - [phone]  After 6pm go to [email or web address] - password [email or web address] [email or web address]  [facility] [place] [facility]  Office  [phone]  CC: Primary care physician; Kandyce Rud, MD  Note: This dictation was prepared with Dragon dictation along with smaller phrase technology. Any transcriptional errors that result from this process are unintentional.

## 2017-03-14 NOTE — Anesthesia Preprocedure Evaluation (Signed)
Anesthesia Evaluation  Patient identified by MRN, date of birth, ID band Patient awake    Reviewed: Allergy & Precautions, H&P , NPO status , Patient's Chart, lab work & pertinent test results, reviewed documented beta blocker date and time   History of Anesthesia Complications Negative for: history of anesthetic complications  Airway Mallampati: I  TM Distance: >3 FB Neck ROM: full    Dental  (+) Edentulous Lower, Edentulous Upper, Dental Advidsory Given   Pulmonary neg shortness of breath, asthma , neg sleep apnea, neg COPD, neg recent URI,           Cardiovascular Exercise Tolerance: Good hypertension, (-) angina+ Past MI  (-) CAD, (-) Cardiac Stents and (-) CABG + dysrhythmias Atrial Fibrillation (-) Valvular Problems/Murmurs     Neuro/Psych neg Seizures PSYCHIATRIC DISORDERS (Alzheimer's) TIA   GI/Hepatic negative GI ROS, Neg liver ROS,   Endo/Other  negative endocrine ROS  Renal/GU negative Renal ROS  negative genitourinary   Musculoskeletal   Abdominal   Peds  Hematology negative hematology ROS (+)   Anesthesia Other Findings Past Medical History: No date: Alzheimer's dementia, late onset, with behavio* No date: Asthma No date: Colon polyps No date: Diverticulosis No date: Hypertension No date: MI (myocardial infarction) (HCC)   Reproductive/Obstetrics negative OB ROS                             Anesthesia Physical Anesthesia Plan  ASA: III  Anesthesia Plan: General, Rapid Sequence and Cricoid Pressure   Post-op Pain Management:    Induction: Rapid sequence, Intravenous and Cricoid pressure planned  PONV Risk Score and Plan: 3 and Ondansetron, Dexamethasone and Treatment may vary due to age or medical condition  Airway Management Planned: Oral ETT  Additional Equipment:   Intra-op Plan:   Post-operative Plan: Extubation in OR  Informed Consent: I have reviewed the  patients History and Physical, chart, labs and discussed the procedure including the risks, benefits and alternatives for the proposed anesthesia with the patient or authorized representative who has indicated his/her understanding and acceptance.   Dental Advisory Given  Plan Discussed with: Anesthesiologist, CRNA and Surgeon  Anesthesia Plan Comments:         Anesthesia Quick Evaluation

## 2017-03-14 NOTE — ED Provider Notes (Signed)
[facility] Emergency Department Provider Note    First MD Initiated Contact with Patient 03/14/17 1142     (approximate)  I have reviewed the triage vital signs and the nursing notes.   HISTORY  Chief Complaint Fall    HPI Theresa Frank is a 83 y.o. female who fell and lost her balance this morning. States she fell and landed on her left hip. Since the fall she's been unable to stand or move. Denies any chest pain or shortness of breath. States that she may have hit her head. She's not on any blood thinners.  Discussed the pain is moderate to severe.   Past Medical History:  Diagnosis Date  . Alzheimer's dementia, late onset, with behavioral disturbance   . Asthma   . Colon polyps   . Diverticulosis   . Hypertension   . MI (myocardial infarction) (HCC)    Family History  Problem Relation Age of Onset  . Stomach cancer Father    Past Surgical History:  Procedure Laterality Date  . ABDOMINAL HYSTERECTOMY    . HERNIA REPAIR     Patient Active Problem List   Diagnosis Date Noted  . Pressure injury of skin 02/06/2017  . Pelvic fracture (HCC) 02/04/2017  . Elevated troponin 02/04/2017  . Fall 01/11/2017  . TIA (transient ischemic attack) 01/10/2017  . HTN (hypertension) 01/10/2017  . Asthma 01/10/2017  . Alzheimer disease 01/10/2017      Prior to Admission medications   Medication Sig Start Date End Date Taking? Authorizing Provider  aspirin EC 81 MG EC tablet Take 1 tablet (81 mg total) by mouth daily. 01/13/17   Milagros Loll, MD  donepezil (ARICEPT) 5 MG tablet Take 5 mg by mouth at bedtime.    [provider]  Fluticasone-Salmeterol (ADVAIR DISKUS) 250-50 MCG/DOSE AEPB Inhale 1 puff into the lungs 2 (two) times daily.    [provider]  memantine (NAMENDA) 10 MG tablet Take 10 mg by mouth 2 (two) times daily.    [provider]  mirtazapine (REMERON) 15 MG tablet Take 7.5 mg by mouth at bedtime.     [provider]  montelukast (SINGULAIR) 10 MG tablet Take 10 mg by mouth daily.    [provider]  oxyCODONE (OXY IR/ROXICODONE) 5 MG immediate release tablet Take 1 tablet (5 mg total) by mouth every 4 (four) hours as needed for moderate pain. 02/07/17   Milagros Loll, MD  senna-docusate (SENOKOT-S) 8.6-50 MG tablet Take 1 tablet by mouth at bedtime. 02/07/17   Milagros Loll, MD    Allergies Ciprofloxacin; Penicillins; Premarin [conjugated estrogens]; Septra [sulfamethoxazole-trimethoprim]; and Zocor [simvastatin]    Social History Social History  Substance Use Topics  . Smoking status: Never Smoker  . Smokeless tobacco: Never Used  . Alcohol use No    Review of Systems Patient denies headaches, rhinorrhea, blurry vision, numbness, shortness of breath, chest pain, edema, cough, abdominal pain, nausea, vomiting, diarrhea, dysuria, fevers, rashes or hallucinations unless otherwise stated above in HPI. ____________________________________________   PHYSICAL EXAM:  VITAL SIGNS: Vitals:   03/14/17 1137  BP: (!) 151/94  Pulse: 84  Resp: 16  Temp: 98 F (36.7 C)    Constitutional: Alert  in no acute distress. Eyes: Conjunctivae are normal.  Head: Atraumatic. Nose: No congestion/rhinnorhea. Mouth/Throat: Mucous membranes are moist.   Neck: No stridor. Painless ROM.  Cardiovascular: Normal rate, regular rhythm. Grossly normal heart sounds.  Good peripheral circulation. Respiratory: Normal respiratory effort.  No retractions.  Lungs CTAB. Gastrointestinal: Soft and nontender. No distention. No abdominal bruits. No CVA tenderness. Musculoskeletal: ttp of left hip.  Pain with log roll.  No obvious deformity Neurologic:  Normal speech and language. No gross focal neurologic deficits are appreciated. No facial droop Skin:  Skin is warm, dry and intact. No rash noted. Psychiatric: Mood and affect are normal. Speech and behavior are  normal.  ____________________________________________   LABS (all labs ordered are listed, but only abnormal results are displayed)  Results for orders placed or performed during the hospital encounter of 03/14/17 (from the past 24 hour(s))  CBC with Differential/Platelet     Status: Abnormal   Collection Time: 03/14/17 12:42 PM  Result Value Ref Range   WBC 12.0 (H) 3.6 - 11.0 K/uL   RBC 3.97 3.80 - 5.20 MIL/uL   Hemoglobin 12.0 12.0 - 16.0 g/dL   HCT 16.1 09.6 - 04.5 %   MCV 88.7 80.0 - 100.0 fL   MCH 30.1 26.0 - 34.0 pg   MCHC 33.9 32.0 - 36.0 g/dL   RDW 40.9 (H) 81.1 - 91.4 %   Platelets 208 150 - 440 K/uL   Neutrophils Relative % 89 %   Neutro Abs 10.8 (H) 1.4 - 6.5 K/uL   Lymphocytes Relative 6 %   Lymphs Abs 0.7 (L) 1.0 - 3.6 K/uL   Monocytes Relative 4 %   Monocytes Absolute 0.5 0.2 - 0.9 K/uL   Eosinophils Relative 0 %   Eosinophils Absolute 0.0 0 - 0.7 K/uL   Basophils Relative 1 %   Basophils Absolute 0.1 0 - 0.1 K/uL  Basic metabolic panel     Status: Abnormal   Collection Time: 03/14/17 12:42 PM  Result Value Ref Range   Sodium 138 135 - 145 mmol/L   Potassium 4.2 3.5 - 5.1 mmol/L   Chloride 103 101 - 111 mmol/L   CO2 27 22 - 32 mmol/L   Glucose, Bld 109 (H) 65 - 99 mg/dL   BUN 21 (H) 6 - 20 mg/dL   Creatinine, Ser 7.82 0.44 - 1.00 mg/dL   Calcium 9.0 8.9 - 95.6 mg/dL   GFR calc non Af Amer >60 >60 mL/min   GFR calc Af Amer >60 >60 mL/min   Anion gap 8 5 - 15  Troponin I     Status: Abnormal   Collection Time: 03/14/17 12:42 PM  Result Value Ref Range   Troponin I 0.04 (HH) <0.03 ng/mL  CK     Status: None   Collection Time: 03/14/17 12:42 PM  Result Value Ref Range   Total CK 49 38 - 234 U/L  Protime-INR     Status: None   Collection Time: 03/14/17  2:57 PM  Result Value Ref Range   Prothrombin Time 14.5 11.4 - 15.2 seconds   INR 1.12    ____________________________________________  EKG My review and personal interpretation at Time: 12:52    Indication: preop  Rate: 85  Rhythm: sinus Axis: normal Other: normal intervals, non specific twave inversions, no STEMI,  ____________________________________________  RADIOLOGY  I personally reviewed all radiographic images ordered to evaluate for the above acute complaints and reviewed radiology reports and findings.  These findings were personally discussed with the patient.  Please see medical record for radiology report.  ____________________________________________   PROCEDURES  Procedure(s) performed:  Procedures    Critical Care performed: no ____________________________________________   INITIAL IMPRESSION / ASSESSMENT AND PLAN / ED COURSE  Pertinent labs & imaging results that were available  during my care of the patient were reviewed by me and considered in my medical decision making (see chart for details).  DDX: fracture, dislocation, contusion  Theresa Frank is a 83 y.o. who presents to the ED with fall from standing as described above. CT imaging shows no evidence of acute traumatic head injury. Patient does have evidence of impacted left Amer neck fracture which is acute. The patient will need admission for further evaluation and management. A spoke with Dr. Ernest Pine orthopedics.  Have discussed with the patient and available family all diagnostics and treatments performed thus far and all questions were answered to the best of my ability. The patient demonstrates understanding and agreement with plan.       ____________________________________________   FINAL CLINICAL IMPRESSION(S) / ED DIAGNOSES  Final diagnoses:  Closed fracture of neck of left femur, initial encounter (HCC)      NEW MEDICATIONS STARTED DURING THIS VISIT:  New Prescriptions   No medications on file     Note:  This document was prepared using Dragon voice recognition software and may include unintentional dictation errors.    Willy Eddy, MD 03/14/17 1626

## 2017-03-14 NOTE — Transfer of Care (Signed)
Immediate Anesthesia Transfer of Care Note  Patient: MERSADIES LEAK  Procedure(s) Performed: Procedure(s): ARTHROPLASTY BIPOLAR HIP (HEMIARTHROPLASTY) (Left)  Patient Location: PACU  Anesthesia Type:General  Level of Consciousness: sedated and patient cooperative  Airway & Oxygen Therapy: Patient Spontanous Breathing and Patient connected to face mask oxygen  Post-op Assessment: Report given to RN and Post -op Vital signs reviewed and stable  Post vital signs: Reviewed and stable  Last Vitals:  Vitals:   03/14/17 1509 03/14/17 2158  BP: 127/83 (!) 144/89  Pulse: 86 83  Resp: 20 15  Temp: 37.1 C (!) 36.2 C    Last Pain:  Vitals:   03/14/17 1509  TempSrc: Oral  PainSc: 1          Complications: No apparent anesthesia complications

## 2017-03-14 NOTE — Progress Notes (Signed)
Patient's family member questioning metoprolol. He states his mom does not take this med at home. MD Sudini notified. Per MD ok not to give. HR in the 80s  Derec Mozingo Cleophus Molt, RN

## 2017-03-14 NOTE — Anesthesia Procedure Notes (Signed)
Procedure Name: Intubation Date/Time: 03/14/2017 6:30 PM Performed by: Lendon Colonel Pre-anesthesia Checklist: Patient identified, Patient being monitored, Timeout performed, Emergency Drugs available and Suction available Patient Re-evaluated:Patient Re-evaluated prior to inductionOxygen Delivery Method: Circle system utilized Preoxygenation: Pre-oxygenation with 100% oxygen Intubation Type: IV induction Ventilation: Mask ventilation without difficulty Laryngoscope Size: Mac and 3 Grade View: Grade I Tube type: Oral Tube size: 7.0 mm Number of attempts: 1 Airway Equipment and Method: Stylet Placement Confirmation: ETT inserted through vocal cords under direct vision,  positive ETCO2 and breath sounds checked- equal and bilateral Secured at: 21 cm Tube secured with: Tape Dental Injury: Teeth and Oropharynx as per pre-operative assessment

## 2017-03-14 NOTE — Progress Notes (Addendum)
Elevated troponin of 0.04. No CP/SOB. Does have chronic elevation. Will review EKG. Unlikely MI.  EKG is unchanged. No CP/SOB.  Not MI

## 2017-03-14 NOTE — Op Note (Signed)
OPERATIVE NOTE  DATE OF SURGERY:  03/14/2017  PATIENT NAME:  Theresa Frank   DOB: 12-Jul-1925  MRN: [id number]  PRE-OPERATIVE DIAGNOSIS: Left femoral neck fracture  POST-OPERATIVE DIAGNOSIS:  Same  PROCEDURE:  Left hip hemiarthroplasty  SURGEON:  Jena Gauss. M.D.  ANESTHESIA: general  ESTIMATED BLOOD LOSS: 750 mL  FLUIDS REPLACED: 1700 mL of crystalloid  DRAINS: 2 medium drains to a Hemovac reservoir  IMPLANTS UTILIZED: DePuy size 5 Summit femoral stem (cemented), 12 mm Cementralizer, 46 mm OD Cathcart hip ball, +0 mm tapered spacer, and a size 4 femoral cement restrictor  INDICATIONS FOR SURGERY: KIDA SABIC is a 83 y.o. year old female who fell and sustained a displaced left femoral neck fracture. After discussion of the risks and benefits of surgical intervention, the patient expressed understanding of the risks benefits and agree with plans for hip hemiarthroplasty.   The risks, benefits, and alternatives were discussed at length including but not limited to the risks of infection, bleeding, nerve injury, stiffness, blood clots, the need for revision surgery, limb length inequality, dislocation, cardiopulmonary complications, among others, and they were willing to proceed.  PROCEDURE IN DETAIL: The patient was brought into the operating room and, after adequate general anesthesia was achieved, patient was placed in a right lateral decubitus position. Axillary roll was placed and all bony prominences were well-padded. The patient's left hip was cleaned and prepped with alcohol and DuraPrep and draped in the usual sterile fashion. A "timeout" was performed as per usual protocol. A lateral curvilinear incision was made gently curving towards the posterior superior iliac spine. The IT band was incised in line with the skin incision and the fibers of the gluteus maximus were split in line. A large hematoma was evacuated from the posterior aspect of the hip. The piriformis tendon  was identified, skeletonized, and incised at its insertion to the proximal femur and reflected posteriorly. A T type posterior capsulotomy was performed. The femoral head was then removed using a corkscrew device. The femoral head was measured using calipers and ring gauges and determined to be 46 mm in diameter.The femoral neck cut was performed using an oscillating saw. The acetabulum was inspected for any bony fragments. The articular surface was in good condition.  Attention was then directed to the proximal femur. A pilot hole for preparation of the proximal femoral canal was created using a high-speed burr. The femoral canal finder was inserted followed by insertion of the conical reamer. Serial broaches were inserted up to a size 5 broach. Calcar region was planed and a trial reduction was performed using a 46 mm OD Cathcart ball with a +0 mm neck length. Good equalization of limb lengths was appreciated and excellent stability was noted both anteriorly and posteriorly. Trial components were removed. The femoral canal was sized and was felt that a size 4 cement restrictor was appropriate. The cement restrictor was inserted to the appropriate depth in the femoral canal was irrigated with copious amounts of fluid using the pulse lavage and suctioned dry. The femoral canal was then packed with vaginal packing soaked in dilute Neo-Synephrine. Polymethylmethacrylate cement was prepared in the usual fashion using a vacuum mixer. Vaginal packing was removed and the canal again irrigated and suctioned dry. The polymethylmethacrylate cement was inserted in retrograde fashion and pressurized. The size 5 Summit femoral component with a 12 mm Cementralizer was positioned and impacted into place. Excess cement was removed using Personal assistant. After adequate curing of the cement, the  Morse taper was cleaned and dried. A 46 mm outer diameter Cathcart hip ball with a +0 mm tapered spacer was placed on the trunnion and  impacted into place. The acetabulum was again irrigated and suctioned dry, making sure to inspect for any residal bony debris. The femoral head was then reduced and placed through a range of motion. Excellent stability was noted both anteriorly and posteriorly. Good equalization of limb lengths was appreciated.   The wound was irrigated with copious amounts of normal saline with antibiotic solution and suctioned dry. Good hemostasis was appreciated. The posterior capsulotomy was repaired using #5 Ethibond. Piriformis tendon was reapproximated to the undersurface of the gluteus medius tendon using #5 Ethibond. Two medium drains were placed in the wound bed and brought out through separate stab incisions to be attached to a Hemovac reservoir. The IT band was reapproximated using interrupted sutures of #1 Vicryl. Subcutaneous tissue was proximal phalanx using first #0 Vicryl followed by #2-0 Vicryl. The skin was closed with skin staples.  The patient tolerated the procedure well and was transported to the recovery room in stable condition.   Jena Gauss., M.D.

## 2017-03-14 NOTE — Consult Note (Signed)
ORTHOPAEDIC CONSULTATION  PATIENT NAME: Theresa Frank DOB: 1924/11/07  MRN: [id number]  REQUESTING PHYSICIAN: Milagros Loll, MD  Chief Complaint: Left hip pain  HPI: Theresa Frank is a 83 y.o. female who apparently fell backwards and landed on her left hip and side. She complained of left hip pain and had apical to a standing or bearing weight on the left lower extremity due to the hip pain. She has a history of frequent falls and was recently admitted to the hospital for pubic rami fractures. There was no apparent loss of consciousness. The patient denies any other injuries.  Past Medical History:  Diagnosis Date  . Alzheimer's dementia, late onset, with behavioral disturbance   . Asthma   . Colon polyps   . Diverticulosis   . Hypertension   . MI (myocardial infarction) [facility])    Past Surgical History:  Procedure Laterality Date  . ABDOMINAL HYSTERECTOMY    . HERNIA REPAIR     Social History   Social History  . Marital status: Married    Spouse name: N/A  . Number of children: N/A  . Years of education: N/A   Social History Main Topics  . Smoking status: Never Smoker  . Smokeless tobacco: Never Used  . Alcohol use No  . Drug use: No  . Sexual activity: Not Asked   Other Topics Concern  . None   Social History Narrative  . None   Family History  Problem Relation Age of Onset  . Stomach cancer Father    Allergies  Allergen Reactions  . Ciprofloxacin Other (See Comments)    Reaction: unknown  . Penicillins Other (See Comments)    Reaction: unknown Has patient had a PCN reaction causing immediate rash, facial/tongue/throat swelling, SOB or lightheadedness with hypotension: Unknown Has patient had a PCN reaction causing severe rash involving mucus membranes or skin necrosis: Unknown Has patient had a PCN reaction that required hospitalization: Unknown Has patient had a PCN reaction occurring within the last 10 years: Unknown If all of the above answers are  "NO", then may proceed with Cephalosporin use.   . Premarin [Conjugated Estrogens] Other (See Comments)    Reaction: unknown  . Septra [Sulfamethoxazole-Trimethoprim] Other (See Comments)    Reaction: unknown  . Zocor [Simvastatin] Other (See Comments)    Reaction: unknown   Prior to Admission medications   Medication Sig Start Date End Date Taking? Authorizing Provider  aspirin EC 81 MG EC tablet Take 1 tablet (81 mg total) by mouth daily. 01/13/17  Yes Sudini, Wardell Heath, MD  donepezil (ARICEPT) 5 MG tablet Take 5 mg by mouth at bedtime.   Yes [provider]  Fluticasone-Salmeterol (ADVAIR DISKUS) 250-50 MCG/DOSE AEPB Inhale 1 puff into the lungs 2 (two) times daily.   Yes [provider]  memantine (NAMENDA) 10 MG tablet Take 10 mg by mouth 2 (two) times daily.   Yes [provider]  mirtazapine (REMERON) 15 MG tablet Take 7.5 mg by mouth at bedtime.   Yes [provider]  montelukast (SINGULAIR) 10 MG tablet Take 10 mg by mouth daily.   Yes [provider]  oxyCODONE (OXY IR/ROXICODONE) 5 MG immediate release tablet Take 1 tablet (5 mg total) by mouth every 4 (four) hours as needed for moderate pain. Patient not taking: Reported on 03/14/2017 02/07/17   Milagros Loll, MD  senna-docusate (SENOKOT-S) 8.6-50 MG tablet Take 1 tablet by mouth at bedtime. 02/07/17   Milagros Loll, MD   Ct Head Wo  Contrast  Result Date: 03/14/2017 CLINICAL DATA:  Fall with head injury. Initial encounter. EXAM: CT HEAD WITHOUT CONTRAST TECHNIQUE: Contiguous axial images were obtained from the base of the skull through the vertex without intravenous contrast. COMPARISON:  01/10/2017 FINDINGS: Brain: No evidence of acute infarction, hemorrhage, hydrocephalus, extra-axial collection or mass lesion/mass effect. Moderate generalized atrophy. Chronic microvascular ischemic gliosis in the cerebral white matter. Small remote right cerebellar infarct. Remote lacunar infarct in the right  putamen. Small cortically based infarcts in the high right frontal lobe and right occipital pole on May 2018 brain MRI are subtle by CT. Vascular: No hyperdense vessel. Atherosclerotic calcification. Vertebrobasilar tortuosity. Skull: Negative for fracture Sinuses/Orbits: No acute finding IMPRESSION: 1. No evidence of intracranial injury. 2. Atrophy and chronic ischemic injury without change since May 2018. Electronically Signed   By: Marnee Spring M.D.   On: 03/14/2017 12:36   Dg Chest Portable 1 View  Result Date: 03/14/2017 CLINICAL DATA:  Patient lost balance this morning, patient states she fell backward. C/O left hip soreness. Patient's son states patient has been unable to stand and transfer to commode since fall. Has had similar falls in the past and has fractured pelvis. Pre op EXAM: PORTABLE CHEST 1 VIEW COMPARISON:  01/10/2017 FINDINGS: Heart is enlarged and stable in configuration. There are no focal consolidations. There is a small left pleural effusion or pleural thickening, stable in appearance. No acute displaced fractures are identified. No pneumothorax. IMPRESSION: No active disease. Electronically Signed   By: Norva Pavlov M.D.   On: 03/14/2017 12:26   Dg Hip Unilat With Pelvis 2-3 Views Left  Result Date: 03/14/2017 CLINICAL DATA:  Patient lost balance this morning, patient states she fell backward. C/O left hip soreness. Patient's son states patient has been unable to stand and transfer to commode since fall. Has had similar falls in the past and has fractured pelvis. EXAM: DG HIP (WITH OR WITHOUT PELVIS) 2-3V LEFT COMPARISON:  None. FINDINGS: There is an acute fracture of the left femoral neck associated varus angulation and impaction. There is a comminuted fracture of the left symphysis pubis with impaction of the superior and inferior pubic rami, also likely acute. No dislocation. IMPRESSION: 1. Impacted fracture of the left femoral neck. 2. Fracture the left symphysis pubis,  likely acute. Electronically Signed   By: Norva Pavlov M.D.   On: 03/14/2017 12:23    Positive ROS: All other systems have been reviewed and were otherwise negative with the exception of those mentioned in the HPI and as above.  Physical Exam: General: Well developed, well nourished female seen in no acute distress. HEENT: Atraumatic and normocephalic. Sclera are clear. Extraocular motion is intact. Oropharynx is clear with moist mucosa. Neck: Supple, nontender, good range of motion. No JVD or carotid bruits. Lungs: Clear to auscultation bilaterally. Cardiovascular: Regular rate and rhythm with normal S1 and S2. No murmurs. No gallops or rubs. Pedal pulses are palpable bilaterally. Homans test is negative bilaterally. No significant pretibial or ankle edema. Abdomen: Soft, nontender, and nondistended. Bowel sounds are present. Skin: No lesions in the area of chief complaint Neurologic: Awake, alert, and oriented. Sensory function is grossly intact. Motor strength is felt to be 5 over 5 bilaterally. No clonus or tremor. Good motor coordination. Lymphatic: No axillary or cervical lymphadenopathy  MUSCULOSKELETAL: Examination of the left lower extremity shows the extremity to be shortened and rotated. Pain is less so with attempts at active or passive range of motion of the hip.No knee effusion.  No tenderness to the knee or ankle  Radiographs: I reviewed regress of the pelvis and left hip obtained earlier today. There is a displaced left femoral neck fracture. Superior and inferior pubic rami fractures are also appreciated on the left.  Assessment: Left femoral neck fracture  Plan: The findings were discussed in detail with the patient and her family. Recommendation was made for left hip hemiarthroplasty. The usual perioperative course was discussed. The risks and benefits of surgical intervention were reviewed. The patient and her family expressed understanding of the risks and benefits  and agreed with plans for surgical intervention.   Pierce Biagini P. Angie Fava M.D.

## 2017-03-15 ENCOUNTER — Encounter: Payer: Self-pay | Admitting: Orthopedic Surgery

## 2017-03-15 LAB — CBC
HEMATOCRIT: 26.8 % — AB (ref 35.0–47.0)
HEMOGLOBIN: 9.1 g/dL — AB (ref 12.0–16.0)
MCH: 30.1 pg (ref 26.0–34.0)
MCHC: 34.1 g/dL (ref 32.0–36.0)
MCV: 88.4 fL (ref 80.0–100.0)
Platelets: 198 10*3/uL (ref 150–440)
RBC: 3.03 MIL/uL — ABNORMAL LOW (ref 3.80–5.20)
RDW: 17.3 % — ABNORMAL HIGH (ref 11.5–14.5)
WBC: 8.9 10*3/uL (ref 3.6–11.0)

## 2017-03-15 LAB — HEMOGLOBIN AND HEMATOCRIT, BLOOD
HCT: 23.4 % — ABNORMAL LOW (ref 35.0–47.0)
HEMATOCRIT: 25 % — AB (ref 35.0–47.0)
HEMOGLOBIN: 8 g/dL — AB (ref 12.0–16.0)
Hemoglobin: 8.8 g/dL — ABNORMAL LOW (ref 12.0–16.0)

## 2017-03-15 LAB — BASIC METABOLIC PANEL
Anion gap: 4 — ABNORMAL LOW (ref 5–15)
BUN: 22 mg/dL — ABNORMAL HIGH (ref 6–20)
CHLORIDE: 109 mmol/L (ref 101–111)
CO2: 27 mmol/L (ref 22–32)
CREATININE: 0.9 mg/dL (ref 0.44–1.00)
Calcium: 8 mg/dL — ABNORMAL LOW (ref 8.9–10.3)
GFR calc non Af Amer: 55 mL/min — ABNORMAL LOW (ref >60)
GLUCOSE: 133 mg/dL — AB (ref 65–99)
Potassium: 4.1 mmol/L (ref 3.5–5.1)
Sodium: 140 mmol/L (ref 135–145)

## 2017-03-15 LAB — ABO/RH: ABO/RH(D): A POS

## 2017-03-15 LAB — PREPARE RBC (CROSSMATCH)

## 2017-03-15 MED ORDER — DEXTROSE 5 % IV SOLN
600.0000 mg | Freq: Once | INTRAVENOUS | Status: AC
  Administered 2017-03-15: 600 mg via INTRAVENOUS
  Filled 2017-03-15: qty 4

## 2017-03-15 MED ORDER — ENSURE ENLIVE PO LIQD
237.0000 mL | Freq: Two times a day (BID) | ORAL | Status: DC
  Administered 2017-03-16: 237 mL via ORAL

## 2017-03-15 MED ORDER — SODIUM CHLORIDE 0.9 % IV SOLN
Freq: Once | INTRAVENOUS | Status: AC
  Administered 2017-03-15: 11:00:00 via INTRAVENOUS

## 2017-03-15 MED ORDER — SODIUM CHLORIDE 0.9 % IV BOLUS (SEPSIS)
1000.0000 mL | Freq: Once | INTRAVENOUS | Status: AC
  Administered 2017-03-15: 1000 mL via INTRAVENOUS

## 2017-03-15 MED ORDER — SODIUM CHLORIDE 0.9 % IV SOLN: Freq: Once | INTRAVENOUS | Status: DC

## 2017-03-15 MED ORDER — SODIUM CHLORIDE 0.9 % IV BOLUS (SEPSIS)
500.0000 mL | Freq: Once | INTRAVENOUS | Status: AC
  Administered 2017-03-15: 500 mL via INTRAVENOUS

## 2017-03-15 NOTE — Progress Notes (Signed)
Patient's BP continues to be low 92/54, HR 75. Pt asymptomatic. Urine output of 225 mls. MD Luberta Mutter made aware. Per MD, continue to monitor BP.    Theresa Peters, RN

## 2017-03-15 NOTE — Progress Notes (Signed)
Patient BP 84/58. MD notified, Bolus NS ordered. Nursing continues to monitor.

## 2017-03-15 NOTE — Evaluation (Signed)
Clinical/Bedside Swallow Evaluation Patient Details  Name: Theresa Frank MRN: [id number] Date of Birth: 1924/10/27  Today's Date: 03/15/2017 Time: SLP Start Time (ACUTE ONLY): 0930 SLP Stop Time (ACUTE ONLY): 1030 SLP Time Calculation (min) (ACUTE ONLY): 60 min  Past Medical History:  Past Medical History:  Diagnosis Date  . Alzheimer's dementia, late onset, with behavioral disturbance   . Asthma   . Colon polyps   . Diverticulosis   . Hypertension   . MI (myocardial infarction) [facility])    Past Surgical History:  Past Surgical History:  Procedure Laterality Date  . ABDOMINAL HYSTERECTOMY    . HERNIA REPAIR    . HIP ARTHROPLASTY Left 03/14/2017   Procedure: ARTHROPLASTY BIPOLAR HIP (HEMIARTHROPLASTY);  Surgeon: Donato Heinz, MD;  Location: [facility] ORS;  Service: Orthopedics;  Laterality: Left;   HPI:  Pt is a 83 y.o. female with a known history of Dementia, hypertension, CAD, CVA presents to the emergency room brought in by family after she fell backwards and started complaining of left hip pain. Patient was recently in the hospital over a month back due to left pelvic fractures and discharged to rehabilitation on pain medications. She has had recurrent falls due to gait abnormalities. Did not pass out. Does not complain of any chest pain or shortness of breath. No prior complications with surgeries per family. Per MD, she has an impacted left femoral neck fracture and left symphysis pubis fracture.   Assessment / Plan / Recommendation Clinical Impression  Pt appears to present w/ adequate oropharyngeal phase swallow function w/ no gross oropharyngeal phase deficits noted during po trials. Pt does wear O2 support and does have baseline Cognitive deficits - Dementia. She consumed several trials of thin liquids and soft solids w/ setup assist and min verbal cues to redirect to task when needed - oral phase wfl for bolus management; no overt s/s of aspiration noted. Pt appears at reduced risk  for aspiration and dysphagia when following general aspiration precautions w/ the dysphagia level 3 diet, thin liquids. Recommend Pills in puree for easier swallowing as needed; tray setup at meals. No further skilled ST Services indicated at this time as pt appears at her baseline w/ swallowing; NSG to reconsult if any change in status while admitted.  SLP Visit Diagnosis: Dysphagia, oropharyngeal phase (R13.12)    Aspiration Risk   (reduced following general aspiration precautions)    Diet Recommendation  Dysphagia level 3 (mech soft) w/ thin liquids;  General aspiration precautions;  Tray setup at meals.   Medication Administration: Whole meds with puree (for easier swallowing and clearing as needed)    Other  Recommendations Recommended Consults:  (Dietician f/u) Oral Care Recommendations: Oral care BID;Patient independent with oral care;Staff/trained caregiver to provide oral care   Follow up Recommendations None      Frequency and Duration            Prognosis Prognosis for Safe Diet Advancement: Good Barriers to Reach Goals: Cognitive deficits;Severity of deficits      Swallow Study   General Date of Onset: 03/14/17 HPI: Pt is a 83 y.o. female with a known history of Dementia, hypertension, CAD, CVA presents to the emergency room brought in by family after she fell backwards and started complaining of left hip pain. Patient was recently in the hospital over a month back due to left pelvic fractures and discharged to rehabilitation on pain medications. She has had recurrent falls due to gait abnormalities. Did not pass out. Does not  complain of any chest pain or shortness of breath. No prior complications with surgeries per family. Per MD, she has an impacted left femoral neck fracture and left symphysis pubis fracture. Type of Study: Bedside Swallow Evaluation Previous Swallow Assessment: none noted Diet Prior to this Study: Regular;Thin liquids ("softer foods") Temperature  Spikes Noted: No (wbc 8.9) Respiratory Status: Nasal cannula (8 liters) History of Recent Intubation: No Behavior/Cognition: Alert;Cooperative;Pleasant mood;Confused;Distractible;Requires cueing (baseline Dementia) Oral Cavity Assessment: Within Functional Limits Oral Care Completed by SLP: Recent completion by staff Oral Cavity - Dentition:  (dentures) Vision: Functional for self-feeding Self-Feeding Abilities: Able to feed self;Needs assist;Needs set up Patient Positioning: Upright in chair Baseline Vocal Quality: Normal Volitional Cough: Strong Volitional Swallow: Able to elicit    Oral/Motor/Sensory Function Overall Oral Motor/Sensory Function: Within functional limits   Ice Chips Ice chips: Not tested   Thin Liquid Thin Liquid: Within functional limits Presentation: Cup;Self Fed;Straw (5-6 sips via each)    Nectar Thick Nectar Thick Liquid: Not tested   Honey Thick Honey Thick Liquid: Not tested   Puree Puree: Within functional limits Presentation: Spoon (fed; 3 trials)   Solid   GO   Solid: Within functional limits (mech soft consistency) Presentation: Self Fed (4 trials) Other Comments: Son stated pt did not have any trouble swallowing         Jerilynn Som, MS, CCC-SLP Tija Biss 03/15/2017,3:16 PM

## 2017-03-15 NOTE — Progress Notes (Signed)
[facility] -  at [facility]   PATIENT NAME: Theresa Frank    MR#:  [id number]  DATE OF BIRTH:  1925-04-17  SUBJECTIVE: Admitted for left hip fracture, status post repair. Blood pressure is low this morning but she  Is  not symptomatic.   CHIEF COMPLAINT:   Chief Complaint  Patient presents with  . Fall    REVIEW OF SYSTEMS:   ROS CONSTITUTIONAL: No fever, fatigue or weakness.  EYES: No blurred or double vision.  EARS, NOSE, AND THROAT: No tinnitus or ear pain.  RESPIRATORY: No cough, shortness of breath, wheezing or hemoptysis.  CARDIOVASCULAR: No chest pain, orthopnea, edema.  GASTROINTESTINAL: No nausea, vomiting, diarrhea or abdominal pain.  GENITOURINARY: No dysuria, hematuria.  ENDOCRINE: No polyuria, nocturia,  HEMATOLOGY: No anemia, easy bruising or bleeding SKIN: No rash or lesion. MUSCULOSKELETAL: Mild left hip pain but overall better.  NEUROLOGIC: No tingling, numbness, weakness.  PSYCHIATRY: No anxiety or depression.   DRUG ALLERGIES:   Allergies  Allergen Reactions  . Ciprofloxacin Other (See Comments)    Reaction: unknown  . Penicillins Other (See Comments)    Reaction: unknown Has patient had a PCN reaction causing immediate rash, facial/tongue/throat swelling, SOB or lightheadedness with hypotension: Unknown Has patient had a PCN reaction causing severe rash involving mucus membranes or skin necrosis: Unknown Has patient had a PCN reaction that required hospitalization: Unknown Has patient had a PCN reaction occurring within the last 10 years: Unknown If all of the above answers are "NO", then may proceed with Cephalosporin use.   . Premarin [Conjugated Estrogens] Other (See Comments)    Reaction: unknown  . Septra [Sulfamethoxazole-Trimethoprim] Other (See Comments)    Reaction: unknown  . Zocor [Simvastatin] Other (See Comments)    Reaction: unknown    VITALS:  Blood pressure (!) 89/72, pulse 75, temperature 98.6 F  (37 C), temperature source Oral, resp. rate 20, height 5\' 6"  (1.676 m), weight 52.2 kg (115 lb), SpO2 99 %.  PHYSICAL EXAMINATION:  GENERAL:  83 y.o.-year-old patient lying in the bed with no acute distress.  EYES: Pupils equal, round, reactive to light and accommodation. No scleral icterus. Extraocular muscles intact.  HEENT: Head atraumatic, normocephalic. Oropharynx and nasopharynx clear.  NECK:  Supple, no jugular venous distention. No thyroid enlargement, no tenderness.  LUNGS: Normal breath sounds bilaterally, no wheezing, rales,rhonchi or crepitation. No use of accessory muscles of respiration.  CARDIOVASCULAR: S1, S2 normal. No murmurs, rubs, or gallops.  ABDOMEN: Soft, nontender, nondistended. Bowel sounds present. No organomegaly or mass.  EXTREMITIES: No pedal edema, cyanosis, or clubbing.  NEUROLOGIC: Cranial nerves II through XII are intact. Muscle strength 5/5 in all extremities. Sensation intact. Gait not checked.  PSYCHIATRIC: The patient is alert and oriented x 3.  SKIN: No obvious rash, lesion, or ulcer.    LABORATORY PANEL:   CBC  Recent Labs Lab 03/15/17 0350  WBC 8.9  HGB 9.1*  HCT 26.8*  PLT 198   ------------------------------------------------------------------------------------------------------------------  Chemistries   Recent Labs Lab 03/15/17 0350  NA 140  K 4.1  CL 109  CO2 27  GLUCOSE 133*  BUN 22*  CREATININE 0.90  CALCIUM 8.0*   ------------------------------------------------------------------------------------------------------------------  Cardiac Enzymes  Recent Labs Lab 03/14/17 1242  TROPONINI 0.04*   ------------------------------------------------------------------------------------------------------------------  RADIOLOGY:  Ct Head Wo Contrast  Result Date: 03/14/2017 CLINICAL DATA:  Fall with head injury. Initial encounter. EXAM: CT HEAD WITHOUT CONTRAST TECHNIQUE: Contiguous axial images were obtained from the base  of the  skull through the vertex without intravenous contrast. COMPARISON:  01/10/2017 FINDINGS: Brain: No evidence of acute infarction, hemorrhage, hydrocephalus, extra-axial collection or mass lesion/mass effect. Moderate generalized atrophy. Chronic microvascular ischemic gliosis in the cerebral white matter. Small remote right cerebellar infarct. Remote lacunar infarct in the right putamen. Small cortically based infarcts in the high right frontal lobe and right occipital pole on May 2018 brain MRI are subtle by CT. Vascular: No hyperdense vessel. Atherosclerotic calcification. Vertebrobasilar tortuosity. Skull: Negative for fracture Sinuses/Orbits: No acute finding IMPRESSION: 1. No evidence of intracranial injury. 2. Atrophy and chronic ischemic injury without change since May 2018. Electronically Signed   By: Marnee Spring M.D.   On: 03/14/2017 12:36   Dg Chest Portable 1 View  Result Date: 03/14/2017 CLINICAL DATA:  Patient lost balance this morning, patient states she fell backward. C/O left hip soreness. Patient's son states patient has been unable to stand and transfer to commode since fall. Has had similar falls in the past and has fractured pelvis. Pre op EXAM: PORTABLE CHEST 1 VIEW COMPARISON:  01/10/2017 FINDINGS: Heart is enlarged and stable in configuration. There are no focal consolidations. There is a small left pleural effusion or pleural thickening, stable in appearance. No acute displaced fractures are identified. No pneumothorax. IMPRESSION: No active disease. Electronically Signed   By: Norva Pavlov M.D.   On: 03/14/2017 12:26   Dg Hip Port Unilat With Pelvis 1v Left  Result Date: 03/14/2017 CLINICAL DATA:  83 y/o  F; status post left hip arthroplasty. EXAM: DG HIP (WITH OR WITHOUT PELVIS) 1V PORT LEFT COMPARISON:  None. FINDINGS: Left hip hemiarthroplasty is well seated. No periprosthetic fracture identified. Postsurgical changes with apparent fluid in the surrounding soft  tissues, skin staples, and a drain. Stable fracture of the left symphysis pubis. Mesh anchors project over the pelvis. Mild osteoarthrosis of the right hip. IMPRESSION: 1. Left hip hemiarthroplasty with expected postsurgical changes. 2. Stable acute fracture of the left symphysis pubis. Electronically Signed   By: Mitzi Hansen M.D.   On: 03/14/2017 22:49   Dg Hip Unilat With Pelvis 2-3 Views Left  Result Date: 03/14/2017 CLINICAL DATA:  Patient lost balance this morning, patient states she fell backward. C/O left hip soreness. Patient's son states patient has been unable to stand and transfer to commode since fall. Has had similar falls in the past and has fractured pelvis. EXAM: DG HIP (WITH OR WITHOUT PELVIS) 2-3V LEFT COMPARISON:  None. FINDINGS: There is an acute fracture of the left femoral neck associated varus angulation and impaction. There is a comminuted fracture of the left symphysis pubis with impaction of the superior and inferior pubic rami, also likely acute. No dislocation. IMPRESSION: 1. Impacted fracture of the left femoral neck. 2. Fracture the left symphysis pubis, likely acute. Electronically Signed   By: Norva Pavlov M.D.   On: 03/14/2017 12:23    EKG:   Orders placed or performed during the hospital encounter of 03/14/17  . EKG 12-Lead  . EKG 12-Lead    ASSESSMENT AND PLAN:  Left hip fracture: Status post repair, ambulating good with physical therapy. Disposition to skilled nursing and arrangements are made. Continue DVT prophylaxis. #2. Hypotension this morning. But patient not symptomatic, no orthostatic hypotension. Hemoglobin dropped from 12-9.1 today. Transfuse 1 unit of packed RBC to see if it helps with blood pressure. Hold morphine due to hypotension. Also received 2 doses of normal saline fluid bolus.  #3 dementia. Continue home medicine. All the records are  reviewed and case discussed with Care Management/Social Workerr. Management plans discussed  with the patient, family and they are in agreement.  CODE STATUS: DNR  TOTAL TIME TAKING CARE OF THIS PATIENT: 35 minutes.   POSSIBLE D/C IN 1-2DAYS, DEPENDING ON CLINICAL CONDITION.   Katha Hamming M.D on 03/15/2017 at 9:30 AM  Between 7am to 6pm - Pager - [phone]  After 6pm go to [email or web address] - password [email or web address] [facility]  [facility]  Office  [phone]  CC: Primary care physician; Kandyce Rud, MD   Note: This dictation was prepared with Dragon dictation along with smaller phrase technology. Any transcriptional errors that result from this process are unintentional.

## 2017-03-15 NOTE — [place] FL2 (Signed)
[place] MEDICAID FL2 LEVEL OF CARE SCREENING TOOL     IDENTIFICATION  Patient Name: Theresa Frank Birthdate: 18-Sep-1924 Sex: female Admission Date (Current Location): 03/14/2017  [place] and [place] Number:  Chiropodist and Address:  [facility], [address], [place], [place] [address]      Provider Number: [id number]  Attending Physician Name and Address:  Katha Hamming, MD  Relative Name and Phone Number:       Current Level of Care: Hospital Recommended Level of Care: Skilled Nursing Facility Prior Approval Number:    Date Approved/Denied:   PASRR Number:  [id number])  Discharge Plan: SNF    Current Diagnoses: Patient Active Problem List   Diagnosis Date Noted  . Left displaced femoral neck fracture (HCC) 03/14/2017  . MI (myocardial infarction) (HCC)   . Pressure injury of skin 02/06/2017  . Pelvic fracture (HCC) 02/04/2017  . Elevated troponin 02/04/2017  . Fall 01/11/2017  . TIA (transient ischemic attack) 01/10/2017  . HTN (hypertension) 01/10/2017  . Asthma 01/10/2017  . Alzheimer disease 01/10/2017    Orientation RESPIRATION BLADDER Height & Weight     Self, Place  Normal Continent Weight: 115 lb (52.2 kg) Height:  5\' 6"  (167.6 cm)  BEHAVIORAL SYMPTOMS/MOOD NEUROLOGICAL BOWEL NUTRITION STATUS   (none)  (none) Continent Diet (Diet: Clear Liquid to be advanced. )  AMBULATORY STATUS COMMUNICATION OF NEEDS Skin   Extensive Assist Verbally Surgical wounds (Incision: Left Hip. )                       Personal Care Assistance Level of Assistance  Bathing, Feeding, Dressing Bathing Assistance: Limited assistance Feeding assistance: Independent Dressing Assistance: Limited assistance     Functional Limitations Info  Sight, Hearing, Speech Sight Info: Adequate Hearing Info: Adequate Speech Info: Adequate    SPECIAL CARE FACTORS FREQUENCY  PT (By licensed PT), OT (By licensed OT)     PT  Frequency:  (5) OT Frequency:  (5)            Contractures      Additional Factors Info  Code Status, Allergies Code Status Info:  (DNR ) Allergies Info:  (Ciprofloxacin, Penicillins, Premarin Conjugated Estrogens, Septra Sulfamethoxazole-trimethoprim, Zocor Simvastatin)           Current Medications (03/15/2017):  This is the current hospital active medication list Current Facility-Administered Medications  Medication Dose Route Frequency Provider Last Rate Last Dose  . 0.9 %  sodium chloride infusion   Intravenous Continuous Hooten, Illene Labrador, MD 100 mL/hr at 03/15/17 9470    . acetaminophen (OFIRMEV) IV 1,000 mg  1,000 mg Intravenous Q6H Hooten, Illene Labrador, MD   Stopped at 03/15/17 [phone]  . acetaminophen (TYLENOL) tablet 650 mg  650 mg Oral Q6H PRN Hooten, Illene Labrador, MD       Or  . acetaminophen (TYLENOL) suppository 650 mg  650 mg Rectal Q6H PRN Hooten, Illene Labrador, MD      . albuterol (PROVENTIL) (2.5 MG/3ML) 0.083% nebulizer solution 2.5 mg  2.5 mg Nebulization Q2H PRN Sudini, Wardell Heath, MD      . bisacodyl (DULCOLAX) suppository 10 mg  10 mg Rectal Daily PRN Hooten, Illene Labrador, MD      . donepezil (ARICEPT) tablet 5 mg  5 mg Oral QHS Sudini, Srikar, MD      . enoxaparin (LOVENOX) injection 40 mg  40 mg Subcutaneous Q24H Hooten, Illene Labrador, MD      . ferrous  sulfate tablet 325 mg  325 mg Oral BID WC Hooten, Illene Labrador, MD      . hydrALAZINE (APRESOLINE) injection 10 mg  10 mg Intravenous Q6H PRN Sudini, Srikar, MD      . magnesium hydroxide (MILK OF MAGNESIA) suspension 30 mL  30 mL Oral Daily PRN Hooten, Illene Labrador, MD      . memantine (NAMENDA) tablet 10 mg  10 mg Oral BID Sudini, Srikar, MD      . menthol-cetylpyridinium (CEPACOL) lozenge 3 mg  1 lozenge Oral PRN Hooten, Illene Labrador, MD       Or  . phenol (CHLORASEPTIC) mouth spray 1 spray  1 spray Mouth/Throat PRN Hooten, Illene Labrador, MD      . metoCLOPramide (REGLAN) tablet 5-10 mg  5-10 mg Oral Q8H PRN Hooten, Illene Labrador, MD       Or  . metoCLOPramide  (REGLAN) injection 5-10 mg  5-10 mg Intravenous Q8H PRN Hooten, Illene Labrador, MD      . metoCLOPramide (REGLAN) tablet 10 mg  10 mg Oral TID AC & HS Hooten, Illene Labrador, MD      . mirtazapine (REMERON) tablet 7.5 mg  7.5 mg Oral QHS Sudini, Srikar, MD      . mometasone-formoterol (DULERA) 200-5 MCG/ACT inhaler 2 puff  2 puff Inhalation BID Sudini, Srikar, MD      . montelukast (SINGULAIR) tablet 10 mg  10 mg Oral Daily Sudini, Srikar, MD      . morphine 2 MG/ML injection 2-4 mg  2-4 mg Intravenous Q4H PRN Hooten, Illene Labrador, MD      . ondansetron (ZOFRAN) tablet 4 mg  4 mg Oral Q6H PRN Hooten, Illene Labrador, MD       Or  . ondansetron (ZOFRAN) injection 4 mg  4 mg Intravenous Q6H PRN Hooten, Illene Labrador, MD      . oxyCODONE (Oxy IR/ROXICODONE) immediate release tablet 5-10 mg  5-10 mg Oral Q4H PRN Hooten, Illene Labrador, MD      . pantoprazole (PROTONIX) EC tablet 40 mg  40 mg Oral BID Donato Heinz, MD   40 mg at 03/14/17 2348  . senna-docusate (Senokot-S) tablet 1 tablet  1 tablet Oral BID Donato Heinz, MD   1 tablet at 03/14/17 2348  . sodium phosphate (FLEET) 7-19 GM/118ML enema 1 enema  1 enema Rectal Once PRN Hooten, Illene Labrador, MD      . traMADol Janean Sark) tablet 50-100 mg  50-100 mg Oral Q4H PRN Hooten, Illene Labrador, MD         Discharge Medications: Please see discharge summary for a list of discharge medications.  Relevant Imaging Results:  Relevant Lab Results:   Additional Information  (SSN: [id number])  Arlett Goold, Darleen Crocker, LCSW

## 2017-03-15 NOTE — Progress Notes (Signed)
PT Cancellation Note  Patient Details Name: Theresa Frank MRN: [id number] DOB: 1925/05/15   Cancelled Treatment:    Reason Eval/Treat Not Completed: Other (comment)   Attempted x 2 this PM.  Pt receiving blood transfusion.  Will continue as appropriate.    Danielle Dess 03/15/2017, 2:07 PM

## 2017-03-15 NOTE — Progress Notes (Signed)
Initial Nutrition Assessment  DOCUMENTATION CODES:   Severe malnutrition in context of chronic illness  INTERVENTION:  Provide Ensure Enlive po BID, each supplement provides 350 kcal and 20 grams of protein.  Encouraged adequate intake of calories and protein. Discussed her increased nutrient needs for adequate healing. Encouraged ongoing intake of oral nutrition supplement at home.   NUTRITION DIAGNOSIS:   Malnutrition (Severe) related to chronic illness (advanced age, dementia) as evidenced by severe depletion of body fat, severe depletion of muscle mass.  GOAL:   Patient will meet greater than or equal to 90% of their needs  MONITOR:   PO intake, Supplement acceptance, Labs, Weight trends, I & O's  REASON FOR ASSESSMENT:   Other (Comment) (Low BMI)    ASSESSMENT:   83 year old female with PMHx of HTN, asthma, hx of MI, diverticulosis, Alzheimer's dementia who presented after a fall found to have left femoral neck fracture now s/p left hip hemiarthroplasty on 7/9.   Spoke with patient at bedside. She did not seem to be a very accurate historian. Patient reports she has a good appetite and she eats three meals per day. For breakfast she likes to have cereal with milk. For lunch she has a sandwich. She was not able to report what she has for dinner. She reports she lives at home with her husband and prepares meals, but that she has assistance occasionally. [name] came into room during assessment and reports that patient may eat three meals per day, but they are likely very small portions. She reports she does enjoy Ensure and is amenable to drinking two bottles daily.  Patient was unsure of her weight history. Per chart patient was 123 lbs at an appointment on 03/02/2016, so she has lost 8 lbs (6.5% body weight) over the past year, which is not significant for time frame.  Meal Completion: 50% of lunch today per chart (383 kcal and 10 grams of protein)  Medications  reviewed and include: ferrous sulfate 325 mg BID, Reglan 10 mg TID before meals and QHS, pantoprazole, senna.   Labs reviewed: BUN 22.   Nutrition-Focused physical exam completed. Findings are severe fat depletion, severe muscle depletion, and no edema.   Discussed with RN.  Diet Order:  Diet regular Room service appropriate? Yes; Fluid consistency: Thin  Skin:  Wound (see comment) (closed incision left hip)  Last BM:  PTA (03/13/2017 per chart)  Height:   Ht Readings from Last 1 Encounters:  03/14/17 5\' 6"  (1.676 m)    Weight:   Wt Readings from Last 1 Encounters:  03/15/17 115 lb (52.2 kg)    Ideal Body Weight:  59.1 kg  BMI:  Body mass index is 18.56 kg/m.  Estimated Nutritional Needs:   Kcal:  1670-1830 (30-35 kcal/kg)  Protein:  78-88 grams (1.5-1.7 grams/kg)  Fluid:  1.3 L/day (25 ml/kg)  EDUCATION NEEDS:   Education needs addressed  Helane Rima, MS, RD, LDN Pager: [phone] After Hours Pager: [phone]

## 2017-03-15 NOTE — Progress Notes (Signed)
Dentures in container at patient's bedside. Report given to [place], Charity fundraiser.

## 2017-03-15 NOTE — Care Management (Signed)
RNCM consult for discharge planning. S/P left hip Heimarthroplasty. PT recommending SNF. Will sign off at this time. Please reconsult if needs arise.

## 2017-03-15 NOTE — Progress Notes (Signed)
Pt's BP 89/48. Pt asymptomatic. MD Luberta Mutter notified. Orders received for 1 L bolus over 1 hour.   Stephannie Peters, RN

## 2017-03-15 NOTE — Clinical Social Work Placement (Signed)
   CLINICAL SOCIAL WORK PLACEMENT  NOTE  Date:  03/15/2017  Patient Details  Name: Theresa Frank MRN: [id number] Date of Birth: 1924-12-17  Clinical Social Work is seeking post-discharge placement for this patient at the Skilled  Nursing Facility level of care (*CSW will initial, date and re-position this form in  chart as items are completed):  Yes   Patient/family provided with [facility] Clinical Social Work Department's list of facilities offering this level of care within the geographic area requested by the patient (or if unable, by the patient's family).  Yes   Patient/family informed of their freedom to choose among providers that offer the needed level of care, that participate in Medicare, Medicaid or managed care program needed by the patient, have an available bed and are willing to accept the patient.  Yes   Patient/family informed of [facility]'s ownership interest in [facility] and [facility], as well as of the fact that they are under no obligation to receive care at these facilities.  PASRR submitted to EDS on       PASRR number received on       Existing PASRR number confirmed on 03/15/17     FL2 transmitted to all facilities in geographic area requested by pt/family on 03/15/17     FL2 transmitted to all facilities within larger geographic area on       Patient informed that his/her managed care company has contracts with or will negotiate with certain facilities, including the following:        Yes   Patient/family informed of bed offers received.  Patient chooses bed at  ([facility] )     Physician recommends and patient chooses bed at      Patient to be transferred to   on  .  Patient to be transferred to facility by       Patient family notified on   of transfer.  Name of family member notified:        PHYSICIAN       Additional Comment:    _______________________________________________ Mikeria Valin, Darleen Crocker, LCSW 03/15/2017, 10:27 AM

## 2017-03-15 NOTE — Clinical Social Work Note (Signed)
Clinical Social Work Assessment  Patient Details  Name: Theresa Frank MRN: [id number] Date of Birth: [birth year]  Date of referral:  03/15/17               Reason for consult:  Facility Placement                Permission sought to share information with:  Chartered certified accountant granted to share information::  Yes, Verbal Permission Granted  Name::      [employer]::   [place]   Relationship::     Contact Information:     Housing/Transportation Living arrangements for the past 2 months:  [place], [name] of Information:  Patient, Adult Children, Facility Patient Interpreter Needed:  None Criminal Activity/Legal Involvement Pertinent to Current Situation/Hospitalization:  No - Comment as needed Significant Relationships:  Adult Children Lives with:  Husband  Do you feel safe going back to the place where you live?  Yes Need for family participation in patient care:  Yes (Comment)  Care giving concerns:  Patient lives in [place] with her husband Theresa Frank.    Social Worker assessment / plan:  Holiday representative (CSW) received SNF consult. PT is recommending SNF. Per [facility] records and [facility]'s records patient used 22 SNF days at [facility] in June 2018 and will have a $50 co-pay per day at [facility]. CSW met with patient and her son Theresa Frank was at bedside. Patient was sitting up in the chair and was pleasantly confused. Per son patient lives in [place] with her husband. CSW explained SNF process and that patient will be in her co-pay days at [facility]. Son verbalized his understanding and is agreeable to SNF search in [facility] [place] and prefers [facility]. FL2 complete and faxed out.   CSW presented bed offers to son and he chose [facility]. Plan is for patient to D/C to [facility] tomorrow pending medical clearance. [facility] liaison is aware of above. CSW will continue to follow and assist as needed.     Employment status:   Retired Nurse, adult PT Recommendations:  [place] / Referral to community resources:  [place]  Patient/Family's Response to care:  Patient's son Theresa Frank is agreeable for patient to D/C to [facility] for rehab.   Patient/Family's Understanding of and Emotional Response to Diagnosis, Current Treatment, and Prognosis:  Patient's son was very pleasant and thanked CSW for assistance.   Emotional Assessment Appearance:  Appears stated age Attitude/Demeanor/Rapport:    Affect (typically observed):  Pleasant Orientation:  Oriented to Self, Oriented to Place, Fluctuating Orientation (Suspected and/or reported Sundowners) Alcohol / Substance use:  Not Applicable Psych involvement (Current and /or in the community):  No (Comment)  Discharge Needs  Concerns to be addressed:  Discharge Planning Concerns Readmission within the last 30 days:  No Current discharge risk:  Dependent with Mobility, Cognitively Impaired Barriers to Discharge:  Continued Medical Work up   [employer], Veronia Beets, LCSW 03/15/2017, 10:28 AM

## 2017-03-15 NOTE — Evaluation (Signed)
Physical Therapy Evaluation Patient Details Name: Theresa Frank MRN: [id number] DOB: Sep 11, 1924 Today's Date: 03/15/2017   History of Present Illness  Pt is a 83 y/o F who presented s/p fall and L hip pain.  Pt found to have impacted fx of L femoral neck and acute fracture of L pubic symphysis.  Pt is now s/p L hip hemiarthroplasty.  Pt was recently in hospital for L pelvic fractures.  Pt's PMH includes Alzheimer's dementia, MI.    Clinical Impression  Patient is s/p above surgery resulting in functional limitations due to the deficits listed below (see PT Problem List). Ms. Falter was very pleasant and agreeable to therapy.  She requires min assist for bed mobility and for transfers.  Close min guard assist provided when ambulating with cues for proper technique and posture.  Pt requires cues at the end of the session to recall precautions as she could only remember 1/3.  Patient will benefit from skilled PT to increase their independence and safety with mobility to allow discharge to the venue listed below.      Follow Up Recommendations SNF    Equipment Recommendations  Other (comment) (TBD at next venue of care)    Recommendations for Other Services       Precautions / Restrictions Precautions Precautions: Fall;Posterior Hip Precaution Booklet Issued: Yes (comment) Precaution Comments: Pt able to remember 1/3 hip precautions at end of session, pt educated on the remaining 2. Restrictions Weight Bearing Restrictions: Yes LLE Weight Bearing: Weight bearing as tolerated      Mobility  Bed Mobility Overal bed mobility: Needs Assistance Bed Mobility: Supine to Sit     Supine to sit: HOB elevated;Min assist     General bed mobility comments: Pt requires increased time and effort with cues for sequencing provided.  Min assist to advance LEs to EOB.    Transfers Overall transfer level: Needs assistance Equipment used: Rolling walker (2 wheeled) Transfers: Sit to/from  Stand Sit to Stand: Min assist         General transfer comment: Min assist to steady with sit>stand and pt demonstrates instability posteriorly.  Cues for technique and to stand upright once standing.    Ambulation/Gait Ambulation/Gait assistance: Min guard Ambulation Distance (Feet): 60 Feet Assistive device: Rolling walker (2 wheeled) Gait Pattern/deviations: Step-to pattern;Step-through pattern;Decreased stride length;Decreased stance time - left;Decreased step length - right;Decreased weight shift to left;Antalgic Gait velocity: decreased Gait velocity interpretation: Below normal speed for age/gender General Gait Details: Cues for upright posture and sequencing using RW.  Pt initially with step to pattern and transitions to step through pattern.  Cues to relax shoulders/arms as pt reporting wrist pain due to heavy WBing through RW.  Pt has fear of falling and requires encouragement when ambulating.    Stairs            Wheelchair Mobility    Modified Rankin (Stroke Patients Only)       Balance Overall balance assessment: Needs assistance;History of Falls Sitting-balance support: No upper extremity supported;Feet unsupported Sitting balance-Leahy Scale: Good     Standing balance support: Bilateral upper extremity supported;During functional activity Standing balance-Leahy Scale: Poor Standing balance comment: Requires BUE support for static and dynamic activity                             Pertinent Vitals/Pain Pain Assessment: Faces Faces Pain Scale: Hurts little more Pain Location: L hip  Pain Descriptors / Indicators:  Sore Pain Intervention(s): Limited activity within patient's tolerance;Monitored during session;Repositioned;Premedicated before session    Home Living Family/patient expects to be discharged to:: Skilled nursing facility Living Arrangements: Spouse/significant other                    Prior Function Level of Independence:  Needs assistance   Gait / Transfers Assistance Needed: Pt reports she ambualtes without AD and has had several falls in the past 6 months.  One that was just last month resulting in a pelvic fx.  Was sent to Rehab following hospital stay (pt WBAT) and has since returned home where she had this fall.  ADL's / Homemaking Assistance Needed: Pt reports she is independent with bathing, dressing.  Pt likely required some assist with ADLs, IADLs.  No family present to confirm information given.        Hand Dominance        Extremity/Trunk Assessment   Upper Extremity Assessment Upper Extremity Assessment:  (BUE strength grossly 4/5)    Lower Extremity Assessment Lower Extremity Assessment: LLE deficits/detail;RLE deficits/detail RLE Deficits / Details: RLE strength grossly 4/5 LLE Deficits / Details: LLE strength grossly 2+/5, limited by pain.      Cervical / Trunk Assessment Cervical / Trunk Assessment: Kyphotic  Communication   Communication: HOH  Cognition Arousal/Alertness: Awake/alert Behavior During Therapy: WFL for tasks assessed/performed Overall Cognitive Status: No family/caregiver present to determine baseline cognitive functioning                                        General Comments General comments (skin integrity, edema, etc.): BP seated in bed 89/72, standing 88/49, seated at end of session 87/49.  Pt denies dizziness throughout session.    Exercises General Exercises - Lower Extremity Ankle Circles/Pumps: AROM;Both;10 reps;Supine Quad Sets: Strengthening;Both;10 reps;Supine Hip ABduction/ADduction: AAROM;Left;10 reps;Supine   Assessment/Plan    PT Assessment Patient needs continued PT services  PT Problem List Decreased strength;Decreased range of motion;Decreased activity tolerance;Decreased balance;Decreased mobility;Decreased cognition;Decreased knowledge of use of DME;Decreased safety awareness;Pain;Decreased knowledge of precautions        PT Treatment Interventions DME instruction;Gait training;Stair training;Functional mobility training;Therapeutic activities;Therapeutic exercise;Balance training;Neuromuscular re-education;Patient/family education;Modalities    PT Goals (Current goals can be found in the Care Plan section)  Acute Rehab PT Goals Patient Stated Goal: to get stronger PT Goal Formulation: With patient Time For Goal Achievement: 03/29/17 Potential to Achieve Goals: Good    Frequency BID   Barriers to discharge   Unsure of amount of assist avaialble at home    Co-evaluation               AM-PAC PT "6 Clicks" Daily Activity  Outcome Measure Difficulty turning over in bed (including adjusting bedclothes, sheets and blankets)?: None Difficulty moving from lying on back to sitting on the side of the bed? : A Little Difficulty sitting down on and standing up from a chair with arms (e.g., wheelchair, bedside commode, etc,.)?: Total Help needed moving to and from a bed to chair (including a wheelchair)?: A Little Help needed walking in hospital room?: A Little Help needed climbing 3-5 steps with a railing? : A Little 6 Click Score: 17    End of Session Equipment Utilized During Treatment: Gait belt Activity Tolerance: Patient tolerated treatment well Patient left: in chair;with call bell/phone within reach;with chair alarm set Nurse Communication: Mobility status PT Visit  Diagnosis: Pain;Repeated falls (R29.6);Muscle weakness (generalized) (M62.81);Unsteadiness on feet (R26.81) Pain - Right/Left: Left Pain - part of body: Hip    Time: 7858-8502 PT Time Calculation (min) (ACUTE ONLY): 40 min   Charges:   PT Evaluation $PT Eval Low Complexity: 1 Procedure PT Treatments $Gait Training: 8-22 mins $Therapeutic Exercise: 8-22 mins   PT G Codes:        Encarnacion Chu PT, DPT 03/15/2017, 10:36 AM

## 2017-03-15 NOTE — Evaluation (Signed)
Occupational Therapy Evaluation Patient Details Name: Theresa Frank MRN: [id number] DOB: [birth year] Today's Date: 03/15/2017    History of Present Illness Pt. is a 83 y.o. female who was admitted to [facility] for a Left Hip Hemiarthrplasty repair of a Left Femoral Neck Fracture., and Left Pubic Symphysis Fracture.   Clinical Impression   Pt. Presents with weakness, limited functional mobility, impaired cognition, and posterior hip precautions which hinders her ability to complete ADL, and IADL functioning. Pt. Son was present. Pt. Session was shortened as pt. Was being prepared to have a blood transfusion. Pt. Husband is concerned about pt. Seating at home being too low.  Pt. Could benefit from skilled OT services for ADL, and IADL functioning. Pt. has plans to go to [facility] upon discharge. Pt. Could benefit from follow-up OT services.    Follow Up Recommendations  SNF    Equipment Recommendations       Recommendations for Other Services       Precautions / Restrictions Precautions Precautions: Fall;Posterior Hip Precaution Booklet Issued: Yes (comment) Precaution Comments: Pt able to remember 1/3 hip precautions at end of session, pt educated on the remaining 2. Restrictions Weight Bearing Restrictions: Yes LLE Weight Bearing: Weight bearing as tolerated             ADL either performed or assessed with clinical judgement   ADL Overall ADL's : Needs assistance/impaired Eating/Feeding: Set up   Grooming: Set up   Upper Body Bathing: Minimal assistance   Lower Body Bathing: Maximal assistance   Upper Body Dressing : Minimal assistance   Lower Body Dressing: Maximal assistance               Functional mobility during ADLs: Min guard General ADL Comments: Pt. and family vebal education was provided about OT services, daily routines, and A/E use for LE ADLs.     Vision Patient Visual Report: No change from baseline       Perception     Praxis       Pertinent Vitals/Pain Pain Assessment: No/denies pain Faces Pain Scale: Hurts little more Pain Location: L hip  Pain Descriptors / Indicators: Sore Pain Intervention(s): Limited activity within patient's tolerance;Monitored during session;Repositioned;Premedicated before session     Hand Dominance     Extremity/Trunk Assessment Upper Extremity Assessment Upper Extremity Assessment: Overall WFL for tasks assessed   Cervical / Trunk Assessment Cervical / Trunk Assessment: Kyphotic   Communication Communication Communication: HOH   Cognition Arousal/Alertness: Awake/alert Behavior During Therapy: WFL for tasks assessed/performed Overall Cognitive Status: History of cognitive impairments - at baseline                                     General Comments    Exercises   Shoulder Instructions      Home Living Family/patient expects to be discharged to:: Skilled nursing facility Living Arrangements: Spouse/significant other Available Help at Discharge: Family Type of Home: House Home Access: Ramped entrance     Home Layout: Two level;Able to live on main level with bedroom/bathroom     Bathroom Shower/Tub: Curtain;Tub/shower unit   Bathroom Toilet: Handicapped height     Home Equipment: Environmental consultant - 2 wheels;Cane - single point;Grab bars - tub/shower;Shower seat          Prior Functioning/Environment Level of Independence: Needs assistance  Gait / Transfers Assistance Needed: Pt reports she ambualtes without AD and has had several  falls in the past 6 months.  One that was just last month resulting in a pelvic fx.  Was sent to Rehab following hospital stay (pt WBAT) and has since returned home where she had this fall. ADL's / Homemaking Assistance Needed: Pt. has daily personal care aides to assist with pt., and her husband. with daily personal care, and morning care. Pt. Family assists with meals, and medication management. Family keeps pt.'s medication  locked up.  Communication / Swallowing Assistance Needed: HOH          OT Problem List:        OT Treatment/Interventions: Self-care/ADL training;Therapeutic exercise;Energy conservation;Patient/family education;Therapeutic activities;DME and/or AE instruction    OT Goals(Current goals can be found in the care plan section) Acute Rehab OT Goals Patient Stated Goal: to get stronger OT Goal Formulation: With patient Potential to Achieve Goals: Good  OT Frequency: Min 2X/week   Barriers to D/C:            Co-evaluation              AM-PAC PT "6 Clicks" Daily Activity     Outcome Measure Help from another person eating meals?: None Help from another person taking care of personal grooming?: None Help from another person toileting, which includes using toliet, bedpan, or urinal?: A Lot Help from another person bathing (including washing, rinsing, drying)?: A Lot Help from another person to put on and taking off regular upper body clothing?: A Lot Help from another person to put on and taking off regular lower body clothing?: A Lot 6 Click Score: 16   End of Session    Activity Tolerance: Patient tolerated treatment well Patient left: in chair;with call bell/phone within reach;with chair alarm set  OT Visit Diagnosis: Repeated falls (R29.6)                Time: 8138-8719 OT Time Calculation (min): 15 min Charges:  OT General Charges $OT Visit: 1 Procedure OT Evaluation $OT Eval Moderate Complexity: 1 Procedure G-Codes: OT G-codes **NOT FOR INPATIENT CLASS** Functional Limitation: Self care  Olegario Messier, MS, OTR/L Olegario Messier, MS, OTR/L 03/15/2017, 11:58 AM

## 2017-03-15 NOTE — Progress Notes (Signed)
Physical Therapy Treatment Patient Details Name: Theresa Frank MRN: [id number] DOB: Jul 07, 1925 Today's Date: 03/15/2017    History of Present Illness Pt is a 83 y/o F who presented s/p fall and L hip pain.  Pt found to have impacted fx of L femoral neck and acute fracture of L pubic symphysis.  Pt is now s/p L hip hemiarthroplasty.  Pt was recently in hospital for L pelvic fractures.  Pt's PMH includes Alzheimer's dementia, MI.    PT Comments    Ms. Norwood was limited by soreness in L hip this session but was agreeable to work with PT.  She ambulated 15 ft with RW and continues to require min assist for sit>stand due to posterior LOB.  Pt continues to require reminders of her posterior hip precautions as she is unable to remember.  SNF remains most appropriate d/c plan at this time.    Follow Up Recommendations  SNF     Equipment Recommendations  Other (comment) (TBD at next venue of care)    Recommendations for Other Services       Precautions / Restrictions Precautions Precautions: Fall;Posterior Hip Precaution Comments: Pt unable to recall any of her hip precautions at start of session or at end of session after they were reviewed.  Educated pt in precautions and pt instructed to read over her handout if she she forgets. Restrictions Weight Bearing Restrictions: Yes LLE Weight Bearing: Weight bearing as tolerated    Mobility  Bed Mobility Overal bed mobility: Needs Assistance Bed Mobility: Supine to Sit     Supine to sit: Min guard;HOB elevated     General bed mobility comments: Pt requires increased time and effort.  She uses the bed rail with HOB elevated.  Transfers Overall transfer level: Needs assistance Equipment used: Rolling walker (2 wheeled) Transfers: Sit to/from Stand Sit to Stand: Min assist         General transfer comment: Min assist due to posterior LOB when standing.  Cues for hand placement and to scoot to edge of bed prior to standing.  Pt  controls descent to chair well.  Ambulation/Gait Ambulation/Gait assistance: Min guard Ambulation Distance (Feet): 15 Feet Assistive device: Rolling walker (2 wheeled) Gait Pattern/deviations: Step-to pattern;Decreased stride length;Decreased stance time - left;Decreased step length - right;Decreased weight shift to left;Antalgic Gait velocity: decreased Gait velocity interpretation: Below normal speed for age/gender General Gait Details: Cues for upright posture and to keep hands on RW as pt has tendency to let go of RW and reach out for bed rail, countertop.  Limited distance due to soreness.     Stairs            Wheelchair Mobility    Modified Rankin (Stroke Patients Only)       Balance Overall balance assessment: Needs assistance;History of Falls Sitting-balance support: No upper extremity supported;Feet unsupported Sitting balance-Leahy Scale: Good     Standing balance support: Bilateral upper extremity supported;During functional activity Standing balance-Leahy Scale: Poor Standing balance comment: Requires BUE support for static and dynamic activity                            Cognition Arousal/Alertness: Awake/alert Behavior During Therapy: WFL for tasks assessed/performed;Anxious Overall Cognitive Status: No family/caregiver present to determine baseline cognitive functioning  General Comments: Pt anxious about falling and anxious about her L hip pain      Exercises General Exercises - Lower Extremity Ankle Circles/Pumps: AROM;Both;10 reps;Supine Long Arc Quad: AROM;Left;10 reps;Seated Hip ABduction/ADduction: AAROM;Left;10 reps;Supine    General Comments General comments (skin integrity, edema, etc.): BP supine at start of sesion 91/46 with mild dizziness, standing at bedside 96/46 with mild dizziness, 112/57 seated at end of session and pt denies dizziness      Pertinent Vitals/Pain Pain  Assessment: 0-10 Pain Score: 10-Worst pain ever Pain Location: L hip  Pain Descriptors / Indicators: Sore Pain Intervention(s): Limited activity within patient's tolerance;Monitored during session;Patient requesting pain meds-RN notified;Repositioned    Home Living                      Prior Function            PT Goals (current goals can now be found in the care plan section) Acute Rehab PT Goals Patient Stated Goal: to get stronger PT Goal Formulation: With patient Time For Goal Achievement: 03/29/17 Potential to Achieve Goals: Good Progress towards PT goals: Progressing toward goals    Frequency    BID      PT Plan Current plan remains appropriate    Co-evaluation              AM-PAC PT "6 Clicks" Daily Activity  Outcome Measure  Difficulty turning over in bed (including adjusting bedclothes, sheets and blankets)?: None Difficulty moving from lying on back to sitting on the side of the bed? : Total (use of bed rail) Difficulty sitting down on and standing up from a chair with arms (e.g., wheelchair, bedside commode, etc,.)?: Total Help needed moving to and from a bed to chair (including a wheelchair)?: A Little Help needed walking in hospital room?: A Little Help needed climbing 3-5 steps with a railing? : A Little 6 Click Score: 15    End of Session Equipment Utilized During Treatment: Gait belt Activity Tolerance: Patient limited by pain Patient left: in chair;with call bell/phone within reach;with chair alarm set Nurse Communication: Mobility status;Other (comment);Patient requests pain meds (BP readings) PT Visit Diagnosis: Pain;Repeated falls (R29.6);Muscle weakness (generalized) (M62.81);Unsteadiness on feet (R26.81) Pain - Right/Left: Left Pain - part of body: Hip     Time: 1194-1740 PT Time Calculation (min) (ACUTE ONLY): 28 min  Charges:  $Gait Training: 8-22 mins $Therapeutic Activity: 8-22 mins                    G Codes:        Encarnacion Chu PT, DPT 03/15/2017, 3:58 PM

## 2017-03-15 NOTE — Progress Notes (Signed)
   Subjective: 1 Day Post-Op Procedure(s) (LRB): ARTHROPLASTY BIPOLAR HIP (HEMIARTHROPLASTY) (Left) Patient reports pain as Being sore but no real pain..   Patient is well, and has had no acute complaints or problems We will start therapy today.  Plan is to go Rehab after hospital stay. no nausea and no vomiting Patient denies any chest pains or shortness of breath. Patient resting well. No complaints  Objective: Vital signs in last 24 hours: Temp:  [97.2 F (36.2 C)-98.7 F (37.1 C)] 98.6 F (37 C) (07/10 0400) Pulse Rate:  [68-86] 73 (07/10 0605) Resp:  [12-20] 16 (07/10 0400) BP: (79-151)/(46-94) 91/52 (07/10 0605) SpO2:  [94 %-100 %] 98 % (07/10 0400) Weight:  [47.2 kg (104 lb)-52.2 kg (115 lb)] 52.2 kg (115 lb) (07/10 0612) well approximated incision Heels are non tender and elevated off the bed using rolled towels Intake/Output from previous day: 07/09 0701 - 07/10 0700 In: 3650.8 [I.V.:2500.8; IV Piggyback:1150] Out: 1055 [Urine:205; Drains:100; Blood:750] Intake/Output this shift: No intake/output data recorded.   Recent Labs  03/14/17 1242 03/14/17 2213 03/15/17 0350  HGB 12.0 10.9* 9.1*    Recent Labs  03/14/17 1242 03/14/17 2213 03/15/17 0350  WBC 12.0*  --  8.9  RBC 3.97  --  3.03*  HCT 35.3 32.4* 26.8*  PLT 208  --  198    Recent Labs  03/14/17 1242 03/15/17 0350  NA 138 140  K 4.2 4.1  CL 103 109  CO2 27 27  BUN 21* 22*  CREATININE 0.82 0.90  GLUCOSE 109* 133*  CALCIUM 9.0 8.0*    Recent Labs  03/14/17 1457  INR 1.12    EXAM General - Patient is Alert, Appropriate and Oriented Extremity - Neurologically intact Neurovascular intact Sensation intact distally Intact pulses distally No cellulitis present Compartment soft Dressing - dressing C/D/I Motor Function - intact, moving foot and toes well on exam.    Past Medical History:  Diagnosis Date  . Alzheimer's dementia, late onset, with behavioral disturbance   . Asthma    . Colon polyps   . Diverticulosis   . Hypertension   . MI (myocardial infarction) (HCC)     Assessment/Plan: 1 Day Post-Op Procedure(s) (LRB): ARTHROPLASTY BIPOLAR HIP (HEMIARTHROPLASTY) (Left) Active Problems:   Left displaced femoral neck fracture (HCC)  Estimated body mass index is 18.56 kg/m as calculated from the following:   Height as of this encounter: 5\' 6"  (1.676 m).   Weight as of this encounter: 52.2 kg (115 lb). Advance diet Up with therapy D/C IV fluids Plan for discharge tomorrow Discharge to SNF  Labs: Were reviewed. Hemoglobin 9.1 down from 10.5 DVT Prophylaxis - Lovenox, Foot Pumps and TED hose Weight-Bearing as tolerated to left leg D/C O2 and Pulse OX and try on Room Air Begin working on bowel movement Labs tomorrow morning. Staples will need to be removed on 06/29/2017 and apply benzoin and Steri-Strips Patient will need to follow-up in [facility] orth[facility]ics in 6 weeks Posterior hip precautions Lovenox 30 mg twice a day for 14 days once she leaves the hospital TED stockings are to be worn bilaterally. These may be removed one hour per 8 hour shift Heels will need to be elevated off the bed using rolled towels.  Lynnda Shields. [facility] 03/15/2017, 7:27 AM

## 2017-03-15 NOTE — Progress Notes (Signed)
SNF and Non-Emergent EMS Transport Benefits:  Number called: [phone] Rep: Ronan Reference Number: [id number]  [place] PPO plan active since 09/06/12 with no deductible.  Out of pocket max is $4000, of which $400 met so far.  In-network SNF: $0 copay for days 1-20 and a $50 daily copay for days 21-100.  Once out of pocket is reached, patient covered at 100% for remainder of 100 day benefit period. Per rep, patient has used 22 SNF days in her current benefit period. $0 copay for professional fees and 3 day hospital stay is not required.  Josem Kaufmann is required: [phone].    Non-emergent EMS transport: $75 copay for each one way medically necessary, Medicare covered trip.  Josem Kaufmann is not required.

## 2017-03-15 NOTE — Progress Notes (Signed)
Patient BP 109/68 after fluid Bolus, then dropped to 86/56 again. Also, output very little from catheter. MD notified, ordered another Bolus NS. Nursing continues to monitor.

## 2017-03-16 LAB — BPAM RBC
Blood Product Expiration Date: 201807242359
ISSUE DATE / TIME: 201807101141
UNIT TYPE AND RH: 6200

## 2017-03-16 LAB — TYPE AND SCREEN
ABO/RH(D): A POS
Antibody Screen: NEGATIVE
Unit division: 0

## 2017-03-16 LAB — CBC
HCT: 24.4 % — ABNORMAL LOW (ref 35.0–47.0)
HEMOGLOBIN: 8.4 g/dL — AB (ref 12.0–16.0)
MCH: 30.4 pg (ref 26.0–34.0)
MCHC: 34.3 g/dL (ref 32.0–36.0)
MCV: 88.6 fL (ref 80.0–100.0)
Platelets: 161 10*3/uL (ref 150–440)
RBC: 2.75 MIL/uL — ABNORMAL LOW (ref 3.80–5.20)
RDW: 17.5 % — AB (ref 11.5–14.5)
WBC: 6.7 10*3/uL (ref 3.6–11.0)

## 2017-03-16 LAB — BASIC METABOLIC PANEL WITH GFR
Anion gap: 4 — ABNORMAL LOW (ref 5–15)
BUN: 23 mg/dL — ABNORMAL HIGH (ref 6–20)
CO2: 25 mmol/L (ref 22–32)
Calcium: 8 mg/dL — ABNORMAL LOW (ref 8.9–10.3)
Chloride: 110 mmol/L (ref 101–111)
Creatinine, Ser: 1.06 mg/dL — ABNORMAL HIGH (ref 0.44–1.00)
GFR calc Af Amer: 52 mL/min — ABNORMAL LOW (ref >60)
GFR calc non Af Amer: 45 mL/min — ABNORMAL LOW (ref >60)
Glucose, Bld: 85 mg/dL (ref 65–99)
Potassium: 3.7 mmol/L (ref 3.5–5.1)
Sodium: 139 mmol/L (ref 135–145)

## 2017-03-16 LAB — SURGICAL PATHOLOGY

## 2017-03-16 MED ORDER — FERROUS SULFATE 325 (65 FE) MG PO TABS: 325.0000 mg | ORAL_TABLET | Freq: Two times a day (BID) | ORAL | 1 refills | Status: AC

## 2017-03-16 MED ORDER — ENOXAPARIN SODIUM 30 MG/0.3ML ~~LOC~~ SOLN
30.0000 mg | SUBCUTANEOUS | 0 refills | Status: AC
Start: 2017-03-17 — End: ?

## 2017-03-16 MED ORDER — ENSURE ENLIVE PO LIQD: 237.0000 mL | Freq: Two times a day (BID) | ORAL | 12 refills | Status: AC

## 2017-03-16 MED ORDER — OXYCODONE HCL 5 MG PO TABS: 5.0000 mg | ORAL_TABLET | Freq: Four times a day (QID) | ORAL | 0 refills | Status: AC | PRN

## 2017-03-16 MED ORDER — ENOXAPARIN SODIUM 30 MG/0.3ML ~~LOC~~ SOLN: 30.0000 mg | SUBCUTANEOUS | Status: DC

## 2017-03-16 NOTE — Progress Notes (Signed)
Called report to [place], LPN at [employer], answered all questions. Son to transport via wc in personal vehicle. All papers sent with patient's son.

## 2017-03-16 NOTE — Discharge Summary (Signed)
Theresa Frank, is a 83 y.o. female  DOB 1925/04/03  MRN [id number].  Admission date:  03/14/2017  Admitting Physician  Milagros Loll, MD  Discharge Date:  03/16/2017   Primary MD  Kandyce Rud, MD  Recommendations for primary care physician for things to follow:  Discharged to [facility], follow-up with PCP in one week Follow up with [facility] ortho in 6 weeks.   Admission Diagnosis  Closed fracture of neck of left femur, initial encounter (HCC) [S72.002A]   Discharge Diagnosis  Closed fracture of neck of left femur, initial encounter (HCC) [S72.002A]    Active Problems:   Left displaced femoral neck fracture (HCC)      Past Medical History:  Diagnosis Date  . Alzheimer's dementia, late onset, with behavioral disturbance   . Asthma   . Colon polyps   . Diverticulosis   . Hypertension   . MI (myocardial infarction) [facility])     Past Surgical History:  Procedure Laterality Date  . ABDOMINAL HYSTERECTOMY    . HERNIA REPAIR    . HIP ARTHROPLASTY Left 03/14/2017   Procedure: ARTHROPLASTY BIPOLAR HIP (HEMIARTHROPLASTY);  Surgeon: Donato Heinz, MD;  Location: [facility] ORS;  Service: Orthopedics;  Laterality: Left;       History of present illness and  Hospital Course:     Kindly see H&P for history of present illness and admission details, please review complete Labs, Consult reports and Test reports for all details in brief  HPI  from the history and physical done on the day of admission 83 year old female patient had a fall and found to have a left hip fracture. Patient has a history of frequent falls, admitted recently to hospital for. Pubic ramus  fracture. Admitted to hospitalist service for left hip fracture.   Hospital Course  #1 acute left femoral neck fracture,; admitted to medical service, seen by orthopedic  Dr. Ernest Pine, Asian her left hip hemiarthroplasty by Dr. Ernest Pine on July 9. Postoperatively patient did well with physical therapy but continued to have some left hip pain. Stable for discharge today with Lovenox for 2 weeks, follow-up with [facility] in 6 weeks, [facility]. Pain control with Tylenol  2. acute blood loss anemia; due to hip repair. Patient lost about 750 mL of blood as per documentation. Received 1 unit of packed RBC on July 10. Continue  ferrous sulfate for history of iron deficiency anemia.   3. hypotension due to volume depletion: Improved with aggressive hydration, blood transfusion.  #4 Alzheimer's dementia; continue Aricept  Discharge Condition:   Follow UP  Contact information for after-discharge care    Destination    [facility] .   Specialty:  Skilled Nursing Facility Contact information: [address] [place] [address] [phone]                Discharge Instructions  and  Discharge Medications      Allergies as of 03/16/2017      Reactions   Ciprofloxacin Other (See Comments)   Reaction: unknown   Penicillins Other (See Comments)   Reaction: unknown Has patient had a PCN reaction causing immediate rash, facial/tongue/throat swelling, SOB or lightheadedness with hypotension: Unknown Has patient had a PCN reaction causing severe rash involving mucus membranes or skin necrosis: Unknown Has patient had a PCN reaction that required hospitalization: Unknown Has patient had a PCN reaction occurring within the last 10 years: Unknown If all of the above answers are "  NO", then may proceed with Cephalosporin use.   Premarin [conjugated Estrogens] Other (See Comments)   Reaction: unknown   Septra [sulfamethoxazole-trimethoprim] Other (See Comments)   Reaction: unknown   Zocor [simvastatin] Other (See Comments)   Reaction: unknown      Medication List    TAKE these medications   ADVAIR DISKUS  250-50 MCG/DOSE Aepb Generic drug:  Fluticasone-Salmeterol Inhale 1 puff into the lungs 2 (two) times daily.   aspirin 81 MG EC tablet Take 1 tablet (81 mg total) by mouth daily.   donepezil 5 MG tablet Commonly known as:  ARICEPT Take 5 mg by mouth at bedtime.   enoxaparin 30 MG/0.3ML injection Commonly known as:  LOVENOX Inject 0.3 mLs (30 mg total) into the skin daily. Start taking on:  03/17/2017   feeding supplement (ENSURE ENLIVE) Liqd Take 237 mLs by mouth 2 (two) times daily between meals.   ferrous sulfate 325 (65 FE) MG tablet Take 1 tablet (325 mg total) by mouth 2 (two) times daily with a meal.   memantine 10 MG tablet Commonly known as:  NAMENDA Take 10 mg by mouth 2 (two) times daily.   mirtazapine 15 MG tablet Commonly known as:  REMERON Take 7.5 mg by mouth at bedtime.   montelukast 10 MG tablet Commonly known as:  SINGULAIR Take 10 mg by mouth daily.   oxyCODONE 5 MG immediate release tablet Commonly known as:  Oxy IR/ROXICODONE Take 1 tablet (5 mg total) by mouth every 6 (six) hours as needed for moderate pain. What changed:  when to take this   senna-docusate 8.6-50 MG tablet Commonly known as:  Senokot-S Take 1 tablet by mouth at bedtime.         Diet and Activity recommendation: See Discharge Instructions above   Consults obtained -ortho,PT   Major procedures and Radiology Reports - PLEASE review detailed and final reports for all details, in brief -      Ct Head Wo Contrast  Result Date: 03/14/2017 CLINICAL DATA:  Fall with head injury. Initial encounter. EXAM: CT HEAD WITHOUT CONTRAST TECHNIQUE: Contiguous axial images were obtained from the base of the skull through the vertex without intravenous contrast. COMPARISON:  01/10/2017 FINDINGS: Brain: No evidence of acute infarction, hemorrhage, hydrocephalus, extra-axial collection or mass lesion/mass effect. Moderate generalized atrophy. Chronic microvascular ischemic gliosis in the  cerebral white matter. Small remote right cerebellar infarct. Remote lacunar infarct in the right putamen. Small cortically based infarcts in the high right frontal lobe and right occipital pole on May 2018 brain MRI are subtle by CT. Vascular: No hyperdense vessel. Atherosclerotic calcification. Vertebrobasilar tortuosity. Skull: Negative for fracture Sinuses/Orbits: No acute finding IMPRESSION: 1. No evidence of intracranial injury. 2. Atrophy and chronic ischemic injury without change since May 2018. Electronically Signed   By: Marnee Spring M.D.   On: 03/14/2017 12:36   Dg Chest Portable 1 View  Result Date: 03/14/2017 CLINICAL DATA:  Patient lost balance this morning, patient states she fell backward. C/O left hip soreness. Patient's son states patient has been unable to stand and transfer to commode since fall. Has had similar falls in the past and has fractured pelvis. Pre op EXAM: PORTABLE CHEST 1 VIEW COMPARISON:  01/10/2017 FINDINGS: Heart is enlarged and stable in configuration. There are no focal consolidations. There is a small left pleural effusion or pleural thickening, stable in appearance. No acute displaced fractures are identified. No pneumothorax. IMPRESSION: No active disease. Electronically Signed   By: Norva Pavlov M.D.  On: 03/14/2017 12:26   Dg Hip Port Unilat With Pelvis 1v Left  Result Date: 03/14/2017 CLINICAL DATA:  83 y/o  F; status post left hip arthroplasty. EXAM: DG HIP (WITH OR WITHOUT PELVIS) 1V PORT LEFT COMPARISON:  None. FINDINGS: Left hip hemiarthroplasty is well seated. No periprosthetic fracture identified. Postsurgical changes with apparent fluid in the surrounding soft tissues, skin staples, and a drain. Stable fracture of the left symphysis pubis. Mesh anchors project over the pelvis. Mild osteoarthrosis of the right hip. IMPRESSION: 1. Left hip hemiarthroplasty with expected postsurgical changes. 2. Stable acute fracture of the left symphysis pubis.  Electronically Signed   By: Mitzi Hansen M.D.   On: 03/14/2017 22:49   Dg Hip Unilat With Pelvis 2-3 Views Left  Result Date: 03/14/2017 CLINICAL DATA:  Patient lost balance this morning, patient states she fell backward. C/O left hip soreness. Patient's son states patient has been unable to stand and transfer to commode since fall. Has had similar falls in the past and has fractured pelvis. EXAM: DG HIP (WITH OR WITHOUT PELVIS) 2-3V LEFT COMPARISON:  None. FINDINGS: There is an acute fracture of the left femoral neck associated varus angulation and impaction. There is a comminuted fracture of the left symphysis pubis with impaction of the superior and inferior pubic rami, also likely acute. No dislocation. IMPRESSION: 1. Impacted fracture of the left femoral neck. 2. Fracture the left symphysis pubis, likely acute. Electronically Signed   By: Norva Pavlov M.D.   On: 03/14/2017 12:23    Micro Results     Recent Results (from the past 240 hour(s))  Surgical PCR screen     Status: None   Collection Time: 03/14/17 11:41 AM  Result Value Ref Range Status   MRSA, PCR NEGATIVE NEGATIVE Final   Staphylococcus aureus NEGATIVE NEGATIVE Final    Comment:        The Xpert SA Assay (FDA approved for NASAL specimens in patients over 17 years of age), is one component of a comprehensive surveillance program.  Test performance has been validated by [facility] for patients greater than or equal to 44 year old. It is not intended to diagnose infection nor to guide or monitor treatment.        Today   Subjective:   Theresa Frank today stable for discharge  Objective:   Blood pressure 118/70, pulse (!) 103, temperature 98.5 F (36.9 C), temperature source Oral, resp. rate 19, height 5\' 6"  (1.676 m), weight 50.3 kg (110 lb 14.4 oz), SpO2 95 %.   Intake/Output Summary (Last 24 hours) at 03/16/17 0928 Last data filed at 03/16/17 7829  Gross per 24 hour  Intake              1000 ml  Output              430 ml  Net              570 ml    Exam Awake Alert, Oriented x 3, No new F.N deficits, Normal affect [place].AT,PERRAL Supple Neck,No JVD, No cervical lymphadenopathy appriciated.  Symmetrical Chest wall movement, Good air movement bilaterally, CTAB RRR,No Gallops,Rubs or new Murmurs, No Parasternal Heave +ve B.Sounds, Abd Soft, Non tender, No organomegaly appriciated, No rebound -guarding or rigidity. No Cyanosis, Clubbing or edema, No new Rash or bruise  Data Review   CBC w Diff:  Lab Results  Component Value Date   WBC 6.7 03/16/2017   HGB 8.4 (L) 03/16/2017  HCT 24.4 (L) 03/16/2017   PLT 161 03/16/2017   LYMPHOPCT 6 03/14/2017   MONOPCT 4 03/14/2017   EOSPCT 0 03/14/2017   BASOPCT 1 03/14/2017    CMP:  Lab Results  Component Value Date   NA 139 03/16/2017   K 3.7 03/16/2017   CL 110 03/16/2017   CO2 25 03/16/2017   BUN 23 (H) 03/16/2017   CREATININE 1.06 (H) 03/16/2017   PROT 6.8 02/04/2017   ALBUMIN 2.8 (L) 02/04/2017   BILITOT 0.4 02/04/2017   ALKPHOS 85 02/04/2017   AST 27 02/04/2017   ALT 15 02/04/2017  .   Total Time in preparing paper work, data evaluation and todays exam - 35 minutes  Tolulope Pinkett M.D on 03/16/2017 at 9:28 AM    Note: This dictation was prepared with Dragon dictation along with smaller phrase technology. Any transcriptional errors that result from this process are unintentional.

## 2017-03-16 NOTE — NC FL2 (Signed)
[place] MEDICAID [id number] LEVEL OF CARE SCREENING TOOL     IDENTIFICATION  Patient Name: Theresa Frank Birthdate: Aug 31, 1925 Sex: female Admission Date (Current Location): 03/14/2017  [place] Number:  Chiropodist and Address:  [facility], [address], [place], [place] [address]      Provider Number: [id number]  Attending Physician Name and Address:  Katha Hamming, MD  Relative Name and Phone Number:       Current Level of Care: Hospital Recommended Level of Care: Skilled Nursing Facility Prior Approval Number:    Date Approved/Denied:   PASRR Number:  [id number])  Discharge Plan: SNF    Current Diagnoses: Patient Active Problem List   Diagnosis Date Noted  . Left displaced femoral neck fracture (HCC) 03/14/2017  . MI (myocardial infarction) (HCC)   . Pressure injury of skin 02/06/2017  . Pelvic fracture (HCC) 02/04/2017  . Elevated troponin 02/04/2017  . Fall 01/11/2017  . TIA (transient ischemic attack) 01/10/2017  . HTN (hypertension) 01/10/2017  . Asthma 01/10/2017  . Alzheimer disease 01/10/2017    Orientation RESPIRATION BLADDER Height & Weight     Self, Place  Normal Continent Weight: 110 lb 14.4 oz (50.3 kg) Height:  5\' 6"  (167.6 cm)  BEHAVIORAL SYMPTOMS/MOOD NEUROLOGICAL BOWEL NUTRITION STATUS   (none)  (none) Continent Regular Diet   AMBULATORY STATUS COMMUNICATION OF NEEDS Skin   Extensive Assist Verbally Surgical wounds (Incision: Left Hip. )                       Personal Care Assistance Level of Assistance  Bathing, Feeding, Dressing Bathing Assistance: Limited assistance Feeding assistance: Independent Dressing Assistance: Limited assistance     Functional Limitations Info  Sight, Hearing, Speech Sight Info: Adequate Hearing Info: Adequate Speech Info: Adequate    SPECIAL CARE FACTORS FREQUENCY  PT (By licensed PT), OT (By licensed OT)     PT Frequency:  (5) OT Frequency:   (5)            Contractures      Additional Factors Info  Code Status, Allergies Code Status Info:  (DNR ) Allergies Info:  (Ciprofloxacin, Penicillins, Premarin Conjugated Estrogens, Septra Sulfamethoxazole-trimethoprim, Zocor Simvastatin)           Current Medications (03/16/2017):  This is the current hospital active medication list Current Facility-Administered Medications  Medication Dose Route Frequency Provider Last Rate Last Dose  . 0.9 %  sodium chloride infusion   Intravenous Once Katha Hamming, MD      . acetaminophen (TYLENOL) tablet 650 mg  650 mg Oral Q6H PRN Hooten, Illene Labrador, MD       Or  . acetaminophen (TYLENOL) suppository 650 mg  650 mg Rectal Q6H PRN Hooten, Illene Labrador, MD      . albuterol (PROVENTIL) (2.5 MG/3ML) 0.083% nebulizer solution 2.5 mg  2.5 mg Nebulization Q2H PRN Sudini, Srikar, MD      . bisacodyl (DULCOLAX) suppository 10 mg  10 mg Rectal Daily PRN Donato Heinz, MD   10 mg at 03/16/17 0552  . donepezil (ARICEPT) tablet 5 mg  5 mg Oral QHS Milagros Loll, MD   5 mg at 03/15/17 2221  . [START ON 03/17/2017] enoxaparin (LOVENOX) injection 30 mg  30 mg Subcutaneous Q24H Demetrius Charity, RPH      . feeding supplement (ENSURE ENLIVE) (ENSURE ENLIVE) liquid 237 mL  237 mL Oral BID BM Katha Hamming, MD   237  mL at 03/16/17 1000  . ferrous sulfate tablet 325 mg  325 mg Oral BID WC Hooten, Illene Labrador, MD   325 mg at 03/16/17 0851  . hydrALAZINE (APRESOLINE) injection 10 mg  10 mg Intravenous Q6H PRN Sudini, Srikar, MD      . magnesium hydroxide (MILK OF MAGNESIA) suspension 30 mL  30 mL Oral Daily PRN Hooten, Illene Labrador, MD      . memantine (NAMENDA) tablet 10 mg  10 mg Oral BID Milagros Loll, MD   10 mg at 03/16/17 0850  . menthol-cetylpyridinium (CEPACOL) lozenge 3 mg  1 lozenge Oral PRN Hooten, Illene Labrador, MD       Or  . phenol (CHLORASEPTIC) mouth spray 1 spray  1 spray Mouth/Throat PRN Hooten, Illene Labrador, MD      . metoCLOPramide (REGLAN) tablet  5-10 mg  5-10 mg Oral Q8H PRN Hooten, Illene Labrador, MD       Or  . metoCLOPramide (REGLAN) injection 5-10 mg  5-10 mg Intravenous Q8H PRN Hooten, Illene Labrador, MD      . metoCLOPramide (REGLAN) tablet 10 mg  10 mg Oral TID AC & HS Hooten, Illene Labrador, MD   10 mg at 03/16/17 0850  . mirtazapine (REMERON) tablet 7.5 mg  7.5 mg Oral QHS Milagros Loll, MD   7.5 mg at 03/15/17 2221  . mometasone-formoterol (DULERA) 200-5 MCG/ACT inhaler 2 puff  2 puff Inhalation BID Milagros Loll, MD   2 puff at 03/16/17 0851  . montelukast (SINGULAIR) tablet 10 mg  10 mg Oral Daily Milagros Loll, MD   10 mg at 03/16/17 0851  . ondansetron (ZOFRAN) tablet 4 mg  4 mg Oral Q6H PRN Hooten, Illene Labrador, MD       Or  . ondansetron (ZOFRAN) injection 4 mg  4 mg Intravenous Q6H PRN Hooten, Illene Labrador, MD      . oxyCODONE (Oxy IR/ROXICODONE) immediate release tablet 5-10 mg  5-10 mg Oral Q4H PRN Hooten, Illene Labrador, MD      . pantoprazole (PROTONIX) EC tablet 40 mg  40 mg Oral BID Donato Heinz, MD   40 mg at 03/16/17 0849  . senna-docusate (Senokot-S) tablet 1 tablet  1 tablet Oral BID Donato Heinz, MD   1 tablet at 03/16/17 [phone]  . sodium phosphate (FLEET) 7-19 GM/118ML enema 1 enema  1 enema Rectal Once PRN Hooten, Illene Labrador, MD      . traMADol Janean Sark) tablet 50-100 mg  50-100 mg Oral Q4H PRN Hooten, Illene Labrador, MD         Discharge Medications: Please see discharge summary for a list of discharge medications.  Relevant Imaging Results:  Relevant Lab Results:   Additional Information  (SSN: [id number])  Fany Cavanaugh, Darleen Crocker, LCSW

## 2017-03-16 NOTE — Clinical Social Work Placement (Signed)
   CLINICAL SOCIAL WORK PLACEMENT  NOTE  Date:  03/16/2017  Patient Details  Name: Theresa Frank MRN: [id number] Date of Birth: Jun 30, 1925  Clinical Social Work is seeking post-discharge placement for this patient at the Skilled  Nursing Facility level of care (*CSW will initial, date and re-position this form in  chart as items are completed):  Yes   Patient/family provided with [facility] Clinical Social Work Department's list of facilities offering this level of care within the geographic area requested by the patient (or if unable, by the patient's family).  Yes   Patient/family informed of their freedom to choose among providers that offer the needed level of care, that participate in Medicare, Medicaid or managed care program needed by the patient, have an available bed and are willing to accept the patient.  Yes   Patient/family informed of [facility]'s ownership interest in [facility] and [facility], as well as of the fact that they are under no obligation to receive care at these facilities.  PASRR submitted to EDS on       PASRR number received on       Existing PASRR number confirmed on 03/15/17     FL2 transmitted to all facilities in geographic area requested by pt/family on 03/15/17     FL2 transmitted to all facilities within larger geographic area on       Patient informed that his/her managed care company has contracts with or will negotiate with certain facilities, including the following:        Yes   Patient/family informed of bed offers received.  [facility]     Physician recommends and patient chooses bed at      Patient to be transferred to  ([facility] on 03/16/17.  Patient to be transferred to facility by  (Patient's son Raiford Noble will transport patient to [facility]. )     Patient family notified on 03/16/17 of transfer.  Name of family member notified:   (Patient's son Raiford Noble is aware of D/C today. )     PHYSICIAN        Additional Comment:    _______________________________________________ Davide Risdon, Darleen Crocker, LCSW 03/16/2017, 10:06 AM

## 2017-03-16 NOTE — Progress Notes (Signed)
Patient is medically stable for D/C to [facility] today. Per Jom[facility] Longs [facility] liaison patient can come today after 12 noon to room 801. RN will call report to RN Konrad Dolores at [phone]. Patient's son Raiford Noble will transport patient. Clinical Child psychotherapist (CSW) sent D/C orders to [facility] via [facility]. Patient is aware of above. Patient's son Raiford Noble is at bedside and aware of above. Please reconsult if future social work needs arise. CSW signing off.   [employer], LCSW [phone]

## 2017-03-16 NOTE — Care Management Important Message (Signed)
Important Message  Patient Details  Name: KEMIRA HOLICK MRN: [id number] Date of Birth: June 03, 1925   Medicare Important Message Given:  N/A - LOS <3 / Initial given by admissions    Marily Memos, RN 03/16/2017, 10:14 AM

## 2017-03-16 NOTE — Progress Notes (Signed)
PT Cancellation Note  Patient Details Name: Theresa Frank MRN: [id number] DOB: 1924-09-22   Cancelled Treatment:    Reason Eval/Treat Not Completed: Other (comment) (Attempted to see pt but pt has been d/c).     Encarnacion Chu PT, DPT 03/16/2017, 4:20 PM

## 2017-03-16 NOTE — Progress Notes (Signed)
   Subjective: 2 Days Post-Op Procedure(s) (LRB): ARTHROPLASTY BIPOLAR HIP (HEMIARTHROPLASTY) (Left) Patient reports pain as mild.   Patient is well, and has had no acute complaints or problems Did well with physical therapy yesterday. Plan is to go Rehab after hospital stay. no nausea and no vomiting Patient denies any chest pains or shortness of breath. No complaints other than being cold.  Objective: Vital signs in last 24 hours: Temp:  [97.7 F (36.5 C)-98.6 F (37 C)] 98.5 F (36.9 C) (07/10 2153) Pulse Rate:  [67-103] 103 (07/10 2153) Resp:  [18-20] 19 (07/10 2153) BP: (79-118)/(45-72) 118/70 (07/10 2153) SpO2:  [95 %-99 %] 95 % (07/10 2153) Weight:  [50.3 kg (110 lb 14.4 oz)] 50.3 kg (110 lb 14.4 oz) (07/11 0552) well approximated incision Heels are non tender and elevated off the bed using rolled towels Intake/Output from previous day: 07/10 0701 - 07/11 0700 In: 1000 [P.O.:120; Blood:580; IV Piggyback:300] Out: 430 [Urine:225; Drains:205] Intake/Output this shift: No intake/output data recorded.   Recent Labs  03/14/17 2213 03/15/17 0350 03/15/17 1036 03/15/17 1848 03/16/17 0422  HGB 10.9* 9.1* 8.0* 8.8* 8.4*    Recent Labs  03/15/17 0350  03/15/17 1848 03/16/17 0422  WBC 8.9  --   --  6.7  RBC 3.03*  --   --  2.75*  HCT 26.8*  < > 25.0* 24.4*  PLT 198  --   --  161  < > = values in this interval not displayed.  Recent Labs  03/15/17 0350 03/16/17 0422  NA 140 139  K 4.1 3.7  CL 109 110  CO2 27 25  BUN 22* 23*  CREATININE 0.90 1.06*  GLUCOSE 133* 85  CALCIUM 8.0* 8.0*    Recent Labs  03/14/17 1457  INR 1.12    EXAM General - Patient is Alert, Appropriate and Oriented Extremity - Neurologically intact Neurovascular intact Sensation intact distally Intact pulses distally No cellulitis present Compartment soft Dressing - scant drainage Motor Function - intact, moving foot and toes well on exam.    Past Medical History:   Diagnosis Date  . Alzheimer's dementia, late onset, with behavioral disturbance   . Asthma   . Colon polyps   . Diverticulosis   . Hypertension   . MI (myocardial infarction) (HCC)     Assessment/Plan: 2 Days Post-Op Procedure(s) (LRB): ARTHROPLASTY BIPOLAR HIP (HEMIARTHROPLASTY) (Left) Active Problems:   Left displaced femoral neck fracture (HCC)  Estimated body mass index is 17.9 kg/m as calculated from the following:   Height as of this encounter: 5\' 6"  (1.676 m).   Weight as of this encounter: 50.3 kg (110 lb 14.4 oz). Up with therapy Discharge to SNF  Labs: Were reviewed. DVT Prophylaxis - Lovenox, Foot Pumps and TED hose Weight-Bearing as tolerated to left leg Hemovac discontinued on today's visit. Patient needs a bowel movement prior to discharge Staples will need to be removed on 06/29/2017 and apply benzoin and Steri-Strips Patient will need to follow-up in [facility] orthopedics in 6 weeks Posterior hip precautions Lovenox 30 mg twice a day for 14 days once she leaves the hospital TED stockings are to be worn bilaterally. These may be removed one hour per 8 hour shift Heels will need to be elevated off the bed using rolled towels.   Lynnda Shields. [facility] PA [facility] 03/16/2017, 7:07 AM

## 2017-03-16 NOTE — Progress Notes (Signed)
Enoxaparin   Patient qualifies for Enoxaparin 30 g SQ daily based on CrCl <30 ml/min per policy. Will change to Enoxaparin 30  mg SQ daily.  Rhilynn Preyer D. Reyn Faivre, PharmD   

## 2017-03-17 ENCOUNTER — Encounter: Payer: Self-pay | Admitting: Orthopedic Surgery

## 2017-07-01 ENCOUNTER — Encounter: Attending: Physician Assistant | Admitting: Physician Assistant

## 2017-07-01 DIAGNOSIS — I10 Essential (primary) hypertension: Secondary | ICD-10-CM | POA: Diagnosis not present

## 2017-07-01 DIAGNOSIS — G309 Alzheimer's disease, unspecified: Secondary | ICD-10-CM | POA: Insufficient documentation

## 2017-07-01 DIAGNOSIS — E440 Moderate protein-calorie malnutrition: Secondary | ICD-10-CM | POA: Insufficient documentation

## 2017-07-01 DIAGNOSIS — R627 Adult failure to thrive: Secondary | ICD-10-CM | POA: Diagnosis not present

## 2017-07-01 DIAGNOSIS — Z993 Dependence on wheelchair: Secondary | ICD-10-CM | POA: Diagnosis not present

## 2017-07-01 DIAGNOSIS — F0280 Dementia in other diseases classified elsewhere without behavioral disturbance: Secondary | ICD-10-CM | POA: Insufficient documentation

## 2017-07-01 DIAGNOSIS — L89323 Pressure ulcer of left buttock, stage 3: Secondary | ICD-10-CM | POA: Insufficient documentation

## 2017-07-04 NOTE — Progress Notes (Signed)
NORMAL, KUZMICH ([id number]) Visit Report for 07/01/2017 Allergy List Details Patient Name: KEYERA, LAPLANT. Date of Service: 07/01/2017 12:30 PM Medical Record Number: [id number] Patient Account Number: [id number] Date of Birth/Sex: 1924/12/20 (83 y.o. Female) Treating RN: Ashok Cordia, Debi Primary Care Jemario Poitras: BABAOFF, MARCUS Other Clinician: Referring Lakysha Kossman: BABAOFF, MARCUS Treating Yoshi Mancillas/Extender: STONE III, HOYT Weeks in Treatment: 0 Allergies Active Allergies Zocor cefuroxime axetil Ciproflaxin Reaction: itching Penicilin Premarin Septra Allergy Notes Electronic Signature(s) Signed: 07/01/2017 3:57:11 PM By: Alejandro Mulling Entered By: Alejandro Mulling on 07/01/2017 12:54:40 [name] ([id number]) -------------------------------------------------------------------------------- Arrival Information Details Patient Name: [name] Date of Service: 07/01/2017 12:30 PM Medical Record Number: [id number] Patient Account Number: [id number] Date of Birth/Sex: [birth year] (83 y.o. Female) Treating RN: Ashok Cordia, Debi Primary Care Annica Marinello: BABAOFF, MARCUS Other Clinician: Referring Janel Beane: BABAOFF, MARCUS Treating Eimy Plaza/Extender: Linwood Dibbles, HOYT Weeks in Treatment: 0 Visit Information Patient Arrived: Wheel Chair Arrival Time: 12:47 Accompanied By: son, caregiver Transfer Assistance: Other Patient Identification Verified: Yes Secondary Verification Process Completed: Yes Patient Requires Transmission-Based No Precautions: Patient Has Alerts: No Electronic Signature(s) Signed: 07/01/2017 3:57:11 PM By: Alejandro Mulling Entered By: Alejandro Mulling on 07/01/2017 12:48:37 [name] ([id number]) -------------------------------------------------------------------------------- Clinic Level of Care Assessment Details Patient Name: [name] Date of Service: 07/01/2017 12:30 PM Medical Record Number: [id number] Patient Account Number:  [id number] Date of Birth/Sex: [birth year] (83 y.o. Female) Treating RN: Ashok Cordia, Debi Primary Care Veda Arrellano: BABAOFF, MARCUS Other Clinician: Referring Kenyata Napier: BABAOFF, MARCUS Treating Jasalyn Frysinger/Extender: STONE III, HOYT Weeks in Treatment: 0 Clinic Level of Care Assessment Items TOOL 2 Quantity Score X - Use when only an EandM is performed on the INITIAL visit 1 0 ASSESSMENTS - Nursing Assessment / Reassessment X - General Physical Exam (combine w/ comprehensive assessment (listed just below) when 1 20 performed on new pt. evals) X- 1 25 Comprehensive Assessment (HX, ROS, Risk Assessments, Wounds Hx, etc.) ASSESSMENTS - Wound and Skin Assessment / Reassessment X - Simple Wound Assessment / Reassessment - one wound 1 5 []  - 0 Complex Wound Assessment / Reassessment - multiple wounds []  - 0 Dermatologic / Skin Assessment (not related to wound area) ASSESSMENTS - Ostomy and/or Continence Assessment and Care []  - Incontinence Assessment and Management 0 []  - 0 Ostomy Care Assessment and Management (repouching, etc.) PROCESS - Coordination of Care []  - Simple Patient / Family Education for ongoing care 0 X- 1 20 Complex (extensive) Patient / Family Education for ongoing care X- 1 10 Staff obtains Chiropractor, Records, Test Results / Process Orders X- 1 10 Staff telephones HHA, Nursing Homes / Clarify orders / etc []  - 0 Routine Transfer to another Facility (non-emergent condition) []  - 0 Routine Hospital Admission (non-emergent condition) []  - 0 New Admissions / Manufacturing engineer / Ordering NPWT, Apligraf, etc. []  - 0 Emergency Hospital Admission (emergent condition) X- 1 10 Simple Discharge Coordination []  - 0 Complex (extensive) Discharge Coordination PROCESS - Special Needs []  - Pediatric / Minor Patient Management 0 []  - 0 Isolation Patient Management DALILA, KESNER ([id number]) []  - 0 Hearing / Language / Visual special needs []  - 0 Assessment of Community  assistance (transportation, D/C planning, etc.) []  - 0 Additional assistance / Altered mentation []  - 0 Support Surface(s) Assessment (bed, cushion, seat, etc.) INTERVENTIONS - Wound Cleansing / Measurement X - Wound Imaging (photographs - any number of wounds) 1 5 []  - 0 Wound Tracing (instead of photographs) X- 1 5 Simple Wound Measurement - one wound []  - 0 Complex Wound Measurement -  multiple wounds X- 1 5 Simple Wound Cleansing - one wound []  - 0 Complex Wound Cleansing - multiple wounds INTERVENTIONS - Wound Dressings X - Small Wound Dressing one or multiple wounds 1 10 []  - 0 Medium Wound Dressing one or multiple wounds []  - 0 Large Wound Dressing one or multiple wounds []  - 0 Application of Medications - injection INTERVENTIONS - Miscellaneous []  - External ear exam 0 []  - 0 Specimen Collection (cultures, biopsies, blood, body fluids, etc.) []  - 0 Specimen(s) / Culture(s) sent or taken to Lab for analysis X- 1 10 Patient Transfer (multiple staff / Michiel Sites Lift / Similar devices) []  - 0 Simple Staple / Suture removal (25 or less) []  - 0 Complex Staple / Suture removal (26 or more) []  - 0 Hypo / Hyperglycemic Management (close monitor of Blood Glucose) []  - 0 Ankle / Brachial Index (ABI) - do not check if billed separately Has the patient been seen at the hospital within the last three years: Yes Total Score: 135 Level Of Care: New/Established - Level 4 Electronic Signature(s) Signed: 07/01/2017 3:57:11 PM By: Alejandro Mulling Entered By: Alejandro Mulling on 07/01/2017 14:54:44 [name] ([id number]) -------------------------------------------------------------------------------- Encounter Discharge Information Details Patient Name: [name] Date of Service: 07/01/2017 12:30 PM Medical Record Number: [id number] Patient Account Number: [id number] Date of Birth/Sex: 07/03/1925 (83 y.o. Female) Treating RN: Ashok Cordia, Debi Primary Care Alexios Keown:  BABAOFF, MARCUS Other Clinician: Referring Samauri Kellenberger: BABAOFF, MARCUS Treating Acasia Skilton/Extender: STONE III, HOYT Weeks in Treatment: 0 Encounter Discharge Information Items Discharge Pain Level: 0 Discharge Condition: Stable Ambulatory Status: Wheelchair Discharge Destination: Home Transportation: Private Auto Accompanied By: caregiver Schedule Follow-up Appointment: Yes Medication Reconciliation completed and No provided to Patient/Care Jeanluc Wegman: Provided on Clinical Summary of Care: 07/01/2017 Form Type Recipient Paper Patient RZ Electronic Signature(s) Signed: 07/04/2017 11:56:58 AM By: Gwenlyn Perking Entered By: Gwenlyn Perking on 07/01/2017 13:52:16 [name] ([id number]) -------------------------------------------------------------------------------- Lower Extremity Assessment Details Patient Name: [name] Date of Service: 07/01/2017 12:30 PM Medical Record Number: [id number] Patient Account Number: [id number] Date of Birth/Sex: August 08, 1925 (83 y.o. Female) Treating RN: Ashok Cordia, Debi Primary Care Tonea Leiphart: BABAOFF, MARCUS Other Clinician: Referring Perris Conwell: BABAOFF, MARCUS Treating Eh Sesay/Extender: STONE III, HOYT Weeks in Treatment: 0 Electronic Signature(s) Signed: 07/01/2017 3:57:11 PM By: Alejandro Mulling Entered By: Alejandro Mulling on 07/01/2017 13:02:14 [name] ([id number]) -------------------------------------------------------------------------------- Multi Wound Chart Details Patient Name: [name] Date of Service: 07/01/2017 12:30 PM Medical Record Number: [id number] Patient Account Number: [id number] Date of Birth/Sex: 1925/06/11 (83 y.o. Female) Treating RN: Ashok Cordia, Debi Primary Care Tritia Endo: BABAOFF, MARCUS Other Clinician: Referring Alister Staver: BABAOFF, MARCUS Treating Yamil Dougher/Extender: STONE III, HOYT Weeks in Treatment: 0 Vital Signs Height(in): 68 Pulse(bpm): 71 Weight(lbs): 108 Blood Pressure(mmHg):  104/71 Body Mass Index(BMI): 16 Temperature(F): 98.0 Respiratory Rate 16 (breaths/min): Photos: [1:No Photos] [N/A:N/A] Wound Location: [1:Sacrum] [N/A:N/A] Wounding Event: [1:Pressure Injury] [N/A:N/A] Primary Etiology: [1:Pressure Ulcer] [N/A:N/A] Date Acquired: [1:05/01/2017] [N/A:N/A] Weeks of Treatment: [1:0] [N/A:N/A] Wound Status: [1:Open] [N/A:N/A] Measurements L x W x D [1:6.2x3.4x0.4] [N/A:N/A] (cm) Area (cm) : [1:16.556] [N/A:N/A] Volume (cm) : [1:6.622] [N/A:N/A] Classification: [1:Category/Stage III] [N/A:N/A] Exudate Amount: [1:Large] [N/A:N/A] Exudate Type: [1:Serous] [N/A:N/A] Exudate Color: [1:amber] [N/A:N/A] Wound Margin: [1:Distinct, outline attached] [N/A:N/A] Granulation Amount: [1:None Present (0%)] [N/A:N/A] Necrotic Amount: [1:Large (67-100%)] [N/A:N/A] Exposed Structures: [1:Fat Layer (Subcutaneous Tissue) Exposed: Yes Fascia: No Tendon: No Muscle: No Joint: No Bone: No] [N/A:N/A] Epithelialization: [1:None] [N/A:N/A] Periwound Skin Texture: [1:No Abnormalities Noted] [N/A:N/A] Periwound Skin Moisture: [1:Maceration: Yes] [N/A:N/A] Periwound Skin Color: [  1:Erythema: Yes] [N/A:N/A] Erythema Location: [1:Circumferential] [N/A:N/A] Tenderness on Palpation: [1:No] [N/A:N/A] Wound Preparation: [1:Ulcer Cleansing: Rinsed/Irrigated with Saline] [N/A:N/A] Topical Anesthetic Applied: Other: lidocaine 4% Josephina ShihZACHARY, Marcena H. ([id number]) Treatment Notes Electronic Signature(s) Signed: 07/01/2017 1:20:40 PM By: Alejandro MullingPinkerton, Debra Previous Signature: 07/01/2017 1:17:50 PM Version By: Alejandro MullingPinkerton, Debra Entered By: Alejandro MullingPinkerton, Debra on 07/01/2017 13:20:40 Josephina ShihZACHARY, Arrabella H. ([id number]) -------------------------------------------------------------------------------- Multi-Disciplinary Care Plan Details Patient Name: Josephina ShihZACHARY, Rayvin H. Date of Service: 07/01/2017 12:30 PM Medical Record Number: [id number] Patient Account Number: [id number] Date of Birth/Sex:  09/03/1925 (83 y.o. Female) Treating RN: Ashok CordiaPinkerton, Debi Primary Care Breckyn Ticas: BABAOFF, MARCUS Other Clinician: Referring Shetara Launer: BABAOFF, MARCUS Treating Nolene Rocks/Extender: STONE III, HOYT Weeks in Treatment: 0 Active Inactive ` Abuse / Safety / Falls / Self Care Management Nursing Diagnoses: History of Falls Potential for falls Goals: Patient will not experience any injury related to falls Date Initiated: 07/01/2017 Target Resolution Date: 10/15/2017 Goal Status: Active Patient will remain injury free related to falls Date Initiated: 07/01/2017 Target Resolution Date: 10/15/2017 Goal Status: Active Interventions: Assess fall risk on admission and as needed Assess: immobility, friction, shearing, incontinence upon admission and as needed Assess impairment of mobility on admission and as needed per policy Assess self care needs on admission and as needed Notes: ` Nutrition Nursing Diagnoses: Imbalanced nutrition Potential for alteratiion in Nutrition/Potential for imbalanced nutrition Goals: Patient/caregiver agrees to and verbalizes understanding of need to use nutritional supplements and/or vitamins as prescribed Date Initiated: 07/01/2017 Target Resolution Date: 10/15/2017 Goal Status: Active Interventions: Assess patient nutrition upon admission and as needed per policy Notes: ` Orientation to the Wound Care Program [place] Center[place], Galina H. ([id number]) Nursing Diagnoses: Knowledge deficit related to the wound healing center program Goals: Patient/caregiver will verbalize understanding of the Wound Healing Center Program Date Initiated: 07/01/2017 Target Resolution Date: 07/16/2017 Goal Status: Active Interventions: Provide education on orientation to the wound center Notes: ` Pain, Acute or Chronic Nursing Diagnoses: Pain, acute or chronic: actual or potential Potential alteration in comfort, pain Goals: Patient/caregiver will verbalize adequate pain control between  visits Date Initiated: 07/01/2017 Target Resolution Date: 09/30/2017 Goal Status: Active Interventions: Complete pain assessment as per visit requirements Reposition patient for comfort Notes: ` Pressure Nursing Diagnoses: Knowledge deficit related to causes and risk factors for pressure ulcer development Knowledge deficit related to management of pressures ulcers Potential for impaired tissue integrity related to pressure, friction, moisture, and shear Goals: Patient will remain free from development of additional pressure ulcers Date Initiated: 07/01/2017 Target Resolution Date: 10/15/2017 Goal Status: Active Interventions: Assess: immobility, friction, shearing, incontinence upon admission and as needed Assess offloading mechanisms upon admission and as needed Assess potential for pressure ulcer upon admission and as needed Provide education on pressure ulcers Notes: [name]` Crocket, Walterine H. ([id number]) Wound/Skin Impairment Nursing Diagnoses: Impaired tissue integrity Knowledge deficit related to ulceration/compromised skin integrity Goals: Ulcer/skin breakdown will have a volume reduction of 80% by week 12 Date Initiated: 07/01/2017 Target Resolution Date: 10/15/2017 Goal Status: Active Interventions: Assess patient/caregiver ability to perform ulcer/skin care regimen upon admission and as needed Assess ulceration(s) every visit Notes: Electronic Signature(s) Signed: 07/01/2017 1:17:23 PM By: Alejandro MullingPinkerton, Debra Entered By: Alejandro MullingPinkerton, Debra on 07/01/2017 13:17:21 Josephina ShihZACHARY, Jani H. ([id number]) -------------------------------------------------------------------------------- Pain Assessment Details Patient Name: Josephina ShihZACHARY, Tramaine H. Date of Service: 07/01/2017 12:30 PM Medical Record Number: [id number] Patient Account Number: [id number] Date of Birth/Sex: 05/02/1925 (83 y.o. Female) Treating RN: Ashok CordiaPinkerton, Debi Primary Care Halea Lieb: BABAOFF, MARCUS Other Clinician: Referring Nadeen Shipman:  BABAOFF, MARCUS Treating Maili Shutters/Extender: STONE III, HOYT Weeks in Treatment:  0 Active Problems Location of Pain Severity and Description of Pain Patient Has Paino No Site Locations Pain Management and Medication Current Pain Management: Electronic Signature(s) Signed: 07/01/2017 3:57:11 PM By: Alejandro Mulling Entered By: Alejandro Mulling on 07/01/2017 12:48:53 [name] ([id number]) -------------------------------------------------------------------------------- Patient/Caregiver Education Details Patient Name: [name] Date of Service: 07/01/2017 12:30 PM Medical Record Number: [id number] Patient Account Number: [id number] Date of Birth/Gender: Dec 10, 1924 (83 y.o. Female) Treating RN: Ashok Cordia, Debi Primary Care Physician: BABAOFF, MARCUS Other Clinician: Referring Physician: BABAOFF, MARCUS Treating Physician/Extender: Linwood Dibbles, HOYT Weeks in Treatment: 0 Education Assessment Education Provided To: Caregiver Education Topics Provided Nutrition: Handouts: Nutrition Methods: Explain/Verbal Responses: State content correctly Offloading: Handouts: What is Offloadingo Methods: Demonstration Responses: State content correctly Pressure: Handouts: Pressure Ulcers: Care and Offloading Methods: Explain/Verbal Responses: State content correctly Welcome To The [facility]: Handouts: Welcome To The [facility] Methods: Explain/Verbal Responses: State content correctly Wound/Skin Impairment: Handouts: Other: change dressing as ordered Methods: Demonstration, Explain/Verbal Responses: State content correctly Electronic Signature(s) Signed: 07/01/2017 3:57:11 PM By: Alejandro Mulling Entered By: Alejandro Mulling on 07/01/2017 13:51:12 [name] ([id number]) -------------------------------------------------------------------------------- Wound Assessment Details Patient Name: [name]. Date of Service: 07/01/2017 12:30 PM Medical  Record Number: [id number] Patient Account Number: [id number] Date of Birth/Sex: May 02, 1925 (83 y.o. Female) Treating RN: Ashok Cordia, Debi Primary Care Flor Houdeshell: BABAOFF, MARCUS Other Clinician: Referring Elijahjames Fuelling: BABAOFF, MARCUS Treating Moon Budde/Extender: STONE III, HOYT Weeks in Treatment: 0 Wound Status Wound Number: 1 Primary Etiology: Pressure Ulcer Wound Location: Sacrum Wound Status: Open Wounding Event: Pressure Injury Date Acquired: 05/01/2017 Weeks Of Treatment: 0 Clustered Wound: No Photos Photo Uploaded By: Alejandro Mulling on 07/01/2017 16:10:19 Wound Measurements Length: (cm) 6.2 Width: (cm) 3.4 Depth: (cm) 0.4 Area: (cm) 16.556 Volume: (cm) 6.622 % Reduction in Area: % Reduction in Volume: Epithelialization: None Tunneling: No Undermining: No Wound Description Classification: Category/Stage III Wound Margin: Distinct, outline attached Exudate Amount: Large Exudate Type: Serous Exudate Color: amber Foul Odor After Cleansing: No Slough/Fibrino Yes Wound Bed Granulation Amount: None Present (0%) Exposed Structure Necrotic Amount: Large (67-100%) Fascia Exposed: No Necrotic Quality: Adherent Slough Fat Layer (Subcutaneous Tissue) Exposed: Yes Tendon Exposed: No Muscle Exposed: No Joint Exposed: No Bone Exposed: No Periwound Skin Texture JAZARIAH, TEALL. ([id number]) Texture Color No Abnormalities Noted: No No Abnormalities Noted: No Erythema: Yes Moisture Erythema Location: Circumferential No Abnormalities Noted: No Maceration: Yes Wound Preparation Ulcer Cleansing: Rinsed/Irrigated with Saline Topical Anesthetic Applied: Other: lidocaine 4%, Treatment Notes Wound #1 (Sacrum) 1. Cleansed with: Clean wound with Normal Saline 2. Anesthetic Topical Lidocaine 4% cream to wound bed prior to debridement 3. Peri-wound Care: Skin Prep 4. Dressing Applied: Medihoney Gel 5. Secondary Dressing Applied Bordered Foam Dressing Dry Gauze Electronic  Signature(s) Signed: 07/01/2017 3:57:11 PM By: Alejandro Mulling Entered By: Alejandro Mulling on 07/01/2017 13:10:30 [name] ([id number]) -------------------------------------------------------------------------------- Vitals Details Patient Name: [name] Date of Service: 07/01/2017 12:30 PM Medical Record Number: [id number] Patient Account Number: [id number] Date of Birth/Sex: 08-17-1925 (83 y.o. Female) Treating RN: Ashok Cordia, Debi Primary Care Luccia Reinheimer: BABAOFF, MARCUS Other Clinician: Referring Nelson Noone: BABAOFF, MARCUS Treating Corneluis Allston/Extender: STONE III, HOYT Weeks in Treatment: 0 Vital Signs Time Taken: 12:48 Temperature (F): 98.0 Height (in): 68 Pulse (bpm): 71 Source: Stated Respiratory Rate (breaths/min): 16 Weight (lbs): 108 Blood Pressure (mmHg): 104/71 Source: Stated Reference Range: 80 - 120 mg / dl Body Mass Index (BMI): 16.4 Electronic Signature(s) Signed: 07/01/2017 3:57:11 PM By: Alejandro Mulling Entered By: Alejandro Mulling on 07/01/2017 12:51:40

## 2017-07-04 NOTE — Progress Notes (Signed)
Theresa Frank, Theresa Frank ([id number]) Visit Report for 07/01/2017 Abuse/Suicide Risk Screen Details Patient Name: Theresa Frank, Theresa Frank. Date of Service: 07/01/2017 12:30 PM Medical Record Number: [id number] Patient Account Number: [id number] Date of Birth/Sex: Feb 06, 1925 (83 y.o. Female) Treating RN: Ashok Cordia, Debi Primary Care Teasha Murrillo: BABAOFF, MARCUS Other Clinician: Referring Evelynn Hench: BABAOFF, MARCUS Treating Tanisha Lutes/Extender: STONE III, HOYT Weeks in Treatment: 0 Abuse/Suicide Risk Screen Items Answer Notes pt unable to answer d/t mental condition Electronic Signature(s) Signed: 07/01/2017 3:57:11 PM By: Alejandro Mulling Entered By: Alejandro Mulling on 07/01/2017 12:59:32 Theresa Frank ([id number]) -------------------------------------------------------------------------------- Activities of Daily Living Details Patient Name: Theresa Frank Date of Service: 07/01/2017 12:30 PM Medical Record Number: [id number] Patient Account Number: [id number] Date of Birth/Sex: 05-16-1925 (83 y.o. Female) Treating RN: Ashok Cordia, Debi Primary Care Onyinyechi Huante: BABAOFF, MARCUS Other Clinician: Referring Thimothy Barretta: BABAOFF, MARCUS Treating Dani Wallner/Extender: STONE III, HOYT Weeks in Treatment: 0 Activities of Daily Living Items Answer Activities of Daily Living (Please select one for each item) Drive Automobile Not Able Take Medications Not Able Use Telephone Not Able Care for Appearance Not Able Use Toilet Not Able Bath / Shower Not Able Dress Self Not Able Feed Self Not Able Walk Not Able Get In / Out Bed Not Able Housework Not Able Prepare Meals Not Able Handle Money Not Able Shop for Self Not Able Electronic Signature(s) Signed: 07/01/2017 3:57:11 PM By: Alejandro Mulling Entered By: Alejandro Mulling on 07/01/2017 12:59:56 Theresa Frank ([id number]) -------------------------------------------------------------------------------- Education Assessment Details Patient Name: Theresa Frank Date of Service: 07/01/2017 12:30 PM Medical Record Number: [id number] Patient Account Number: [id number] Date of Birth/Sex: 03-19-1925 (83 y.o. Female) Treating RN: Ashok Cordia, Debi Primary Care Kenzi Bardwell: BABAOFF, MARCUS Other Clinician: Referring Koben Daman: BABAOFF, MARCUS Treating Artrell Lawless/Extender: STONE III, HOYT Weeks in Treatment: 0 Primary Learner Assessed: Caregiver Reason Patient is not Primary Learner: mental condition Learning Preferences/Education Level/Primary Language Learning Preference: Explanation, Printed Material Highest Education Level: College or Above Preferred Language: English Cognitive Barrier Assessment/Beliefs Language Barrier: No Translator Needed: No Memory Deficit: No Emotional Barrier: No Cultural/Religious Beliefs Affecting Medical Care: No Physical Barrier Assessment Impaired Vision: Yes Glasses Impaired Hearing: No Decreased Hand dexterity: No Knowledge/Comprehension Assessment Knowledge Level: Medium Comprehension Level: Medium Ability to understand written Medium instructions: Ability to understand verbal Medium instructions: Motivation Assessment Anxiety Level: Calm Cooperation: Cooperative Education Importance: Acknowledges Need Interest in Health Problems: Asks Questions Perception: Coherent Willingness to Engage in Self- Medium Management Activities: Readiness to Engage in Self- Medium Management Activities: Electronic Signature(s) Signed: 07/01/2017 3:57:11 PM By: Alejandro Mulling Entered By: Alejandro Mulling on 07/01/2017 13:00:39 Theresa Frank ([id number]) -------------------------------------------------------------------------------- Fall Risk Assessment Details Patient Name: Theresa Frank Date of Service: 07/01/2017 12:30 PM Medical Record Number: [id number] Patient Account Number: [id number] Date of Birth/Sex: [birth year] (83 y.o. Female) Treating RN: Ashok Cordia, Debi Primary Care Elijiah Mickley: BABAOFF, MARCUS Other  Clinician: Referring Edona Schreffler: BABAOFF, MARCUS Treating Alexius Hangartner/Extender: STONE III, HOYT Weeks in Treatment: 0 Fall Risk Assessment Items Have you had 2 or more falls in the last 12 monthso 0 Yes Have you had any fall that resulted in injury in the last 12 monthso 0 Yes FALL RISK ASSESSMENT: History of falling - immediate or within 3 months 0 No Secondary diagnosis 15 Yes Ambulatory aid None/bed rest/wheelchair/nurse 0 Yes Crutches/cane/walker 15 Yes Furniture 0 No IV Access/Saline Lock 0 No Gait/Training Normal/bed rest/immobile 0 No Weak 10 Yes Impaired 20 Yes Mental Status Oriented to own ability 0 No Electronic Signature(s) Signed: 07/01/2017 3:57:11 PM By: Alejandro Mulling Entered By: Alejandro Mulling on  07/01/2017 13:01:20 Theresa Frank, Theresa H. ([id number]) -------------------------------------------------------------------------------- Foot Assessment Details Patient Name: Theresa Frank, Theresa H. Date of Service: 07/01/2017 12:30 PM Medical Record Number: [id number] Patient Account Number: [id number] Date of Birth/Sex: 10/22/1924 (83 y.o. Female) Treating RN: Ashok CordiaPinkerton, Debi Primary Care Janvi Ammar: BABAOFF, MARCUS Other Clinician: Referring Wenona [place]: BABAOFF, MARCUS Treating Pauletta Pickney/Extender: STONE III, HOYT Weeks in Treatment: 0 Foot Assessment Items Site Locations + = Sensation present, - = Sensation absent, C = Callus, U = Ulcer R = Redness, W = Warmth, M = Maceration, PU = Pre-ulcerative lesion F = Fissure, S = Swelling, D = Dryness Assessment Right: Left: Other Deformity: No No Prior Foot Ulcer: No No Prior Amputation: No No Charcot Joint: No No Ambulatory Status: Gait: Electronic Signature(s) Signed: 07/01/2017 3:57:11 PM By: Alejandro MullingPinkerton, Debra Entered By: Alejandro MullingPinkerton, Debra on 07/01/2017 13:02:00 Theresa Frank, Theresa H. ([id number]) -------------------------------------------------------------------------------- Nutrition Risk Assessment Details Patient Name: Theresa Frank, Theresa  H. Date of Service: 07/01/2017 12:30 PM Medical Record Number: [id number] Patient Account Number: [id number] Date of Birth/Sex: 09/14/1924 (83 y.o. Female) Treating RN: Ashok CordiaPinkerton, Debi Primary Care Roe Wilner: BABAOFF, MARCUS Other Clinician: Referring Jahnae Mcadoo: BABAOFF, MARCUS Treating Daquan Crapps/Extender: STONE III, HOYT Weeks in Treatment: 0 Height (in): 68 Weight (lbs): 108 Body Mass Index (BMI): 16.4 Nutrition Risk Assessment Items NUTRITION RISK SCREEN: I have an illness or condition that made me change the kind and/or amount of 2 Yes food I eat I eat fewer than two meals per day 0 No I eat few fruits and vegetables, or milk products 0 No I have three or more drinks of beer, liquor or wine almost every day 0 No I have tooth or mouth problems that make it hard for me to eat 0 No I don't always have enough money to buy the food I need 0 No I eat alone most of the time 0 No I take three or more different prescribed or over-the-counter drugs a day 1 Yes Without wanting to, I have lost or gained 10 pounds in the last six months 2 Yes I am not always physically able to shop, cook and/or feed myself 0 No Nutrition Protocols Good Risk Protocol Moderate Risk Protocol Electronic Signature(s) Signed: 07/01/2017 3:57:11 PM By: Alejandro MullingPinkerton, Debra Entered By: Alejandro MullingPinkerton, Debra on 07/01/2017 13:01:49

## 2017-07-04 NOTE — Progress Notes (Signed)
QUANIKA, SOLEM ([id number]) Visit Report for 07/01/2017 Chief Complaint Document Details Patient Name: Theresa Frank, THALMAN. Date of Service: 07/01/2017 12:30 PM Medical Record Number: [id number] Patient Account Number: [id number] Date of Birth/Sex: Jan 10, 1925 (83 y.o. Female) Treating RN: Huel Coventry Primary Care Provider: Kandyce Rud Other Clinician: Referring Provider: Kandyce Rud Treating Provider/Extender: Linwood Dibbles, HOYT Weeks in Treatment: 0 Information Obtained from: Patient Chief Complaint Left Gluteal Ulcer Electronic Signature(s) Signed: 07/01/2017 5:05:22 PM By: Lenda Kelp PA-C Entered By: Lenda Kelp on 07/01/2017 16:53:43 Theresa Frank ([id number]) -------------------------------------------------------------------------------- HPI Details Patient Name: Theresa Frank Date of Service: 07/01/2017 12:30 PM Medical Record Number: [id number] Patient Account Number: [id number] Date of Birth/Sex: [birth year] (83 y.o. Female) Treating RN: Huel Coventry Primary Care Provider: Kandyce Rud Other Clinician: Referring Provider: BABAOFF, MARCUS Treating Provider/Extender: Linwood Dibbles, HOYT Weeks in Treatment: 0 History of Present Illness HPI Description: 07/01/17 on evaluation today patient appears to be doing overall okay although she is on hospice and is very malnourished appearing. She eats some and her caregivers do try to mix in peanut butter with some of her food items and work on other ways of supplementing her nutrition with proteins such as ensure but she really does not eat a sufficient amount in order to heal this wound that she is currently dealing with. She has been diagnosed with adult failure to thrive. She also has Alzheimer's disease. She therefore is very difficult to reason with. Currently she is on hospice and under their care upon reviewing the note it does appear that it was stated that the providers who saw her on 06/21/17 was going to place her on  antibiotics but I'm unsure of what antibiotic she ended up being on. This was also when hospice was recommended to be involved in her care. Patient is wheelchair-bound and is prone to frequent falling. Secondary to mental status she is unable to rate or describe any discomfort she is having although with cleansing the wound she did seem to react in a way that would tell me she is having some pain at least. Electronic Signature(s) Signed: 07/01/2017 5:05:22 PM By: Lenda Kelp PA-C Entered By: Lenda Kelp on 07/01/2017 17:01:11 Theresa Frank ([id number]) -------------------------------------------------------------------------------- Physical Exam Details Patient Name: Theresa Frank Date of Service: 07/01/2017 12:30 PM Medical Record Number: [id number] Patient Account Number: [id number] Date of Birth/Sex: [birth year] (83 y.o. Female) Treating RN: Huel Coventry Primary Care Provider: Kandyce Rud Other Clinician: Referring Provider: BABAOFF, MARCUS Treating Provider/Extender: STONE III, HOYT Weeks in Treatment: 0 Constitutional supine blood pressure is within target range for patient.. pulse regular and within target range for patient.Marland Kitchen respirations regular, non-labored and within target range for patient.Marland Kitchen temperature within target range for patient.. cachectic. Chronically ill appearing but in no apparent acute distress. Eyes conjunctiva clear no eyelid edema noted. pupils equal round and reactive to light and accommodation. Ears, Nose, Mouth, and Throat no gross abnormality of ear auricles or external auditory canals. normal hearing noted during conversation. mucus membranes moist. Respiratory normal breathing without difficulty. clear to auscultation bilaterally. Cardiovascular regular rate and rhythm with normal S1, S2. no clubbing, cyanosis, significant edema, <3 sec cap refill. Gastrointestinal (GI) soft, non-tender, non-distended, +BS. no ventral hernia  noted. Musculoskeletal Patient unable to walk without assistance. Psychiatric Patient is not able to cooperate in decision making regarding care. Patient is oriented to person only. patient is confused. Notes No debridement was performed today as patient as obviously under hospice care and that would  fall outside of the scope in this case in my opinion for her treatment. Electronic Signature(s) Signed: 07/01/2017 5:05:22 PM By: Lenda Kelp PA-C Entered By: Lenda Kelp on 07/01/2017 17:02:18 Theresa Frank ([id number]) -------------------------------------------------------------------------------- Physician Orders Details Patient Name: Theresa Frank Date of Service: 07/01/2017 12:30 PM Medical Record Number: [id number] Patient Account Number: [id number] Date of Birth/Sex: Oct 12, 1924 (83 y.o. Female) Treating RN: Ashok Cordia, Debi Primary Care Provider: BABAOFF, MARCUS Other Clinician: Referring Provider: BABAOFF, MARCUS Treating Provider/Extender: STONE III, HOYT Weeks in Treatment: 0 Verbal / Phone Orders: Yes Clinician: Pinkerton, Debi Read Back and Verified: Yes Diagnosis Coding ICD-10 Coding Code Description L89.323 Pressure ulcer of left buttock, stage 3 E44.0 Moderate protein-calorie malnutrition R62.7 Adult failure to thrive I10 Essential (primary) hypertension Wound Cleansing Wound #1 Sacrum o Clean wound with Normal Saline. o Cleanse wound with mild soap and water o May Shower, gently pat wound dry prior to applying new dressing. Anesthetic Wound #1 Sacrum o Topical Lidocaine 4% cream applied to wound bed prior to debridement Skin Barriers/Peri-Wound Care Wound #1 Sacrum o Skin Prep Primary Wound Dressing Wound #1 Sacrum o Medihoney gel - or manuka honey Secondary Dressing Wound #1 Sacrum o Dry Gauze o Boardered Foam Dressing Dressing Change Frequency Wound #1 Sacrum o Change dressing every other day. o Other: - as  needed Follow-up Appointments Wound #1 Sacrum o Return Appointment in 1 week. Off-Loading DEMETRISS, KO ([id number]) Wound #1 Sacrum o Turn and reposition every 2 hours Additional Orders / Instructions Wound #1 Sacrum o Increase protein intake. Home Health Wound #1 Sacrum o Continue Home Health Visits o Home Health Nurse may visit PRN to address patientos wound care needs. o FACE TO FACE ENCOUNTER: MEDICARE and MEDICAID PATIENTS: I certify that this patient is under my care and that I had a face-to-face encounter that meets the physician face-to-face encounter requirements with this patient on this date. The encounter with the patient was in whole or in part for the following MEDICAL CONDITION: (primary reason for Home Healthcare) MEDICAL NECESSITY: I certify, that based on my findings, NURSING services are a medically necessary home health service. HOME BOUND STATUS: I certify that my clinical findings support that this patient is homebound (i.e., Due to illness or injury, pt requires aid of supportive devices such as crutches, cane, wheelchairs, walkers, the use of special transportation or the assistance of another person to leave their place of residence. There is a normal inability to leave the home and doing so requires considerable and taxing effort. Other absences are for medical reasons / religious services and are infrequent or of short duration when for other reasons). o If current dressing causes regression in wound condition, may D/C ordered dressing product/s and apply Normal Saline Moist Dressing daily until next Wound Healing Center / Other MD appointment. Notify Wound Healing Center of regression in wound condition at [phone]. o Please direct any NON-WOUND related issues/requests for orders to patient's Primary Care Physician Medications-please add to medication list. Wound #1 Sacrum o Other: - Vitamin C, Zinc, MVI Electronic  Signature(s) Signed: 07/01/2017 5:05:22 PM By: Lenda Kelp PA-C Previous Signature: 07/01/2017 3:57:11 PM Version By: Alejandro Mulling Entered By: Lenda Kelp on 07/01/2017 17:02:36 Theresa Frank ([id number]) -------------------------------------------------------------------------------- Problem List Details Patient Name: Theresa Frank Date of Service: 07/01/2017 12:30 PM Medical Record Number: [id number] Patient Account Number: [id number] Date of Birth/Sex: 12-19-1924 (83 y.o. 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, stage 3 07/01/2017 Yes E44.0 Moderate protein-calorie malnutrition 07/01/2017 Yes R62.7 Adult failure to thrive 07/01/2017 Yes I10 Essential (primary) hypertension 07/01/2017 Yes Inactive Problems Resolved Problems Electronic Signature(s) Signed: 07/01/2017 5:05:22 PM By: Lenda Kelp PA-C Entered By: Lenda Kelp on 07/01/2017 16:53:24 Theresa Frank ([id number]) -------------------------------------------------------------------------------- Progress Note Details Patient Name: Theresa Frank Date of Service: 07/01/2017 12:30 PM Medical Record Number: [id number] Patient Account Number: [id number] Date of Birth/Sex: 10-Mar-1925 (83 y.o. Female) Treating RN: Huel Coventry Primary Care Provider: Kandyce Rud Other Clinician: Referring Provider: BABAOFF, MARCUS Treating Provider/Extender: Linwood Dibbles, HOYT Weeks in Treatment: 0 Subjective Chief Complaint Information obtained from Patient Left Gluteal Ulcer History of Present Illness (HPI) 07/01/17 on evaluation today patient appears to be doing overall okay although she is on hospice and is very malnourished appearing. She eats some and  her caregivers do try to mix in peanut butter with some of her food items and work on other ways of supplementing her nutrition with proteins such as ensure but she really does not eat a sufficient amount in order to heal this wound that she is currently dealing with. She has been diagnosed with adult failure to thrive. She also has Alzheimer's disease. She therefore is very difficult to reason with. Currently she is on hospice and under their care upon reviewing the note it does appear that it was stated that the providers who saw her on 06/21/17 was going to place her on antibiotics but I'm unsure of what antibiotic she ended up being on. This was also when hospice was recommended to be involved in her care. Patient is wheelchair-bound and is prone to frequent falling. Secondary to mental status she is unable to rate or describe any discomfort she is having although with cleansing the wound she did seem to react in a way that would tell me she is having some pain at least. Wound History Patient presents with 1 open wound that has been present for approximately 2 months. Patient has been treating wound in the following manner: bandage. Laboratory tests have not been performed in the last month. Patient reportedly has not tested positive for an antibiotic resistant organism. Patient reportedly has not tested positive for osteomyelitis. Patient History Unable to Obtain Patient History due to Dementia. Information obtained from Patient. Allergies Zocor, cefuroxime axetil, Ciproflaxin (Reaction: itching), Penicilin, Premarin, Septra Family History Cancer - Mother, Heart Disease - Child, No family history of Diabetes, Hereditary Spherocytosis, Hypertension, Kidney Disease, Lung Disease, Seizures, Stroke, Thyroid Problems, Tuberculosis. Social History Never smoker, Marital Status - Married, Alcohol Use - Never, Drug Use - No History, Caffeine Use - Never. Review of Systems (ROS) Constitutional  Symptoms (General Health) The patient has no complaints or symptoms. CLARITY, CISZEK ([id number]) Objective Constitutional supine blood pressure is within target range for patient.. pulse regular and within target range for patient.Marland Kitchen respirations regular, non-labored and within target range for patient.Marland Kitchen temperature within target range for patient.. cachectic. Chronically ill appearing but in no apparent acute distress. Vitals Time Taken: 12:48 PM, Height: 68 in, Source: Stated, Weight: 108 lbs, Source: Stated, BMI: 16.4, Temperature: 98.0  F, Pulse: 71 bpm, Respiratory Rate: 16 breaths/min, Blood Pressure: 104/71 mmHg. Eyes conjunctiva clear no eyelid edema noted. pupils equal round and reactive to light and accommodation. Ears, Nose, Mouth, and Throat no gross abnormality of ear auricles  or external auditory canals. normal hearing noted during conversation. mucus membranes moist. Respiratory normal breathing without difficulty. clear to auscultation bilaterally. Cardiovascular regular rate and rhythm with normal S1, S2. no clubbing, cyanosis, significant edema, Gastrointestinal (GI) soft, non-tender, non-distended, +BS. no ventral hernia noted. Musculoskeletal Patient unable to walk without assistance. Psychiatric Patient is not able to cooperate in decision making regarding care. Patient is oriented to person only. patient is confused. General Notes: No debridement was performed today as patient as obviously under hospice care and that would fall outside of the scope in this case in my opinion for her treatment. Integumentary (Hair, Skin) Wound #1 status is Open. Original cause of wound was Pressure Injury. The wound is located on the Sacrum. The wound measures 6.2cm length x 3.4cm width x 0.4cm depth; 16.556cm^2 area and 6.622cm^3 volume. There is Fat Layer (Subcutaneous Tissue) Exposed exposed. There is no tunneling or undermining noted. There is a large amount of  serous drainage noted. The wound margin is distinct with the outline attached to the wound base. There is no granulation within the wound bed. There is a large (67-100%) amount of necrotic tissue within the wound bed including Adherent Slough. The periwound skin appearance exhibited: Maceration, Erythema. The surrounding wound skin color is noted with erythema which is circumferential. Assessment Active Problems EDEE, NIFONG. ([id number]) ICD-10 [phone] - Pressure ulcer of left buttock, stage 3 E44.0 - Moderate protein-calorie malnutrition R62.7 - Adult failure to thrive I10 - Essential (primary) hypertension Plan Wound Cleansing: Wound #1 Sacrum: Clean wound with Normal Saline. Cleanse wound with mild soap and water May Shower, gently pat wound dry prior to applying new dressing. Anesthetic: Wound #1 Sacrum: Topical Lidocaine 4% cream applied to wound bed prior to debridement Skin Barriers/Peri-Wound Care: Wound #1 Sacrum: Skin Prep Primary Wound Dressing: Wound #1 Sacrum: Medihoney gel - or manuka honey Secondary Dressing: Wound #1 Sacrum: Dry Gauze Boardered Foam Dressing Dressing Change Frequency: Wound #1 Sacrum: Change dressing every other day. Other: - as needed Follow-up Appointments: Wound #1 Sacrum: Return Appointment in 1 week. Off-Loading: Wound #1 Sacrum: Turn and reposition every 2 hours Additional Orders / Instructions: Wound #1 Sacrum: Increase protein intake. Home Health: Wound #1 Sacrum: Continue Home Health Visits Home Health Nurse may visit PRN to address patient s wound care needs. FACE TO FACE ENCOUNTER: MEDICARE and MEDICAID PATIENTS: I certify that this patient is under my care and that I had a face-to-face encounter that meets the physician face-to-face encounter requirements with this patient on this date. The encounter with the patient was in whole or in part for the following MEDICAL CONDITION: (primary reason for Home Healthcare)  MEDICAL NECESSITY: I certify, that based on my findings, NURSING services are a medically necessary home health service. HOME BOUND STATUS: I certify that my clinical findings support that this patient is homebound (i.e., Due to illness or injury, pt requires aid of supportive devices such as crutches, cane, wheelchairs, walkers, the use of special transportation or the assistance of another person to leave their place of residence. There is a normal inability to leave the home and doing so requires considerable and taxing effort. Other absences are for medical reasons / religious services and are infrequent or of short duration when for other reasons). If current dressing causes regression in wound condition, may D/C ordered dressing product/s and apply Normal Saline Moist Dressing daily until next Wound Healing Center / Other MD appointment. Notify Wound Healing Center of regression in [place], [place] H. (  [id number]) wound condition at [phone]. Please direct any NON-WOUND related issues/requests for orders to patient's Primary Care Physician Medications-please add to medication list.: Wound #1 Sacrum: Other: - Vitamin C, Zinc, MVI For the time being I'm gonna recommend treatment with Medihoney to help with auto lytic debridement. I also suggested that offloading be attempted in order to prevent any additional injury to the site which was discussed with patient's caregiver as well as ways to help supplement nutrition but to be honest they are really doing everything I would normally recommend at this point. We will see her for reevaluation in one week to see were things stand at that point. If anything worsens significantly in the interim patient's caregiver or family can contact us for additional recommendations. Otherwise we will see if some of the necrotic tissue centrally will work its way off with debridement utilizing the Medihoney. Electronic Signature(s) Signed: 07/01/2017 5:05:22 PM  By: Lenda Kelp PA-C Entered By: Lenda Kelp on 07/01/2017 17:03:47 Theresa Frank ([id number]) -------------------------------------------------------------------------------- ROS/PFSH Details Patient Name: Theresa Frank Date of Service: 07/01/2017 12:30 PM Medical Record Number: [id number] Patient Account Number: [id number] Date of Birth/Sex: 04/28/1925 (83 y.o. Female) Treating RN: Ashok Cordia, Debi Primary Care Provider: BABAOFF, MARCUS Other Clinician: Referring Provider: BABAOFF, MARCUS Treating Provider/Extender: STONE III, HOYT Weeks in Treatment: 0 Unable to Obtain Patient History due to oo Dementia Information Obtained From Patient Wound History Do you currently have one or more open woundso Yes How many open wounds do you currently haveo 1 Approximately how long have you had your woundso 2 months How have you been treating your wound(s) until nowo bandage Has your wound(s) ever healed and then re-openedo No Have you had any lab work done in the past montho No Have you tested positive for an antibiotic resistant organism (MRSA, VRE)o No Have you tested positive for osteomyelitis (bone infection)o No Constitutional Symptoms (General Health) Complaints and Symptoms: No Complaints or Symptoms Immunizations Pneumococcal Vaccine: Received Pneumococcal Vaccination: No Implantable Devices Family and Social History Cancer: Yes - Mother; Diabetes: No; Heart Disease: Yes - Child; Hereditary Spherocytosis: No; Hypertension: No; Kidney Disease: No; Lung Disease: No; Seizures: No; Stroke: No; Thyroid Problems: No; Tuberculosis: No; Never smoker; Marital Status - Married; Alcohol Use: Never; Drug Use: No History; Caffeine Use: Never; Financial Concerns: No; Food, Clothing or Shelter Needs: No; Support System Lacking: No; Transportation Concerns: No; Advanced Directives: Yes (Not Provided); Do not resuscitate: Yes (Not Provided); Living Will: No; Medical Power of Attorney:  No Electronic Signature(s) Signed: 07/01/2017 3:57:11 PM By: Alejandro Mulling Signed: 07/01/2017 5:05:22 PM By: Lenda Kelp PA-C Entered By: Alejandro Mulling on 07/01/2017 12:59:07 Theresa Frank ([id number]) -------------------------------------------------------------------------------- SuperBill Details Patient Name: Theresa Frank Date of Service: 07/01/2017 Medical Record Number: [id number] Patient Account Number: [id number] Date of Birth/Sex: Apr 01, 1925 (83 y.o. Female) Treating RN: Ashok Cordia, Debi Primary Care Provider: BABAOFF, MARCUS Other Clinician: Referring Provider: BABAOFF, MARCUS Treating Provider/Extender: STONE III, HOYT Weeks in Treatment: 0 Diagnosis Coding ICD-10 Codes Code Description [phone] Pressure ulcer of left buttock, stage 3 E44.0 Moderate protein-calorie malnutrition R62.7 Adult failure to thrive I10 Essential (primary) hypertension Facility Procedures CPT4 Code: [id number] Description: 99214 - WOUND CARE VISIT-LEV 4 EST PT Modifier: Quantity: 1 Physician Procedures CPT4 Code: 4166063 Description: 99204 - WC PHYS LEVEL 4 - NEW PT ICD-10 Diagnosis Description L89.323 Pressure ulcer of left buttock, stage 3 E44.0 Moderate protein-calorie malnutrition R62.7 Adult failure to thrive I10 Essential (primary) hypertension Modifier: Quantity: 1 Electronic Signature(s) Signed: 07/01/2017  5:05:22 PM By: Lenda KelpStone III, Hoyt PA-C Previous Signature: 07/01/2017 2:54:51 PM Version By: Alejandro MullingPinkerton, Debra Entered By: Lenda KelpStone III, Hoyt on 07/01/2017 17:04:24

## 2017-07-08 ENCOUNTER — Ambulatory Visit: Payer: Medicare Other | Admitting: Surgery

## 2017-07-15 ENCOUNTER — Encounter: Attending: Surgery | Admitting: Surgery

## 2017-07-15 DIAGNOSIS — I10 Essential (primary) hypertension: Secondary | ICD-10-CM | POA: Insufficient documentation

## 2017-07-15 DIAGNOSIS — G309 Alzheimer's disease, unspecified: Secondary | ICD-10-CM | POA: Insufficient documentation

## 2017-07-15 DIAGNOSIS — L89154 Pressure ulcer of sacral region, stage 4: Secondary | ICD-10-CM | POA: Insufficient documentation

## 2017-07-15 DIAGNOSIS — Z993 Dependence on wheelchair: Secondary | ICD-10-CM | POA: Insufficient documentation

## 2017-07-15 DIAGNOSIS — F0280 Dementia in other diseases classified elsewhere without behavioral disturbance: Secondary | ICD-10-CM | POA: Insufficient documentation

## 2017-07-15 DIAGNOSIS — R627 Adult failure to thrive: Secondary | ICD-10-CM | POA: Insufficient documentation

## 2017-07-15 DIAGNOSIS — L89323 Pressure ulcer of left buttock, stage 3: Secondary | ICD-10-CM | POA: Insufficient documentation

## 2017-07-15 DIAGNOSIS — E440 Moderate protein-calorie malnutrition: Secondary | ICD-10-CM | POA: Diagnosis not present

## 2017-07-17 NOTE — Progress Notes (Signed)
Theresa Frank ([id number]) Visit Report for 07/15/2017 Chief Complaint Document Details Patient Name: Theresa Frank, Theresa Frank. Date of Service: 07/15/2017 12:30 PM Medical Record Number: [id number] Patient Account Number: [id number] Date of Birth/Sex: 07/05/1925 (83 y.o. Female) Treating RN: Ashok Cordia, Debi Primary Care Provider: BABAOFF, MARCUS Other Clinician: Referring Provider: BABAOFF, MARCUS Treating Provider/Extender: Rudene Re in Treatment: 2 Information Obtained from: Patient Chief Complaint Left Gluteal Ulcer Electronic Signature(s) Signed: 07/15/2017 1:59:19 PM By: Evlyn Kanner MD, FACS Entered By: Evlyn Kanner on 07/15/2017 13:59:19 Theresa Frank ([id number]) -------------------------------------------------------------------------------- Debridement Details Patient Name: Theresa Frank Date of Service: 07/15/2017 12:30 PM Medical Record Number: [id number] Patient Account Number: [id number] Date of Birth/Sex: 1925/01/23 (83 y.o. Female) Treating RN: Ashok Cordia, Debi Primary Care Provider: BABAOFF, MARCUS Other Clinician: Referring Provider: BABAOFF, MARCUS Treating Provider/Extender: Rudene Re in Treatment: 2 Debridement Performed for Wound #1 Sacrum Assessment: Performed By: Physician Evlyn Kanner, MD Debridement: Debridement Pre-procedure Verification/Time Yes - 13:02 Out Taken: Start Time: 13:03 Pain Control: Lidocaine 4% Topical Solution Level: Skin/Subcutaneous Tissue Total Area Debrided (L x W): 7 (cm) x 3 (cm) = 21 (cm) Tissue and other material Viable, Non-Viable, Exudate, Fibrin/Slough, Subcutaneous debrided: Instrument: Blade, Forceps Bleeding: Minimum Hemostasis Achieved: Pressure End Time: 13:06 Procedural Pain: 0 Post Procedural Pain: 0 Response to Treatment: Procedure was tolerated well Post Debridement Measurements of Total Wound Length: (cm) 7 Stage: Category/Stage III Width: (cm) 3 Depth: (cm) 0.6 Volume: (cm)  9.896 Character of Wound/Ulcer Post Requires Further Debridement Debridement: Post Procedure Diagnosis Same as Pre-procedure Electronic Signature(s) Signed: 07/15/2017 1:59:13 PM By: Evlyn Kanner MD, FACS Signed: 07/15/2017 4:36:47 PM By: Alejandro Mulling Entered By: Evlyn Kanner on 07/15/2017 13:59:13 Theresa Frank ([id number]) -------------------------------------------------------------------------------- HPI Details Patient Name: Theresa Frank Date of Service: 07/15/2017 12:30 PM Medical Record Number: [id number] Patient Account Number: [id number] Date of Birth/Sex: [birth year] (83 y.o. Female) Treating RN: Ashok Cordia, Debi Primary Care Provider: BABAOFF, MARCUS Other Clinician: Referring Provider: BABAOFF, MARCUS Treating Provider/Extender: Rudene Re in Treatment: 2 History of Present Illness HPI Description: 07/01/17 on evaluation today patient appears to be doing overall okay although she is on hospice and is very malnourished appearing. She eats some and her caregivers do try to mix in peanut butter with some of her food items and work on other ways of supplementing her nutrition with proteins such as ensure but she really does not eat a sufficient amount in order to heal this wound that she is currently dealing with. She has been diagnosed with adult failure to thrive. She also has Alzheimer's disease. She therefore is very difficult to reason with. Currently she is on hospice and under their care upon reviewing the note it does appear that it was stated that the providers who saw her on 06/21/17 was going to place her on antibiotics but I'm unsure of what antibiotic she ended up being on. This was also when hospice was recommended to be involved in her care. Patient is wheelchair-bound and is prone to frequent falling. Secondary to mental status she is unable to rate or describe any discomfort she is having although with cleansing the wound she did seem to react in a  way that would tell me she is having some pain at least. 07/15/2017 -- I'm seeing the patient for the first time today and she is a 83 year old female who is fairly well looked after by her family and caregivers 24/7. She has Alzheimer's dementia and has not been ambulating well but does have a fairly  good offloading bed surface and wheelchair. Her diet is not the best. She presents with a necrotic debris on the sacral region and understand hospice is also have looking after her Electronic Signature(s) Signed: 07/15/2017 2:00:19 PM By: Evlyn Kanner MD, FACS Entered By: Evlyn Kanner on 07/15/2017 14:00:19 Theresa Frank ([id number]) -------------------------------------------------------------------------------- Physical Exam Details Patient Name: Theresa Frank Date of Service: 07/15/2017 12:30 PM Medical Record Number: [id number] Patient Account Number: [id number] Date of Birth/Sex: 13-Apr-1925 (83 y.o. Female) Treating RN: Ashok Cordia, Debi Primary Care Provider: BABAOFF, MARCUS Other Clinician: Referring Provider: BABAOFF, MARCUS Treating Provider/Extender: Rudene Re in Treatment: 2 Constitutional . Pulse regular. Respirations normal and unlabored. Afebrile. . Eyes Nonicteric. Reactive to light. Ears, Nose, Mouth, and Throat Lips, teeth, and gums WNL.Marland Kitchen Moist mucosa without lesions. Neck supple and nontender. No palpable supraclavicular or cervical adenopathy. Normal sized without goiter. Respiratory WNL. No retractions.. Cardiovascular Pedal Pulses WNL. No clubbing, cyanosis or edema. Lymphatic No adneopathy. No adenopathy. No adenopathy. Musculoskeletal Adexa without tenderness or enlargement.. Digits and nails w/o clubbing, cyanosis, infection, petechiae, ischemia, or inflammatory conditions.. Integumentary (Hair, Skin) No suspicious lesions. No crepitus or fluctuance. No peri-wound warmth or erythema. No masses.Marland Kitchen Psychiatric Judgement and insight Intact.. No  evidence of depression, anxiety, or agitation.. Notes she has a large chunk of necrotic debris in the sacral region and this needed sharp debridement with a forcep and 15 number blade. she will need Santyl dressings locally Electronic Signature(s) Signed: 07/15/2017 2:01:08 PM By: Evlyn Kanner MD, FACS Entered By: Evlyn Kanner on 07/15/2017 14:01:08 Theresa Frank ([id number]) -------------------------------------------------------------------------------- Physician Orders Details Patient Name: Theresa Frank Date of Service: 07/15/2017 12:30 PM Medical Record Number: [id number] Patient Account Number: [id number] Date of Birth/Sex: 1925/05/22 (83 y.o. Female) Treating RN: Ashok Cordia, Debi Primary Care Provider: BABAOFF, MARCUS Other Clinician: Referring Provider: BABAOFF, MARCUS Treating Provider/Extender: Rudene Re in Treatment: 2 Verbal / Phone Orders: Yes ClinicianAshok Cordia, Debi Read Back and Verified: Yes Diagnosis Coding Wound Cleansing Wound #1 Sacrum o Clean wound with Normal Saline. o Cleanse wound with mild soap and water o May Shower, gently pat wound dry prior to applying new dressing. Anesthetic Wound #1 Sacrum o Topical Lidocaine 4% cream applied to wound bed prior to debridement Skin Barriers/Peri-Wound Care Wound #1 Sacrum o Skin Prep Primary Wound Dressing Wound #1 Sacrum o Santyl Ointment Secondary Dressing Wound #1 Sacrum o Dry Gauze o Boardered Foam Dressing Dressing Change Frequency Wound #1 Sacrum o Change dressing every day. o Other: - as needed Follow-up Appointments Wound #1 Sacrum o Return Appointment in 1 week. Off-Loading Wound #1 Sacrum o Turn and reposition every 2 hours Additional Orders / Instructions Wound #1 Sacrum o Increase protein intake. ANNESSA, SATRE ([id number]) Home Health Wound #1 Sacrum o Continue Home Health Visits o Home Health Nurse may visit PRN to address patientos  wound care needs. o FACE TO FACE ENCOUNTER: MEDICARE and MEDICAID PATIENTS: I certify that this patient is under my care and that I had a face-to-face encounter that meets the physician face-to-face encounter requirements with this patient on this date. The encounter with the patient was in whole or in part for the following MEDICAL CONDITION: (primary reason for Home Healthcare) MEDICAL NECESSITY: I certify, that based on my findings, NURSING services are a medically necessary home health service. HOME BOUND STATUS: I certify that my clinical findings support that this patient is homebound (i.e., Due to illness or injury, pt requires aid of supportive devices such as crutches, cane,  wheelchairs, walkers, the use of special transportation or the assistance of another person to leave their place of residence. There is a normal inability to leave the home and doing so requires considerable and taxing effort. Other absences are for medical reasons / religious services and are infrequent or of short duration when for other reasons). o If current dressing causes regression in wound condition, may D/C ordered dressing product/s and apply Normal Saline Moist Dressing daily until next Wound Healing Center / Other MD appointment. Notify Wound Healing Center of regression in wound condition at [phone]. o Please direct any NON-WOUND related issues/requests for orders to patient's Primary Care Physician Medications-please add to medication list. Wound #1 Sacrum o Other: - Vitamin C, Zinc, MVI Patient Medications Allergies: Zocor, cefuroxime axetil, Ciproflaxin, Penicilin, Premarin, Septra Notifications Medication Indication Start End Santyl 07/15/2017 DOSE topical 250 unit/gram ointment - ointment topical as directed Electronic Signature(s) Signed: 07/15/2017 2:03:57 PM By: Evlyn Kanner MD, FACS Entered By: Evlyn Kanner on 07/15/2017 14:03:57 Theresa Frank  ([id number]) -------------------------------------------------------------------------------- Problem List Details Patient Name: Theresa Frank Date of Service: 07/15/2017 12:30 PM Medical Record Number: [id number] Patient Account Number: [id number] Date of Birth/Sex: [birth year] (83 y.o. Female) Treating RN: Ashok Cordia, Debi Primary Care Provider: BABAOFF, MARCUS Other Clinician: Referring Provider: BABAOFF, MARCUS Treating Provider/Extender: Rudene Re in Treatment: 2 Active Problems ICD-10 Encounter Code Description Active Date Diagnosis L89.323 Pressure ulcer of left buttock, stage 3 07/01/2017 Yes E44.0 Moderate protein-calorie malnutrition 07/01/2017 Yes R62.7 Adult failure to thrive 07/01/2017 Yes I10 Essential (primary) hypertension 07/01/2017 Yes L89.154 Pressure ulcer of sacral region, stage 4 07/15/2017 Yes Inactive Problems Resolved Problems Electronic Signature(s) Signed: 07/15/2017 1:58:56 PM By: Evlyn Kanner MD, FACS Entered By: Evlyn Kanner on 07/15/2017 13:58:56 Theresa Frank ([id number]) -------------------------------------------------------------------------------- Progress Note Details Patient Name: Theresa Frank Date of Service: 07/15/2017 12:30 PM Medical Record Number: [id number] Patient Account Number: [id number] Date of Birth/Sex: May 09, 1925 (83 y.o. Female) Treating RN: Ashok Cordia, Debi Primary Care Provider: BABAOFF, MARCUS Other Clinician: Referring Provider: BABAOFF, MARCUS Treating Provider/Extender: Rudene Re in Treatment: 2 Subjective Chief Complaint Information obtained from Patient Left Gluteal Ulcer History of Present Illness (HPI) 07/01/17 on evaluation today patient appears to be doing overall okay although she is on hospice and is very malnourished appearing. She eats some and her caregivers do try to mix in peanut butter with some of her food items and work on other ways of supplementing her nutrition with proteins  such as ensure but she really does not eat a sufficient amount in order to heal this wound that she is currently dealing with. She has been diagnosed with adult failure to thrive. She also has Alzheimer's disease. She therefore is very difficult to reason with. Currently she is on hospice and under their care upon reviewing the note it does appear that it was stated that the providers who saw her on 06/21/17 was going to place her on antibiotics but I'm unsure of what antibiotic she ended up being on. This was also when hospice was recommended to be involved in her care. Patient is wheelchair-bound and is prone to frequent falling. Secondary to mental status she is unable to rate or describe any discomfort she is having although with cleansing the wound she did seem to react in a way that would tell me she is having some pain at least. 07/15/2017 -- I'm seeing the patient for the first time today and she is a 83 year old female who is fairly well looked after  by her family and caregivers 24/7. She has Alzheimer's dementia and has not been ambulating well but does have a fairly good offloading bed surface and wheelchair. Her diet is not the best. She presents with a necrotic debris on the sacral region and understand hospice is also have looking after her Patient History Unable to Obtain Patient History due to Dementia. Information obtained from Patient. Family History Cancer - Mother, Heart Disease - Child, No family history of Diabetes, Hereditary Spherocytosis, Hypertension, Kidney Disease, Lung Disease, Seizures, Stroke, Thyroid Problems, Tuberculosis. Social History Never smoker, Marital Status - Married, Alcohol Use - Never, Drug Use - No History, Caffeine Use - Never. Objective Theresa ShihZACHARY, Shyteria H. ([id number]) Constitutional Pulse regular. Respirations normal and unlabored. Afebrile. Vitals Time Taken: 12:37 PM, Height: 68 in, Weight: 108 lbs, BMI: 16.4, Temperature: 97.8 F, Pulse: 88  bpm, Respiratory Rate: 16 breaths/min, Blood Pressure: 121/76 mmHg. Eyes Nonicteric. Reactive to light. Ears, Nose, Mouth, and Throat Lips, teeth, and gums WNL.Marland Kitchen. Moist mucosa without lesions. Neck supple and nontender. No palpable supraclavicular or cervical adenopathy. Normal sized without goiter. Respiratory WNL. No retractions.. Cardiovascular Pedal Pulses WNL. No clubbing, cyanosis or edema. Lymphatic No adneopathy. No adenopathy. No adenopathy. Musculoskeletal Adexa without tenderness or enlargement.. Digits and nails w/o clubbing, cyanosis, infection, petechiae, ischemia, or inflammatory conditions.Marland Kitchen. Psychiatric Judgement and insight Intact.. No evidence of depression, anxiety, or agitation.. General Notes: she has a large chunk of necrotic debris in the sacral region and this needed sharp debridement with a forcep and 15 number blade. she will need Santyl dressings locally Integumentary (Hair, Skin) No suspicious lesions. No crepitus or fluctuance. No peri-wound warmth or erythema. No masses.. Wound #1 status is Open. Original cause of wound was Pressure Injury. The wound is located on the Sacrum. The wound measures 7cm length x 3cm width x 0.5cm depth; 16.493cm^2 area and 8.247cm^3 volume. There is Fat Layer (Subcutaneous Tissue) Exposed exposed. There is no tunneling or undermining noted. There is a large amount of serous drainage noted. The wound margin is distinct with the outline attached to the wound base. There is no granulation within the wound bed. There is a large (67-100%) amount of necrotic tissue within the wound bed including Eschar and Adherent Slough. The periwound skin appearance exhibited: Maceration, Erythema. The surrounding wound skin color is noted with erythema which is circumferential. Assessment Active Problems ICD-10 L89.323 - Pressure ulcer of left buttock, stage 3 E44.0 - Moderate protein-calorie malnutrition R62.7 - Adult failure to  thrive Theresa ShihZACHARY, Theresa H. 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 I have discussed at length the need for proper offloading, adequate protein, vitamin A, vitamin C and zinc and appropriate surfaces for her bed and wheelchair. She has caregivers round-the-clock and I have discussed these details with the one at the bedside. we will see her at the wound center as often as it is possible to get her here. Procedures Wound #1 Pre-procedure diagnosis of Wound #1 is a Pressure Ulcer located on the Sacrum . There was a Skin/Subcutaneous Tissue Debridement ([id number]) debridement with total area of 21 sq cm performed by Evlyn KannerBritto, Ariyan Brisendine, MD. with the following instrument(s): Blade and Forceps to remove Viable and Non-Viable tissue/material including Exudate, Fibrin/Slough, and Subcutaneous after achieving pain control using Lidocaine 4% Topical Solution. A time out was conducted at 13:02, prior to  the start of the procedure. A Minimum amount of bleeding was controlled with Pressure. The procedure was tolerated well with a pain level of 0 throughout and a pain level of 0 following the procedure. Post Debridement Measurements: 7cm length x 3cm width x 0.6cm depth; 9.896cm^3 volume. Post debridement Stage noted as Category/Stage III. Character of Wound/Ulcer Post Debridement requires further debridement. Post procedure Diagnosis Wound #1: Same as Pre-Procedure Plan Wound Cleansing: Wound #1 Sacrum: Clean wound with Normal Saline. Cleanse wound with mild soap and water May Shower, gently pat wound dry prior to applying new dressing. Anesthetic: Wound #1 Sacrum: Topical Lidocaine 4% cream applied to wound bed prior to debridement Skin Barriers/Peri-Wound Care: Wound #1 Sacrum: Skin Prep Primary Wound Dressing: Wound #1  Sacrum: Santyl Ointment Secondary Dressing: Wound #1 Sacrum: Dry Gauze Boardered Foam Dressing Dressing Change Frequency: Wound #1 SacrumMORAIMA, Theresa H. ([id number]) Change dressing every day. Other: - as needed Follow-up Appointments: Wound #1 Sacrum: Return Appointment in 1 week. Off-Loading: Wound #1 Sacrum: Turn and reposition every 2 hours Additional Orders / Instructions: Wound #1 Sacrum: Increase protein intake. Home Health: Wound #1 Sacrum: Continue Home Health Visits Home Health Nurse may visit PRN to address patient s wound care needs. FACE TO FACE ENCOUNTER: MEDICARE and MEDICAID PATIENTS: I certify that this patient is under my care and that I had a face-to-face encounter that meets the physician face-to-face encounter requirements with this patient on this date. The encounter with the patient was in whole or in part for the following MEDICAL CONDITION: (primary reason for Home Healthcare) MEDICAL NECESSITY: I certify, that based on my findings, NURSING services are a medically necessary home health service. HOME BOUND STATUS: I certify that my clinical findings support that this patient is homebound (i.e., Due to illness or injury, pt requires aid of supportive devices such as crutches, cane, wheelchairs, walkers, the use of special transportation or the assistance of another person to leave their place of residence. There is a normal inability to leave the home and doing so requires considerable and taxing effort. Other absences are for medical reasons / religious services and are infrequent or of short duration when for other reasons). If current dressing causes regression in wound condition, may D/C ordered dressing product/s and apply Normal Saline Moist Dressing daily until next Wound Healing Center / Other MD appointment. Notify Wound Healing Center of regression in wound condition at [phone]. 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 I have discussed at length the need for proper offloading, adequate protein, vitamin A, vitamin C and zinc and appropriate surfaces for her bed and wheelchair. She has caregivers round-the-clock and I have discussed these details with the one at the bedside. we will see her at the wound center as often as it is possible to get her here. Electronic Signature(s) Signed: 07/15/2017 2:07:12 PM By: Evlyn Kanner MD, FACS Entered By: Evlyn Kanner on 07/15/2017 14:07:12 Theresa Frank ([id number]) -------------------------------------------------------------------------------- ROS/PFSH Details Patient Name: Theresa Frank Date of Service: 07/15/2017 12:30 PM Medical Record Number: [id number] Patient Account Number: [id number] Date of Birth/Sex: 1924-12-28 (83 y.o. Female) Treating RN: Ashok Cordia, Debi Primary Care Provider: BABAOFF, MARCUS  Other Clinician: Referring Provider: BABAOFF, MARCUS Treating Provider/Extender: Rudene Re in Treatment: 2 Unable to Obtain Patient History due to oo Dementia Information Obtained From Patient Wound History Do you currently have one or more open woundso Yes How many open wounds do you currently haveo 1 Approximately how long have you had your woundso 2 months How have you been treating your wound(s) until nowo bandage Has your wound(s) ever healed and then re-openedo No Have you had any lab work done in the past montho No Have you tested positive for an antibiotic resistant organism (MRSA, VRE)o No Have you tested positive for osteomyelitis (bone infection)o  No Immunizations Pneumococcal Vaccine: Received Pneumococcal Vaccination: No Implantable Devices Family and Social History Cancer: Yes - Mother; Diabetes: No; Heart Disease: Yes - Child; Hereditary Spherocytosis: No; Hypertension: No; Kidney Disease: No; Lung Disease: No; Seizures: No; Stroke: No; Thyroid Problems: No; Tuberculosis: No; Never smoker; Marital Status - Married; Alcohol Use: Never; Drug Use: No History; Caffeine Use: Never; Financial Concerns: No; Food, Clothing or Shelter Needs: No; Support System Lacking: No; Transportation Concerns: No; Advanced Directives: Yes (Not Provided); Patient does not want information on Advanced Directives; Do not resuscitate: Yes (Not Provided); Living Will: No; Medical Power of Attorney: No Physician Affirmation I have reviewed and agree with the above information. Electronic Signature(s) Signed: 07/15/2017 3:39:10 PM By: Evlyn Kanner MD, FACS Signed: 07/15/2017 4:36:47 PM By: Alejandro Mulling Entered By: Evlyn Kanner on 07/15/2017 14:00:27 Theresa Frank ([id number]) -------------------------------------------------------------------------------- SuperBill Details Patient Name: Theresa Frank Date of Service: 07/15/2017 Medical Record Number: [id number] Patient Account Number: [id number] Date of Birth/Sex: 11-Jan-1925 (83 y.o. Female) Treating RN: Ashok Cordia, Debi Primary Care Provider: BABAOFF, MARCUS Other Clinician: Referring Provider: BABAOFF, MARCUS Treating Provider/Extender: Rudene Re in Treatment: 2 Diagnosis Coding ICD-10 Codes Code Description [phone] Pressure ulcer of left buttock, stage 3 E44.0 Moderate protein-calorie malnutrition R62.7 Adult failure to thrive I10 Essential (primary) hypertension L89.154 Pressure ulcer of sacral region, stage 4 Facility Procedures CPT4 Code: [id number] Description: 11042 - DEB SUBQ TISSUE 20 SQ CM/< ICD-10 Diagnosis Description L89.154 Pressure ulcer of sacral region, stage 4  E44.0 Moderate protein-calorie malnutrition R62.7 Adult failure to thrive Modifier: Quantity: 1 CPT4 Code: [id number] Description: 11045 - DEB SUBQ TISS EA ADDL 20CM ICD-10 Diagnosis Description L89.154 Pressure ulcer of sacral region, stage 4 E44.0 Moderate protein-calorie malnutrition R62.7 Adult failure to thrive Modifier: Quantity: 1 Physician Procedures CPT4 Code: [id number] Description: 11042 - WC PHYS SUBQ TISS 20 SQ CM ICD-10 Diagnosis Description L89.154 Pressure ulcer of sacral region, stage 4 E44.0 Moderate protein-calorie malnutrition R62.7 Adult failure to thrive Modifier: Quantity: 1 CPT4 Code: [id number] Description: 11045 - WC PHYS SUBQ TISS EA ADDL 20 CM ICD-10 Diagnosis Description L89.154 Pressure ulcer of sacral region, stage 4 E44.0 Moderate protein-calorie malnutrition R62.7 Adult failure to thrive Modifier: Quantity: 1 Electronic Signature(s) Signed: 07/15/2017 2:07:36 PM By: Evlyn Kanner MD, FACS Theresa Frank, Theresa Frank ([id number]) Entered By: Evlyn Kanner on 07/15/2017 14:07:36

## 2017-07-18 NOTE — Progress Notes (Signed)
Theresa Frank, Kateria H. ([id number]) Visit Report for 07/15/2017 Arrival Information Details Patient Name: Theresa Frank, Tikesha H. Date of Service: 07/15/2017 12:30 PM Medical Record Number: [id number] Patient Account Number: [id number] Date of Birth/Sex: 09/30/1924 (83 y.o. Female) Treating RN: Ashok CordiaPinkerton, Debi Primary Care Keishana Klinger: BABAOFF, MARCUS Other Clinician: Referring Oretha Weismann: BABAOFF, MARCUS Treating Jhada Risk/Extender: Rudene ReBritto, Errol Weeks in Treatment: 2 Visit Information History Since Last Visit All ordered tests and consults were completed: No Patient Arrived: Wheel Chair Added or deleted any medications: No Arrival Time: 12:34 Any new allergies or adverse reactions: No Accompanied By: caregiver Had a fall or experienced change in No Transfer Assistance: EasyPivot Patient activities of daily living that may affect Lift risk of falls: Patient Identification Verified: Yes Signs or symptoms of abuse/neglect since last visito No Secondary Verification Process Yes Hospitalized since last visit: No Completed: Has Dressing in Place as Prescribed: Yes Patient Requires Transmission-Based No Precautions: Pain Present Now: No Patient Has Alerts: No Electronic Signature(s) Signed: 07/15/2017 4:36:47 PM By: Alejandro MullingPinkerton, Debra Entered By: Alejandro MullingPinkerton, Debra on 07/15/2017 12:35:05 Theresa Frank, Estefanny H. ([id number]) -------------------------------------------------------------------------------- Encounter Discharge Information Details Patient Name: Theresa Frank, Tiandra H. Date of Service: 07/15/2017 12:30 PM Medical Record Number: [id number] Patient Account Number: [id number] Date of Birth/Sex: 09/28/1924 (83 y.o. Female) Treating RN: Ashok CordiaPinkerton, Debi Primary Care Karry Causer: BABAOFF, MARCUS Other Clinician: Referring Tahj Njoku: BABAOFF, MARCUS Treating Liane Tribbey/Extender: Rudene ReBritto, Errol Weeks in Treatment: 2 Encounter Discharge Information Items Discharge Pain Level: 0 Discharge Condition: Stable Ambulatory Status:  Wheelchair Discharge Destination: Home Transportation: Private Auto Accompanied By: caregiver Schedule Follow-up Appointment: Yes Medication Reconciliation completed and No provided to Patient/Care Firas Guardado: Provided on Clinical Summary of Care: 07/15/2017 Form Type Recipient Paper Patient RZ Electronic Signature(s) Signed: 07/18/2017 4:27:54 PM By: Gwenlyn PerkingMoore, Shelia Entered By: Gwenlyn PerkingMoore, Shelia on 07/15/2017 13:22:05 Theresa Frank, Amilya H. ([id number]) -------------------------------------------------------------------------------- Lower Extremity Assessment Details Patient Name: Theresa Frank, Angeliyah H. Date of Service: 07/15/2017 12:30 PM Medical Record Number: [id number] Patient Account Number: [id number] Date of Birth/Sex: 02/15/1925 (83 y.o. Female) Treating RN: Phillis HaggisPinkerton, Debi Primary Care Marinna Blane: Kandyce RudBABAOFF, MARCUS Other Clinician: Referring Elgie Landino: BABAOFF, MARCUS Treating Keiko Myricks/Extender: Rudene ReBritto, Errol Weeks in Treatment: 2 Electronic Signature(s) Signed: 07/15/2017 4:36:47 PM By: Alejandro MullingPinkerton, Debra Entered By: Alejandro MullingPinkerton, Debra on 07/15/2017 12:46:13 Theresa Frank, Teara H. ([id number]) -------------------------------------------------------------------------------- Multi Wound Chart Details Patient Name: Theresa Frank, Chelcy H. Date of Service: 07/15/2017 12:30 PM Medical Record Number: [id number] Patient Account Number: [id number] Date of Birth/Sex: 01/01/1925 (83 y.o. Female) Treating RN: Ashok CordiaPinkerton, Debi Primary Care Mairany Bruno: BABAOFF, MARCUS Other Clinician: Referring Selita Staiger: BABAOFF, MARCUS Treating Stefannie Defeo/Extender: Rudene ReBritto, Errol Weeks in Treatment: 2 Vital Signs Height(in): 68 Pulse(bpm): 88 Weight(lbs): 108 Blood Pressure(mmHg): 121/76 Body Mass Index(BMI): 16 Temperature(F): 97.8 Respiratory Rate 16 (breaths/min): Photos: [1:No Photos] [N/A:N/A] Wound Location: [1:Sacrum] [N/A:N/A] Wounding Event: [1:Pressure Injury] [N/A:N/A] Primary Etiology: [1:Pressure Ulcer] [N/A:N/A] Date  Acquired: [1:05/01/2017] [N/A:N/A] Weeks of Treatment: [1:2] [N/A:N/A] Wound Status: [1:Open] [N/A:N/A] Measurements L x W x D [1:7x3x0.5] [N/A:N/A] (cm) Area (cm) : [1:16.493] [N/A:N/A] Volume (cm) : [1:8.247] [N/A:N/A] % Reduction in Area: [1:0.40%] [N/A:N/A] % Reduction in Volume: [1:-24.50%] [N/A:N/A] Classification: [1:Category/Stage III] [N/A:N/A] Exudate Amount: [1:Large] [N/A:N/A] Exudate Type: [1:Serous] [N/A:N/A] Exudate Color: [1:amber] [N/A:N/A] Wound Margin: [1:Distinct, outline attached] [N/A:N/A] Granulation Amount: [1:None Present (0%)] [N/A:N/A] Necrotic Amount: [1:Large (67-100%)] [N/A:N/A] Necrotic Tissue: [1:Eschar, Adherent Slough] [N/A:N/A] Exposed Structures: [1:Fat Layer (Subcutaneous Tissue) Exposed: Yes Fascia: No Tendon: No Muscle: No Joint: No Bone: No] [N/A:N/A] Epithelialization: [1:None] [N/A:N/A] Debridement: [1:Debridement (11042-11047)] [N/A:N/A] Pre-procedure [1:13:02] [N/A:N/A] Verification/Time Out Taken: Pain Control: [1:Lidocaine 4% Topical Solution] [N/A:N/A] Tissue Debrided: [1:Fibrin/Slough,  Exudates, Subcutaneous] [N/A:N/A] Level: [1:Skin/Subcutaneous Tissue] [N/A:N/A] Debridement Area (sq cm): 21 [N/A:N/A] Instrument: Blade, Forceps N/A N/A Bleeding: Minimum N/A N/A Hemostasis Achieved: Pressure N/A N/A Procedural Pain: 0 N/A N/A Post Procedural Pain: 0 N/A N/A Debridement Treatment Procedure was tolerated well N/A N/A Response: Post Debridement 7x3x0.6 N/A N/A Measurements L x W x D (cm) Post Debridement Volume: 9.896 N/A N/A (cm) Post Debridement Stage: Category/Stage III N/A N/A Periwound Skin Texture: No Abnormalities Noted N/A N/A Periwound Skin Moisture: Maceration: Yes N/A N/A Periwound Skin Color: Erythema: Yes N/A N/A Erythema Location: Circumferential N/A N/A Tenderness on Palpation: No N/A N/A Wound Preparation: Ulcer Cleansing: N/A N/A Rinsed/Irrigated with Saline Topical Anesthetic Applied: Other:  lidocaine 4% Procedures Performed: Debridement N/A N/A Treatment Notes Wound #1 (Sacrum) 1. Cleansed with: Clean wound with Normal Saline 2. Anesthetic Topical Lidocaine 4% cream to wound bed prior to debridement 3. Peri-wound Care: Skin Prep 4. Dressing Applied: Santyl Ointment 5. Secondary Dressing Applied Bordered Foam Dressing Dry Gauze Electronic Signature(s) Signed: 07/15/2017 1:59:01 PM By: Evlyn Kanner MD, FACS Entered By: Evlyn Kanner on 07/15/2017 13:59:01 Theresa Shih ([id number]) -------------------------------------------------------------------------------- Multi-Disciplinary Care Plan Details Patient Name: Theresa Shih Date of Service: 07/15/2017 12:30 PM Medical Record Number: [id number] Patient Account Number: [id number] Date of Birth/Sex: 1924-10-21 (83 y.o. Female) Treating RN: Ashok Cordia, Debi Primary Care Brad Mcgaughy: BABAOFF, MARCUS Other Clinician: Referring Tonea Leiphart: BABAOFF, MARCUS Treating Dangela How/Extender: Rudene Re in Treatment: 2 Active Inactive ` Abuse / Safety / Falls / Self Care Management Nursing Diagnoses: History of Falls Potential for falls Goals: Patient will not experience any injury related to falls Date Initiated: 07/01/2017 Target Resolution Date: 10/15/2017 Goal Status: Active Patient will remain injury free related to falls Date Initiated: 07/01/2017 Target Resolution Date: 10/15/2017 Goal Status: Active Interventions: Assess fall risk on admission and as needed Assess: immobility, friction, shearing, incontinence upon admission and as needed Assess impairment of mobility on admission and as needed per policy Assess self care needs on admission and as needed Notes: ` Nutrition Nursing Diagnoses: Imbalanced nutrition Potential for alteratiion in Nutrition/Potential for imbalanced nutrition Goals: Patient/caregiver agrees to and verbalizes understanding of need to use nutritional supplements and/or vitamins as  prescribed Date Initiated: 07/01/2017 Target Resolution Date: 10/15/2017 Goal Status: Active Interventions: Assess patient nutrition upon admission and as needed per policy Notes: ` Orientation to the Wound Care Program [place] ([id number]) Nursing Diagnoses: Knowledge deficit related to the wound healing center program Goals: Patient/caregiver will verbalize understanding of the Wound Healing Center Program Date Initiated: 07/01/2017 Target Resolution Date: 07/16/2017 Goal Status: Active Interventions: Provide education on orientation to the wound center Notes: ` Pain, Acute or Chronic Nursing Diagnoses: Pain, acute or chronic: actual or potential Potential alteration in comfort, pain Goals: Patient/caregiver will verbalize adequate pain control between visits Date Initiated: 07/01/2017 Target Resolution Date: Oct 09, 2017 Goal Status: Active Interventions: Complete pain assessment as per visit requirements Reposition patient for comfort Notes: ` Pressure Nursing Diagnoses: Knowledge deficit related to causes and risk factors for pressure ulcer development Knowledge deficit related to management of pressures ulcers Potential for impaired tissue integrity related to pressure, friction, moisture, and shear Goals: Patient will remain free from development of additional pressure ulcers Date Initiated: 07/01/2017 Target Resolution Date: 10/15/2017 Goal Status: Active Interventions: Assess: immobility, friction, shearing, incontinence upon admission and as needed Assess offloading mechanisms upon admission and as needed Assess potential for pressure ulcer upon admission and as needed Provide education on pressure ulcers Notes: PATIRICA, [place] ([id number])  Wound/Skin Impairment Nursing Diagnoses: Impaired tissue integrity Knowledge deficit related to ulceration/compromised skin integrity Goals: Ulcer/skin breakdown will have a volume reduction of 80% by week  12 Date Initiated: 07/01/2017 Target Resolution Date: 10/15/2017 Goal Status: Active Interventions: Assess patient/caregiver ability to perform ulcer/skin care regimen upon admission and as needed Assess ulceration(s) every visit Notes: Electronic Signature(s) Signed: 07/15/2017 4:36:47 PM By: Alejandro Mulling Entered By: Alejandro Mulling on 07/15/2017 12:46:17 Theresa Shih ([id number]) -------------------------------------------------------------------------------- Pain Assessment Details Patient Name: Theresa Shih Date of Service: 07/15/2017 12:30 PM Medical Record Number: [id number] Patient Account Number: [id number] Date of Birth/Sex: [birth year] (83 y.o. Female) Treating RN: Ashok Cordia, Debi Primary Care Alexzia Kasler: BABAOFF, MARCUS Other Clinician: Referring Sidi Dzikowski: BABAOFF, MARCUS Treating Toua Stites/Extender: Rudene Re in Treatment: 2 Active Problems Location of Pain Severity and Description of Pain Patient Has Paino No Site Locations Pain Management and Medication Current Pain Management: Electronic Signature(s) Signed: 07/15/2017 4:36:47 PM By: Alejandro Mulling Entered By: Alejandro Mulling on 07/15/2017 12:35:13 Theresa Shih ([id number]) -------------------------------------------------------------------------------- Patient/Caregiver Education Details Patient Name: Theresa Shih Date of Service: 07/15/2017 12:30 PM Medical Record Number: [id number] Patient Account Number: [id number] Date of Birth/Gender: 1925/02/28 (83 y.o. Female) Treating RN: Ashok Cordia, Debi Primary Care Physician: BABAOFF, MARCUS Other Clinician: Referring Physician: BABAOFF, MARCUS Treating Physician/Extender: Rudene Re in Treatment: 2 Education Assessment Education Provided To: Patient Education Topics Provided Wound/Skin Impairment: Handouts: Other: change dressing as ordered Methods: Demonstration, Explain/Verbal Responses: State content correctly Electronic  Signature(s) Signed: 07/15/2017 4:36:47 PM By: Alejandro Mulling Entered By: Alejandro Mulling on 07/15/2017 13:21:37 Theresa Shih ([id number]) -------------------------------------------------------------------------------- Wound Assessment Details Patient Name: Theresa Shih. Date of Service: 07/15/2017 12:30 PM Medical Record Number: [id number] Patient Account Number: [id number] Date of Birth/Sex: 05-07-1925 (83 y.o. Female) Treating RN: Ashok Cordia, Debi Primary Care Elfida Shimada: BABAOFF, MARCUS Other Clinician: Referring Keyonia Gluth: BABAOFF, MARCUS Treating Rainelle Sulewski/Extender: Rudene Re in Treatment: 2 Wound Status Wound Number: 1 Primary Etiology: Pressure Ulcer Wound Location: Sacrum Wound Status: Open Wounding Event: Pressure Injury Date Acquired: 05/01/2017 Weeks Of Treatment: 2 Clustered Wound: No Photos Photo Uploaded By: Alejandro Mulling on 07/15/2017 16:04:46 Wound Measurements Length: (cm) 7 Width: (cm) 3 Depth: (cm) 0.5 Area: (cm) 16.493 Volume: (cm) 8.247 % Reduction in Area: 0.4% % Reduction in Volume: -24.5% Epithelialization: None Tunneling: No Undermining: No Wound Description Classification: Category/Stage III Wound Margin: Distinct, outline attached Exudate Amount: Large Exudate Type: Serous Exudate Color: amber Foul Odor After Cleansing: No Slough/Fibrino Yes Wound Bed Granulation Amount: None Present (0%) Exposed Structure Necrotic Amount: Large (67-100%) Fascia Exposed: No Necrotic Quality: Eschar, Adherent Slough Fat Layer (Subcutaneous Tissue) Exposed: Yes Tendon Exposed: No Muscle Exposed: No Joint Exposed: No Bone Exposed: No Periwound Skin Texture JASMIEN, BADR. ([id number]) Texture Color No Abnormalities Noted: No No Abnormalities Noted: No Erythema: Yes Moisture Erythema Location: Circumferential No Abnormalities Noted: No Maceration: Yes Wound Preparation Ulcer Cleansing: Rinsed/Irrigated with Saline Topical  Anesthetic Applied: Other: lidocaine 4%, Treatment Notes Wound #1 (Sacrum) 1. Cleansed with: Clean wound with Normal Saline 2. Anesthetic Topical Lidocaine 4% cream to wound bed prior to debridement 3. Peri-wound Care: Skin Prep 4. Dressing Applied: Santyl Ointment 5. Secondary Dressing Applied Bordered Foam Dressing Dry Gauze Electronic Signature(s) Signed: 07/15/2017 4:36:47 PM By: Alejandro Mulling Entered By: Alejandro Mulling on 07/15/2017 12:46:01 Theresa Shih ([id number]) -------------------------------------------------------------------------------- Vitals Details Patient Name: Theresa Shih Date of Service: 07/15/2017 12:30 PM Medical Record Number: [id number] Patient Account Number: [id number] Date of Birth/Sex: 1925/04/25 (83 y.o. Female) Treating RN: Ashok Cordia, Debi  Primary Care Chrishawna Farina: BABAOFF, MARCUS Other Clinician: Referring Lakeyshia Tuckerman: BABAOFF, MARCUS Treating Macayla Ekdahl/Extender: Rudene Re in Treatment: 2 Vital Signs Time Taken: 12:37 Temperature (F): 97.8 Height (in): 68 Pulse (bpm): 88 Weight (lbs): 108 Respiratory Rate (breaths/min): 16 Body Mass Index (BMI): 16.4 Blood Pressure (mmHg): 121/76 Reference Range: 80 - 120 mg / dl Electronic Signature(s) Signed: 07/15/2017 4:36:47 PM By: Alejandro Mulling Entered By: Alejandro Mulling on 07/15/2017 12:39:33

## 2017-07-22 ENCOUNTER — Encounter: Admitting: Surgery

## 2017-07-22 DIAGNOSIS — L89323 Pressure ulcer of left buttock, stage 3: Secondary | ICD-10-CM | POA: Diagnosis not present

## 2017-07-24 NOTE — Progress Notes (Signed)
LARUEN, RISSER ([id number]) Visit Report for 07/22/2017 Arrival Information Details Patient Name: Theresa Frank, Theresa Frank. Date of Service: 07/22/2017 12:30 PM Medical Record Number: [id number] Patient Account Number: [id number] Date of Birth/Sex: [birth year] (83 y.o. Female) Treating RN: Curtis Sites Primary Care Tayva Easterday: BABAOFF, MARCUS Other Clinician: Referring Othel Dicostanzo: BABAOFF, MARCUS Treating Kandee Escalante/Extender: Rudene Re in Treatment: 3 Visit Information History Since Last Visit Added or deleted any medications: No Patient Arrived: Wheel Chair Any new allergies or adverse reactions: No Arrival Time: 12:40 Had Theresa Frank fall or experienced change in No activities of daily living that may affect Accompanied By: caregiver risk of falls: Transfer Assistance: Manual Signs or symptoms of abuse/neglect since last visito No Patient Identification Verified: Yes Hospitalized since last visit: No Secondary Verification Process Completed: Yes Has Dressing in Place as Prescribed: Yes Patient Requires Transmission-Based No Pain Present Now: No Precautions: Patient Has Alerts: No Electronic Signature(s) Signed: 07/22/2017 5:02:13 PM By: Curtis Sites Entered By: Curtis Sites on 07/22/2017 12:45:27 Theresa Frank ([id number]) -------------------------------------------------------------------------------- Encounter Discharge Information Details Patient Name: Theresa Frank Date of Service: 07/22/2017 12:30 PM Medical Record Number: [id number] Patient Account Number: [id number] Date of Birth/Sex: Jun 22, 1925 (83 y.o. Female) Treating RN: Curtis Sites Primary Care Sharea Guinther: BABAOFF, MARCUS Other Clinician: Referring Arval Brandstetter: BABAOFF, MARCUS Treating Zariah Cavendish/Extender: Rudene Re in Treatment: 3 Encounter Discharge Information Items Discharge Pain Level: 0 Discharge Condition: Stable Ambulatory Status: Wheelchair Discharge Destination: Home Transportation: Private  Auto Accompanied By: caregiver Schedule Follow-up Appointment: Yes Medication Reconciliation completed and No provided to Patient/Care Lavida Patch: Provided on Clinical Summary of Care: 07/22/2017 Form Type Recipient Paper Patient RZ Electronic Signature(s) Signed: 07/22/2017 5:02:13 PM By: Curtis Sites Entered By: Curtis Sites on 07/22/2017 13:44:31 Theresa Frank ([id number]) -------------------------------------------------------------------------------- Multi Wound Chart Details Patient Name: Theresa Frank Date of Service: 07/22/2017 12:30 PM Medical Record Number: [id number] Patient Account Number: [id number] Date of Birth/Sex: Jul 09, 1925 (83 y.o. Female) Treating RN: Curtis Sites Primary Care Owen Pagnotta: BABAOFF, MARCUS Other Clinician: Referring Guneet Delpino: BABAOFF, MARCUS Treating Braelen Sproule/Extender: Rudene Re in Treatment: 3 Vital Signs Height(in): 68 Pulse(bpm): 79 Weight(lbs): 108 Blood Pressure(mmHg): 96/65 Body Mass Index(BMI): 16 Temperature(F): 97.9 Respiratory Rate 16 (breaths/min): Photos: [1:No Photos] [N/Theresa Frank:N/Theresa Frank] Wound Location: [1:Sacrum] [N/Theresa Frank:N/Theresa Frank] Wounding Event: [1:Pressure Injury] [N/Theresa Frank:N/Theresa Frank] Primary Etiology: [1:Pressure Ulcer] [N/Theresa Frank:N/Theresa Frank] Date Acquired: [1:05/01/2017] [N/Theresa Frank:N/Theresa Frank] Weeks of Treatment: [1:3] [N/Theresa Frank:N/Theresa Frank] Wound Status: [1:Open] [N/Theresa Frank:N/Theresa Frank] Measurements L x W x D [1:6.2x3x1.2] [N/Theresa Frank:N/Theresa Frank] (cm) Area (cm) : [1:14.608] [N/Theresa Frank:N/Theresa Frank] Volume (cm) : [1:17.53] [N/Theresa Frank:N/Theresa Frank] % Reduction in Area: [1:11.80%] [N/Theresa Frank:N/Theresa Frank] % Reduction in Volume: [1:-164.70%] [N/Theresa Frank:N/Theresa Frank] Classification: [1:Category/Stage III] [N/Theresa Frank:N/Theresa Frank] Exudate Amount: [1:Large] [N/Theresa Frank:N/Theresa Frank] Exudate Type: [1:Serous] [N/Theresa Frank:N/Theresa Frank] Exudate Color: [1:amber] [N/Theresa Frank:N/Theresa Frank] Wound Margin: [1:Distinct, outline attached] [N/Theresa Frank:N/Theresa Frank] Granulation Amount: [1:Small (1-33%)] [N/Theresa Frank:N/Theresa Frank] Granulation Quality: [1:Red] [N/Theresa Frank:N/Theresa Frank] Necrotic Amount: [1:Large (67-100%)] [N/Theresa Frank:N/Theresa Frank] Necrotic Tissue: [1:Eschar, Adherent  Slough] [N/Theresa Frank:N/Theresa Frank] Exposed Structures: [1:Fat Layer (Subcutaneous Tissue) Exposed: Yes Fascia: No Tendon: No Muscle: No Joint: No Bone: No] [N/Theresa Frank:N/Theresa Frank] Epithelialization: [1:None] [N/Theresa Frank:N/Theresa Frank] Debridement: [1:Debridement (11042-11047)] [N/Theresa Frank:N/Theresa Frank] Pre-procedure [1:13:02] [N/Theresa Frank:N/Theresa Frank] Verification/Time Out Taken: Pain Control: [1:Lidocaine 4% Topical Solution] [N/Theresa Frank:N/Theresa Frank] Tissue Debrided: [1:Necrotic/Eschar, Fibrin/Slough, Subcutaneous] [N/Theresa Frank:N/Theresa Frank] Level: [1:Skin/Subcutaneous Tissue] [N/Theresa Frank:N/Theresa Frank] Debridement Area (sq cm): 18.6 N/Theresa Frank N/Theresa Frank Instrument: Forceps, Scissors N/Theresa Frank N/Theresa Frank Bleeding: Minimum N/Theresa Frank N/Theresa Frank Hemostasis Achieved: Pressure N/Theresa Frank N/Theresa Frank Procedural Pain: 0 N/Theresa Frank N/Theresa Frank Post Procedural Pain: 0 N/Theresa Frank N/Theresa Frank Post Debridement 6.2x3x1.4 N/Theresa Frank N/Theresa Frank Measurements L x W x D (cm) Post Debridement Volume: 20.452 N/Theresa Frank N/Theresa Frank (cm) Post Debridement Stage: Category/Stage III N/Theresa Frank N/Theresa Frank Periwound Skin Texture: No Abnormalities Noted N/Theresa Frank N/Theresa Frank Periwound Skin Moisture: Maceration: Yes N/Theresa Frank N/Theresa Frank Periwound  Skin Color: Erythema: Yes N/Theresa Frank N/Theresa Frank Erythema Location: Circumferential N/Theresa Frank N/Theresa Frank Temperature: No Abnormality N/Theresa Frank N/Theresa Frank Tenderness on Palpation: Yes N/Theresa Frank N/Theresa Frank Wound Preparation: Ulcer Cleansing: N/Theresa Frank N/Theresa Frank Rinsed/Irrigated with Saline Topical Anesthetic Applied: Other: lidocaine 4% Procedures Performed: Debridement N/Theresa Frank N/Theresa Frank Treatment Notes Electronic Signature(s) Signed: 07/22/2017 1:11:51 PM By: Evlyn Kanner MD, FACS Entered By: Evlyn Kanner on 07/22/2017 13:11:51 Theresa Frank ([id number]) -------------------------------------------------------------------------------- Multi-Disciplinary Care Plan Details Patient Name: Theresa Frank Date of Service: 07/22/2017 12:30 PM Medical Record Number: [id number] Patient Account Number: [id number] Date of Birth/Sex: [birth year] (83 y.o. Female) Treating RN: Curtis Sites Primary Care Nisaiah Bechtol: BABAOFF, MARCUS Other Clinician: Referring Babara Buffalo: BABAOFF, MARCUS Treating Rozina Pointer/Extender:  Rudene Re in Treatment: 3 Active Inactive ` Abuse / Safety / Falls / Self Care Management Nursing Diagnoses: History of Falls Potential for falls Goals: Patient will not experience any injury related to falls Date Initiated: 07/01/2017 Target Resolution Date: 10/15/2017 Goal Status: Active Patient will remain injury free related to falls Date Initiated: 07/01/2017 Target Resolution Date: 10/15/2017 Goal Status: Active Interventions: Assess fall risk on admission and as needed Assess: immobility, friction, shearing, incontinence upon admission and as needed Assess impairment of mobility on admission and as needed per policy Assess self care needs on admission and as needed Notes: ` Nutrition Nursing Diagnoses: Imbalanced nutrition Potential for alteratiion in Nutrition/Potential for imbalanced nutrition Goals: Patient/caregiver agrees to and verbalizes understanding of need to use nutritional supplements and/or vitamins as prescribed Date Initiated: 07/01/2017 Target Resolution Date: 10/15/2017 Goal Status: Active Interventions: Assess patient nutrition upon admission and as needed per policy Notes: ` Orientation to the Wound Care Program [place] ([id number]) Nursing Diagnoses: Knowledge deficit related to the wound healing center program Goals: Patient/caregiver will verbalize understanding of the Wound Healing Center Program Date Initiated: 07/01/2017 Target Resolution Date: 07/16/2017 Goal Status: Active Interventions: Provide education on orientation to the wound center Notes: ` Pain, Acute or Chronic Nursing Diagnoses: Pain, acute or chronic: actual or potential Potential alteration in comfort, pain Goals: Patient/caregiver will verbalize adequate pain control between visits Date Initiated: 07/01/2017 Target Resolution Date: 10/09/17 Goal Status: Active Interventions: Complete pain assessment as per visit requirements Reposition patient for  comfort Notes: ` Pressure Nursing Diagnoses: Knowledge deficit related to causes and risk factors for pressure ulcer development Knowledge deficit related to management of pressures ulcers Potential for impaired tissue integrity related to pressure, friction, moisture, and shear Goals: Patient will remain free from development of additional pressure ulcers Date Initiated: 07/01/2017 Target Resolution Date: 10/15/2017 Goal Status: Active Interventions: Assess: immobility, friction, shearing, incontinence upon admission and as needed Assess offloading mechanisms upon admission and as needed Assess potential for pressure ulcer upon admission and as needed Provide education on pressure ulcers Notes: JOYELL, THI ([id number]) Wound/Skin Impairment Nursing Diagnoses: Impaired tissue integrity Knowledge deficit related to ulceration/compromised skin integrity Goals: Ulcer/skin breakdown will have Theresa Frank volume reduction of 80% by week 12 Date Initiated: 07/01/2017 Target Resolution Date: 10/15/2017 Goal Status: Active Interventions: Assess patient/caregiver ability to perform ulcer/skin care regimen upon admission and as needed Assess ulceration(s) every visit Notes: Electronic Signature(s) Signed: 07/22/2017 5:02:13 PM By: Curtis Sites Entered By: Curtis Sites on 07/22/2017 13:02:36 Theresa Frank ([id number]) -------------------------------------------------------------------------------- Pain Assessment Details Patient Name: Theresa Frank Date of Service: 07/22/2017 12:30 PM Medical Record Number: [id number] Patient Account Number: [id number] Date of Birth/Sex: December 21, 1924 (83 y.o. Female) Treating RN: Curtis Sites Primary Care Tameshia Bonneville: Kandyce Rud Other Clinician: Referring Shelda Truby: BABAOFF, MARCUS Treating Merle Whitehorn/Extender: Rudene Re  in Treatment: 3 Active Problems Location of Pain Severity and Description of Pain Patient Has Paino No Site  Locations With Dressing Change: Yes Duration of the Pain. Constant / Intermittento Intermittent Pain Management and Medication Current Pain Management: Notes hurts when i sit on it Electronic Signature(s) Signed: 07/22/2017 5:02:13 PM By: Curtis Sitesorthy, Joanna Entered By: Curtis Sitesorthy, Joanna on 07/22/2017 12:45:57 Theresa Frank, Ahnesti H. ([id number]) -------------------------------------------------------------------------------- Patient/Caregiver Education Details Patient Name: Theresa Frank, Theresa H. Date of Service: 07/22/2017 12:30 PM Medical Record Number: [id number] Patient Account Number: [id number] Date of Birth/Gender: 04/17/1925 (83 y.o. Female) Treating RN: Curtis Sitesorthy, Joanna Primary Care Physician: BABAOFF, MARCUS Other Clinician: Referring Physician: BABAOFF, MARCUS Treating Physician/Extender: Rudene ReBritto, Errol Weeks in Treatment: 3 Education Assessment Education Provided To: Patient Education Topics Provided Wound/Skin Impairment: Handouts: Other: wound care as ordered Methods: Demonstration, Explain/Verbal Responses: State content correctly Electronic Signature(s) Signed: 07/22/2017 5:02:13 PM By: Curtis Sitesorthy, Joanna Entered By: Curtis Sitesorthy, Joanna on 07/22/2017 13:44:46 Theresa Frank, Theresa H. ([id number]) -------------------------------------------------------------------------------- Wound Assessment Details Patient Name: Theresa Frank, Theresa H. Date of Service: 07/22/2017 12:30 PM Medical Record Number: [id number] Patient Account Number: [id number] Date of Birth/Sex: 07/28/1925 (83 y.o. Female) Treating RN: Curtis Sitesorthy, Joanna Primary Care Rayan Dyal: BABAOFF, MARCUS Other Clinician: Referring Hayze Gazda: BABAOFF, MARCUS Treating Harvest Stanco/Extender: Rudene ReBritto, Errol Weeks in Treatment: 3 Wound Status Wound Number: 1 Primary Etiology: Pressure Ulcer Wound Location: Sacrum Wound Status: Open Wounding Event: Pressure Injury Date Acquired: 05/01/2017 Weeks Of Treatment: 3 Clustered Wound: No Photos Photo Uploaded By:  Curtis Sitesorthy, Joanna on 07/22/2017 14:27:25 Wound Measurements Length: (cm) 6.2 Width: (cm) 3 Depth: (cm) 1.2 Area: (cm) 14.608 Volume: (cm) 17.53 % Reduction in Area: 11.8% % Reduction in Volume: -164.7% Epithelialization: None Tunneling: No Undermining: No Wound Description Classification: Category/Stage III Wound Margin: Distinct, outline attached Exudate Amount: Large Exudate Type: Serous Exudate Color: amber Foul Odor After Cleansing: No Slough/Fibrino Yes Wound Bed Granulation Amount: Small (1-33%) Exposed Structure Granulation Quality: Red Fascia Exposed: No Necrotic Amount: Large (67-100%) Fat Layer (Subcutaneous Tissue) Exposed: Yes Necrotic Quality: Eschar, Adherent Slough Tendon Exposed: No Muscle Exposed: No Joint Exposed: No Bone Exposed: No Periwound Skin Texture Theresa Frank, Theresa H. ([id number]) Texture Color No Abnormalities Noted: No No Abnormalities Noted: No Erythema: Yes Moisture Erythema Location: Circumferential No Abnormalities Noted: No Maceration: Yes Temperature / Pain Temperature: No Abnormality Tenderness on Palpation: Yes Wound Preparation Ulcer Cleansing: Rinsed/Irrigated with Saline Topical Anesthetic Applied: Other: lidocaine 4%, Treatment Notes Wound #1 (Sacrum) 1. Cleansed with: Clean wound with Normal Saline 2. Anesthetic Topical Lidocaine 4% cream to wound bed prior to debridement 4. Dressing Applied: Santyl Ointment 5. Secondary Dressing Applied Bordered Foam Dressing Dry Gauze Electronic Signature(s) Signed: 07/22/2017 5:02:13 PM By: Curtis Sitesorthy, Joanna Entered By: Curtis Sitesorthy, Joanna on 07/22/2017 12:57:29 Theresa Frank, Theresa H. ([id number]) -------------------------------------------------------------------------------- Vitals Details Patient Name: Theresa Frank, Theresa H. Date of Service: 07/22/2017 12:30 PM Medical Record Number: [id number] Patient Account Number: [id number] Date of Birth/Sex: 03/04/1925 (83 y.o. Female) Treating RN: Curtis Sitesorthy,  Joanna Primary Care Zameria Vogl: BABAOFF, MARCUS Other Clinician: Referring Cara Aguino: BABAOFF, MARCUS Treating Montrice Montuori/Extender: Rudene ReBritto, Errol Weeks in Treatment: 3 Vital Signs Time Taken: 12:49 Temperature (F): 97.9 Height (in): 68 Pulse (bpm): 79 Weight (lbs): 108 Respiratory Rate (breaths/min): 16 Body Mass Index (BMI): 16.4 Blood Pressure (mmHg): 96/65 Reference Range: 80 - 120 mg / dl Electronic Signature(s) Signed: 07/22/2017 5:02:13 PM By: Curtis Sitesorthy, Joanna Entered By: Curtis Sitesorthy, Joanna on 07/22/2017 12:50:05

## 2017-07-24 NOTE — Progress Notes (Signed)
Theresa Frank, Theresa Frank ([id number]) Visit Report for 07/22/2017 Chief Complaint Document Details Patient Name: Theresa Frank, Theresa Frank. Date of Service: 07/22/2017 12:30 PM Medical Record Number: [id number] Patient Account Number: [id number] Date of Birth/Sex: 1925/02/27 (83 y.o. Female) Treating RN: Curtis Sites Primary Care Provider: Kandyce Rud Other Clinician: Referring Provider: BABAOFF, MARCUS Treating Provider/Extender: Rudene Re in Treatment: 3 Information Obtained from: Patient Chief Complaint Left Gluteal Ulcer Electronic Signature(s) Signed: 07/22/2017 1:12:08 PM By: Evlyn Kanner MD, FACS Entered By: Evlyn Kanner on 07/22/2017 13:12:08 Theresa Frank ([id number]) -------------------------------------------------------------------------------- Debridement Details Patient Name: Theresa Frank Date of Service: 07/22/2017 12:30 PM Medical Record Number: [id number] Patient Account Number: [id number] Date of Birth/Sex: [birth year] (83 y.o. Female) Treating RN: Curtis Sites Primary Care Provider: BABAOFF, MARCUS Other Clinician: Referring Provider: BABAOFF, MARCUS Treating Provider/Extender: Rudene Re in Treatment: 3 Debridement Performed for Wound #1 Sacrum Assessment: Performed By: Physician Evlyn Kanner, MD Debridement: Debridement Pre-procedure Verification/Time Yes - 13:02 Out Taken: Start Time: 13:02 Pain Control: Lidocaine 4% Topical Solution Level: Skin/Subcutaneous Tissue Total Area Debrided (L x W): 6.2 (cm) x 3 (cm) = 18.6 (cm) Tissue and other material Viable, Non-Viable, Eschar, Fibrin/Slough, Subcutaneous debrided: Instrument: Forceps, Scissors Bleeding: Minimum Hemostasis Achieved: Pressure End Time: 13:05 Procedural Pain: 0 Post Procedural Pain: 0 Post Debridement Measurements of Total Wound Length: (cm) 6.2 Stage: Category/Stage III Width: (cm) 3 Depth: (cm) 1.4 Volume: (cm) 20.452 Character of Wound/Ulcer  Post Improved Debridement: Post Procedure Diagnosis Same as Pre-procedure Electronic Signature(s) Signed: 07/22/2017 1:12:01 PM By: Evlyn Kanner MD, FACS Signed: 07/22/2017 5:02:13 PM By: Curtis Sites Entered By: Evlyn Kanner on 07/22/2017 13:12:01 Theresa Frank ([id number]) -------------------------------------------------------------------------------- HPI Details Patient Name: Theresa Frank Date of Service: 07/22/2017 12:30 PM Medical Record Number: [id number] Patient Account Number: [id number] Date of Birth/Sex: December 24, 1924 (83 y.o. Female) Treating RN: Curtis Sites Primary Care Provider: Kandyce Rud Other Clinician: Referring Provider: BABAOFF, MARCUS Treating Provider/Extender: Rudene Re in Treatment: 3 History of Present Illness HPI Description: 07/01/17 on evaluation today patient appears to be doing overall okay although she is on hospice and is very malnourished appearing. She eats some and her caregivers do try to mix in peanut butter with some of her food items and work on other ways of supplementing her nutrition with proteins such as ensure but she really does not eat a sufficient amount in order to heal this wound that she is currently dealing with. She has been diagnosed with adult failure to thrive. She also has Alzheimer's disease. She therefore is very difficult to reason with. Currently she is on hospice and under their care upon reviewing the note it does appear that it was stated that the providers who saw her on 06/21/17 was going to place her on antibiotics but I'm unsure of what antibiotic she ended up being on. This was also when hospice was recommended to be involved in her care. Patient is wheelchair-bound and is prone to frequent falling. Secondary to mental status she is unable to rate or describe any discomfort she is having although with cleansing the wound she did seem to react in a way that would tell me she is having some pain at  least. 07/15/2017 -- I'm seeing the patient for the first time today and she is a 83 year old female who is fairly well looked after by her family and caregivers 24/7. She has Alzheimer's dementia and has not been ambulating well but does have a fairly good offloading bed surface and wheelchair. Her diet is  not the best. She presents with a necrotic debris on the sacral region and understand hospice is also have looking after her Electronic Signature(s) Signed: 07/22/2017 1:12:21 PM By: Evlyn Kanner MD, FACS Entered By: Evlyn Kanner on 07/22/2017 13:12:21 Theresa Frank ([id number]) -------------------------------------------------------------------------------- Physical Exam Details Patient Name: Theresa Frank Date of Service: 07/22/2017 12:30 PM Medical Record Number: [id number] Patient Account Number: [id number] Date of Birth/Sex: 1925-06-24 (83 y.o. Female) Treating RN: Curtis Sites Primary Care Provider: BABAOFF, MARCUS Other Clinician: Referring Provider: BABAOFF, MARCUS Treating Provider/Extender: Rudene Re in Treatment: 3 Constitutional . Pulse regular. Respirations normal and unlabored. Afebrile. . Eyes Nonicteric. Reactive to light. Ears, Nose, Mouth, and Throat Lips, teeth, and gums WNL.Marland Kitchen Moist mucosa without lesions. Neck supple and nontender. No palpable supraclavicular or cervical adenopathy. Normal sized without goiter. Respiratory WNL. No retractions.. Cardiovascular Pedal Pulses WNL. No clubbing, cyanosis or edema. Lymphatic No adneopathy. No adenopathy. No adenopathy. Musculoskeletal Adexa without tenderness or enlargement.. Digits and nails w/o clubbing, cyanosis, infection, petechiae, ischemia, or inflammatory conditions.. Integumentary (Hair, Skin) No suspicious lesions. No crepitus or fluctuance. No peri-wound warmth or erythema. No masses.Marland Kitchen Psychiatric Judgement and insight Intact.. No evidence of depression, anxiety, or  agitation.. Notes last week we had removed a large amount of necrotic debris with a blade and this time around using a forceps and scissors I was able to remove a lot of subcutaneous debris. No bleeding after debridement. Does not probe down to bone. Will continue to use Santyl ointment locally Electronic Signature(s) Signed: 07/22/2017 1:13:19 PM By: Evlyn Kanner MD, FACS Entered By: Evlyn Kanner on 07/22/2017 13:13:19 Theresa Frank ([id number]) -------------------------------------------------------------------------------- Physician Orders Details Patient Name: Theresa Frank Date of Service: 07/22/2017 12:30 PM Medical Record Number: [id number] Patient Account Number: [id number] Date of Birth/Sex: 10-16-1924 (83 y.o. Female) Treating RN: Curtis Sites Primary Care Provider: BABAOFF, MARCUS Other Clinician: Referring Provider: BABAOFF, MARCUS Treating Provider/Extender: Rudene Re in Treatment: 3 Verbal / Phone Orders: No Diagnosis Coding Wound Cleansing Wound #1 Sacrum o Clean wound with Normal Saline. o Cleanse wound with mild soap and water o May Shower, gently pat wound dry prior to applying new dressing. Anesthetic Wound #1 Sacrum o Topical Lidocaine 4% cream applied to wound bed prior to debridement Skin Barriers/Peri-Wound Care Wound #1 Sacrum o Skin Prep Primary Wound Dressing Wound #1 Sacrum o Santyl Ointment Secondary Dressing Wound #1 Sacrum o Dry Gauze o Boardered Foam Dressing Dressing Change Frequency Wound #1 Sacrum o Change dressing every day. o Other: - as needed Follow-up Appointments Wound #1 Sacrum o Return Appointment in 1 week. Off-Loading Wound #1 Sacrum o Turn and reposition every 2 hours Additional Orders / Instructions Wound #1 Sacrum o Increase protein intake. Theresa Frank, Theresa Frank ([id number]) Home Health Wound #1 Sacrum o Continue Home Health Visits o Home Health Nurse may visit PRN to  address patientos wound care needs. o FACE TO FACE ENCOUNTER: MEDICARE and MEDICAID PATIENTS: I certify that this patient is under my care and that I had a face-to-face encounter that meets the physician face-to-face encounter requirements with this patient on this date. The encounter with the patient was in whole or in part for the following MEDICAL CONDITION: (primary reason for Home Healthcare) MEDICAL NECESSITY: I certify, that based on my findings, NURSING services are a medically necessary home health service. HOME BOUND STATUS: I certify that my clinical findings support that this patient is homebound (i.e., Due to illness or injury, pt requires aid of supportive devices such  as crutches, cane, wheelchairs, walkers, the use of special transportation or the assistance of another person to leave their place of residence. There is a normal inability to leave the home and doing so requires considerable and taxing effort. Other absences are for medical reasons / religious services and are infrequent or of short duration when for other reasons). o If current dressing causes regression in wound condition, may D/C ordered dressing product/s and apply Normal Saline Moist Dressing daily until next Wound Healing Center / Other MD appointment. Notify Wound Healing Center of regression in wound condition at [phone]. o Please direct any NON-WOUND related issues/requests for orders to patient's Primary Care Physician Medications-please add to medication list. Wound #1 Sacrum o Other: - Vitamin C, Zinc, MVI Electronic Signature(s) Signed: 07/22/2017 4:27:13 PM By: Evlyn Kanner MD, FACS Signed: 07/22/2017 5:02:13 PM By: Curtis Sites Entered By: Curtis Sites on 07/22/2017 13:07:07 Theresa Frank ([id number]) -------------------------------------------------------------------------------- Problem List Details Patient Name: Theresa Frank, Theresa Frank. Date of Service: 07/22/2017 12:30  PM Medical Record Number: [id number] Patient Account Number: [id number] Date of Birth/Sex: 10-09-1924 (83 y.o. Female) Treating RN: Curtis Sites Primary Care Provider: Kandyce Rud Other Clinician: Referring Provider: BABAOFF, MARCUS Treating Provider/Extender: Rudene Re in Treatment: 3 Active Problems ICD-10 Encounter Code Description Active Date Diagnosis L89.323 Pressure ulcer of left buttock, stage 3 07/01/2017 Yes E44.0 Moderate protein-calorie malnutrition 07/01/2017 Yes R62.7 Adult failure to thrive 07/01/2017 Yes I10 Essential (primary) hypertension 07/01/2017 Yes L89.154 Pressure ulcer of sacral region, stage 4 07/15/2017 Yes Inactive Problems Resolved Problems Electronic Signature(s) Signed: 07/22/2017 1:11:47 PM By: Evlyn Kanner MD, FACS Entered By: Evlyn Kanner on 07/22/2017 13:11:47 Theresa Frank ([id number]) -------------------------------------------------------------------------------- Progress Note Details Patient Name: Theresa Frank Date of Service: 07/22/2017 12:30 PM Medical Record Number: [id number] Patient Account Number: [id number] Date of Birth/Sex: 03-12-1925 (83 y.o. Female) Treating RN: Curtis Sites Primary Care Provider: Kandyce Rud Other Clinician: Referring Provider: BABAOFF, MARCUS Treating Provider/Extender: Rudene Re in Treatment: 3 Subjective Chief Complaint Information obtained from Patient Left Gluteal Ulcer History of Present Illness (HPI) 07/01/17 on evaluation today patient appears to be doing overall okay although she is on hospice and is very malnourished appearing. She eats some and her caregivers do try to mix in peanut butter with some of her food items and work on other ways of supplementing her nutrition with proteins such as ensure but she really does not eat a sufficient amount in order to heal this wound that she is currently dealing with. She has been diagnosed with adult failure to thrive. She  also has Alzheimer's disease. She therefore is very difficult to reason with. Currently she is on hospice and under their care upon reviewing the note it does appear that it was stated that the providers who saw her on 06/21/17 was going to place her on antibiotics but I'm unsure of what antibiotic she ended up being on. This was also when hospice was recommended to be involved in her care. Patient is wheelchair-bound and is prone to frequent falling. Secondary to mental status she is unable to rate or describe any discomfort she is having although with cleansing the wound she did seem to react in a way that would tell me she is having some pain at least. 07/15/2017 -- I'm seeing the patient for the first time today and she is a 83 year old female who is fairly well looked after by her family and caregivers 24/7. She has Alzheimer's dementia and has not been ambulating well but  does have a fairly good offloading bed surface and wheelchair. Her diet is not the best. She presents with a necrotic debris on the sacral region and understand hospice is also have looking after her Patient History Unable to Obtain Patient History due to Dementia. Information obtained from Patient. Family History Cancer - Mother, Heart Disease - Child, No family history of Diabetes, Hereditary Spherocytosis, Hypertension, Kidney Disease, Lung Disease, Seizures, Stroke, Thyroid Problems, Tuberculosis. Social History Never smoker, Marital Status - Married, Alcohol Use - Never, Drug Use - No History, Caffeine Use - Never. Objective Theresa Frank, Theresa Frank ([id number]) Constitutional Pulse regular. Respirations normal and unlabored. Afebrile. Vitals Time Taken: 12:49 PM, Height: 68 in, Weight: 108 lbs, BMI: 16.4, Temperature: 97.9 F, Pulse: 79 bpm, Respiratory Rate: 16 breaths/min, Blood Pressure: 96/65 mmHg. Eyes Nonicteric. Reactive to light. Ears, Nose, Mouth, and Throat Lips, teeth, and gums WNL.Marland Kitchen Moist mucosa without  lesions. Neck supple and nontender. No palpable supraclavicular or cervical adenopathy. Normal sized without goiter. Respiratory WNL. No retractions.. Cardiovascular Pedal Pulses WNL. No clubbing, cyanosis or edema. Lymphatic No adneopathy. No adenopathy. No adenopathy. Musculoskeletal Adexa without tenderness or enlargement.. Digits and nails w/o clubbing, cyanosis, infection, petechiae, ischemia, or inflammatory conditions.Marland Kitchen Psychiatric Judgement and insight Intact.. No evidence of depression, anxiety, or agitation.. General Notes: last week we had removed a large amount of necrotic debris with a blade and this time around using a forceps and scissors I was able to remove a lot of subcutaneous debris. No bleeding after debridement. Does not probe down to bone. Will continue to use Santyl ointment locally Integumentary (Hair, Skin) No suspicious lesions. No crepitus or fluctuance. No peri-wound warmth or erythema. No masses.. Wound #1 status is Open. Original cause of wound was Pressure Injury. The wound is located on the Sacrum. The wound measures 6.2cm length x 3cm width x 1.2cm depth; 14.608cm^2 area and 17.53cm^3 volume. There is Fat Layer (Subcutaneous Tissue) Exposed exposed. There is no tunneling or undermining noted. There is a large amount of serous drainage noted. The wound margin is distinct with the outline attached to the wound base. There is small (1-33%) red granulation within the wound bed. There is a large (67-100%) amount of necrotic tissue within the wound bed including Eschar and Adherent Slough. The periwound skin appearance exhibited: Maceration, Erythema. The surrounding wound skin color is noted with erythema which is circumferential. Periwound temperature was noted as No Abnormality. The periwound has tenderness on palpation. Assessment Active Problems ICD-10 L89.323 - Pressure ulcer of left buttock, stage 3 Theresa Frank, Theresa Frank. ([id number]) E44.0 - Moderate  protein-calorie malnutrition R62.7 - Adult failure to thrive I10 - Essential (primary) hypertension L89.154 - Pressure ulcer of sacral region, stage 4 Procedures Wound #1 Pre-procedure diagnosis of Wound #1 is a Pressure Ulcer located on the Sacrum . There was a Skin/Subcutaneous Tissue Debridement ([id number]) debridement with total area of 18.6 sq cm performed by Evlyn Kanner, MD. with the following instrument(s): Forceps and Scissors to remove Viable and Non-Viable tissue/material including Fibrin/Slough, Eschar, and Subcutaneous after achieving pain control using Lidocaine 4% Topical Solution. A time out was conducted at 13:02, prior to the start of the procedure. A Minimum amount of bleeding was controlled with Pressure. The patient tolerated the procedure with a pain level of 0 throughout and a pain level of 0 following the procedure. Post Debridement Measurements: 6.2cm length x 3cm width x 1.4cm depth; 20.452cm^3 volume. Post debridement Stage noted as Category/Stage III. Character of Wound/Ulcer Post Debridement is improved. Post  procedure Diagnosis Wound #1: Same as Pre-Procedure Plan Wound Cleansing: Wound #1 Sacrum: Clean wound with Normal Saline. Cleanse wound with mild soap and water May Shower, gently pat wound dry prior to applying new dressing. Anesthetic: Wound #1 Sacrum: Topical Lidocaine 4% cream applied to wound bed prior to debridement Skin Barriers/Peri-Wound Care: Wound #1 Sacrum: Skin Prep Primary Wound Dressing: Wound #1 Sacrum: Santyl Ointment Secondary Dressing: Wound #1 Sacrum: Dry Gauze Boardered Foam Dressing Dressing Change Frequency: Wound #1 Sacrum: Change dressing every day. Other: - as needed Follow-up Appointments: Wound #1 Sacrum: Return Appointment in 1 week. Off-Loading: Wound #1 Sacrum: Turn and reposition every 2 hours Theresa Frank, Theresa H. ([id number]) Additional Orders / Instructions: Wound #1 Sacrum: Increase protein  intake. Home Health: Wound #1 Sacrum: Continue Home Health Visits Home Health Nurse may visit PRN to address patient s wound care needs. FACE TO FACE ENCOUNTER: MEDICARE and MEDICAID PATIENTS: I certify that this patient is under my care and that I had a face-to-face encounter that meets the physician face-to-face encounter requirements with this patient on this date. The encounter with the patient was in whole or in part for the following MEDICAL CONDITION: (primary reason for Home Healthcare) MEDICAL NECESSITY: I certify, that based on my findings, NURSING services are a medically necessary home health service. HOME BOUND STATUS: I certify that my clinical findings support that this patient is homebound (i.e., Due to illness or injury, pt requires aid of supportive devices such as crutches, cane, wheelchairs, walkers, the use of special transportation or the assistance of another person to leave their place of residence. There is a normal inability to leave the home and doing so requires considerable and taxing effort. Other absences are for medical reasons / religious services and are infrequent or of short duration when for other reasons). If current dressing causes regression in wound condition, may D/C ordered dressing product/s and apply Normal Saline Moist Dressing daily until next Wound Healing Center / Other MD appointment. Notify Wound Healing Center of regression in wound condition at [phone]. Please direct any NON-WOUND related issues/requests for orders to patient's Primary Care Physician Medications-please add to medication list.: Wound #1 Sacrum: Other: - Vitamin C, Zinc, MVI and very detailed discussion with the patient's caregiver and answered all her questions and have recommended: 1. Santyl ointment locally to be applied daily. 2. I have discussed at length the need for proper offloading, 3. adequate protein, vitamin A, vitamin C and zinc and 4. appropriate surfaces  for her bed and wheelchair. we will see her at the wound center as often as it is possible to get her here. Electronic Signature(s) Signed: 07/22/2017 1:15:24 PM By: Evlyn Kanner MD, FACS Entered By: Evlyn Kanner on 07/22/2017 13:15:24 Theresa Frank ([id number]) -------------------------------------------------------------------------------- ROS/PFSH Details Patient Name: Theresa Frank Date of Service: 07/22/2017 12:30 PM Medical Record Number: [id number] Patient Account Number: [id number] Date of Birth/Sex: 1924/10/06 (83 y.o. Female) Treating RN: Curtis Sites Primary Care Provider: BABAOFF, MARCUS Other Clinician: Referring Provider: BABAOFF, MARCUS Treating Provider/Extender: Rudene Re in Treatment: 3 Unable to Obtain Patient History due to oo Dementia Information Obtained From Patient Wound History Do you currently have one or more open woundso Yes How many open wounds do you currently haveo 1 Approximately how long have you had your woundso 2 months How have you been treating your wound(s) until nowo bandage Has your wound(s) ever healed and then re-openedo No Have you had any lab work done in the past montho No Have  you tested positive for an antibiotic resistant organism (MRSA, VRE)o No Have you tested positive for osteomyelitis (bone infection)o No Immunizations Pneumococcal Vaccine: Received Pneumococcal Vaccination: No Implantable Devices Family and Social History Cancer: Yes - Mother; Diabetes: No; Heart Disease: Yes - Child; Hereditary Spherocytosis: No; Hypertension: No; Kidney Disease: No; Lung Disease: No; Seizures: No; Stroke: No; Thyroid Problems: No; Tuberculosis: No; Never smoker; Marital Status - Married; Alcohol Use: Never; Drug Use: No History; Caffeine Use: Never; Financial Concerns: No; Food, Clothing or Shelter Needs: No; Support System Lacking: No; Transportation Concerns: No; Advanced Directives: Yes (Not Provided); Patient does not  want information on Advanced Directives; Do not resuscitate: Yes (Not Provided); Living Will: No; Medical Power of Attorney: No Physician Affirmation I have reviewed and agree with the above information. Electronic Signature(s) Signed: 07/22/2017 4:27:13 PM By: Evlyn KannerBritto, Kaylin Schellenberg MD, FACS Signed: 07/22/2017 5:02:13 PM By: Curtis Sitesorthy, Joanna Entered By: Evlyn KannerBritto, Sanita Estrada on 07/22/2017 13:12:28 Theresa Frank, Theresa H. ([id number]) -------------------------------------------------------------------------------- SuperBill Details Patient Name: Theresa Frank, Theresa H. Date of Service: 07/22/2017 Medical Record Number: [id number] Patient Account Number: [id number] Date of Birth/Sex: 07/26/1925 (83 y.o. Female) Treating RN: Curtis Sitesorthy, Joanna Primary Care Provider: BABAOFF, MARCUS Other Clinician: Referring Provider: BABAOFF, MARCUS Treating Provider/Extender: Rudene ReBritto, Sigfredo Schreier Weeks in Treatment: 3 Diagnosis Coding ICD-10 Codes Code Description [phone] Pressure ulcer of left buttock, stage 3 E44.0 Moderate protein-calorie malnutrition R62.7 Adult failure to thrive I10 Essential (primary) hypertension L89.154 Pressure ulcer of sacral region, stage 4 Facility Procedures CPT4 Code: [id number] Description: 11042 - DEB SUBQ TISSUE 20 SQ CM/< ICD-10 Diagnosis Description I10 Essential (primary) hypertension E44.0 Moderate protein-calorie malnutrition R62.7 Adult failure to thrive L89.154 Pressure ulcer of sacral region, stage 4 Modifier: Quantity: 1 Physician Procedures CPT4 Code: [id number]: [id number] Description: 11042 - WC PHYS SUBQ TISS 20 SQ CM ICD-10 Diagnosis Description I10 Essential (primary) hypertension E44.0 Moderate protein-calorie malnutrition R62.7 Adult failure to thrive L89.154 Pressure ulcer of sacral region, stage 4 Modifier: Quantity: 1 Electronic Signature(s) Signed: 07/22/2017 1:15:42 PM By: Evlyn KannerBritto, Janielle Mittelstadt MD, FACS Entered By: Evlyn KannerBritto, Lakyn Mantione on 07/22/2017 13:15:42

## 2017-08-08 ENCOUNTER — Encounter: Attending: Physician Assistant | Admitting: Physician Assistant

## 2017-08-08 DIAGNOSIS — I10 Essential (primary) hypertension: Secondary | ICD-10-CM | POA: Insufficient documentation

## 2017-08-08 DIAGNOSIS — G309 Alzheimer's disease, unspecified: Secondary | ICD-10-CM | POA: Diagnosis not present

## 2017-08-08 DIAGNOSIS — L89890 Pressure ulcer of other site, unstageable: Secondary | ICD-10-CM | POA: Insufficient documentation

## 2017-08-08 DIAGNOSIS — F0280 Dementia in other diseases classified elsewhere without behavioral disturbance: Secondary | ICD-10-CM | POA: Diagnosis not present

## 2017-08-08 DIAGNOSIS — R627 Adult failure to thrive: Secondary | ICD-10-CM | POA: Insufficient documentation

## 2017-08-08 DIAGNOSIS — L89323 Pressure ulcer of left buttock, stage 3: Secondary | ICD-10-CM | POA: Diagnosis not present

## 2017-08-08 DIAGNOSIS — L89154 Pressure ulcer of sacral region, stage 4: Secondary | ICD-10-CM | POA: Insufficient documentation

## 2017-08-08 DIAGNOSIS — L89620 Pressure ulcer of left heel, unstageable: Secondary | ICD-10-CM | POA: Diagnosis not present

## 2017-08-08 DIAGNOSIS — E440 Moderate protein-calorie malnutrition: Secondary | ICD-10-CM | POA: Diagnosis not present

## 2017-08-08 DIAGNOSIS — Z993 Dependence on wheelchair: Secondary | ICD-10-CM | POA: Diagnosis not present

## 2017-08-09 NOTE — Progress Notes (Signed)
AFTAN, VINT ([id number]) Visit Report for 08/08/2017 Arrival Information Details Patient Name: Theresa Frank, Theresa Frank. Date of Service: 08/08/2017 12:30 PM Medical Record Number: [id number] Patient Account Number: [id number] Date of Birth/Sex: April 03, 1925 (83 y.o. Female) Treating RN: Curtis Sites Primary Care Midas Daughety: BABAOFF, MARCUS Other Clinician: Referring Jadakiss Barish: BABAOFF, MARCUS Treating Shirleymae Hauth/Extender: STONE III, HOYT Weeks in Treatment: 5 Visit Information History Since Last Visit Added or deleted any medications: No Patient Arrived: Wheel Chair Any new allergies or adverse reactions: No Arrival Time: 12:45 Had a fall or experienced change in No activities of daily living that may affect Accompanied By: caregiver risk of falls: Transfer Assistance: Manual Signs or symptoms of abuse/neglect since last visito No Patient Identification Verified: Yes Hospitalized since last visit: No Secondary Verification Process Completed: Yes Has Dressing in Place as Prescribed: Yes Patient Requires Transmission-Based No Pain Present Now: No Precautions: Patient Has Alerts: No Electronic Signature(s) Signed: 08/08/2017 4:00:26 PM By: Curtis Sites Entered By: Curtis Sites on 08/08/2017 12:46:11 Theresa Frank ([id number]) -------------------------------------------------------------------------------- Clinic Level of Care Assessment Details Patient Name: Theresa Frank Date of Service: 08/08/2017 12:30 PM Medical Record Number: [id number] Patient Account Number: [id number] Date of Birth/Sex: 13-Dec-1924 (83 y.o. Female) Treating RN: Curtis Sites Primary Care Mccall Lomax: BABAOFF, MARCUS Other Clinician: Referring Amish Mintzer: BABAOFF, MARCUS Treating Remy Dia/Extender: STONE III, HOYT Weeks in Treatment: 5 Clinic Level of Care Assessment Items TOOL 4 Quantity Score []  - Use when only an EandM is performed on FOLLOW-UP visit 0 ASSESSMENTS - Nursing Assessment / Reassessment X -  Reassessment of Co-morbidities (includes updates in patient status) 1 10 X- 1 5 Reassessment of Adherence to Treatment Plan ASSESSMENTS - Wound and Skin Assessment / Reassessment []  - Simple Wound Assessment / Reassessment - one wound 0 X- 4 5 Complex Wound Assessment / Reassessment - multiple wounds []  - 0 Dermatologic / Skin Assessment (not related to wound area) ASSESSMENTS - Focused Assessment []  - Circumferential Edema Measurements - multi extremities 0 []  - 0 Nutritional Assessment / Counseling / Intervention X- 1 5 Lower Extremity Assessment (monofilament, tuning fork, pulses) []  - 0 Peripheral Arterial Disease Assessment (using hand held doppler) ASSESSMENTS - Ostomy and/or Continence Assessment and Care []  - Incontinence Assessment and Management 0 []  - 0 Ostomy Care Assessment and Management (repouching, etc.) PROCESS - Coordination of Care X - Simple Patient / Family Education for ongoing care 1 15 []  - 0 Complex (extensive) Patient / Family Education for ongoing care []  - 0 Staff obtains Chiropractor, Records, Test Results / Process Orders []  - 0 Staff telephones HHA, Nursing Homes / Clarify orders / etc []  - 0 Routine Transfer to another Facility (non-emergent condition) []  - 0 Routine Hospital Admission (non-emergent condition) []  - 0 New Admissions / Manufacturing engineer / Ordering NPWT, Apligraf, etc. []  - 0 Emergency Hospital Admission (emergent condition) X- 1 10 Simple Discharge Coordination Theresa Frank, Theresa Frank ([id number]) []  - 0 Complex (extensive) Discharge Coordination PROCESS - Special Needs []  - Pediatric / Minor Patient Management 0 []  - 0 Isolation Patient Management []  - 0 Hearing / Language / Visual special needs []  - 0 Assessment of Community assistance (transportation, D/C planning, etc.) []  - 0 Additional assistance / Altered mentation []  - 0 Support Surface(s) Assessment (bed, cushion, seat, etc.) INTERVENTIONS - Wound Cleansing /  Measurement []  - Simple Wound Cleansing - one wound 0 X- 4 5 Complex Wound Cleansing - multiple wounds X- 1 5 Wound Imaging (photographs - any number of wounds) []  - 0 Wound Tracing (  instead of photographs) []  - 0 Simple Wound Measurement - one wound X- 4 5 Complex Wound Measurement - multiple wounds INTERVENTIONS - Wound Dressings X - Small Wound Dressing one or multiple wounds 4 10 []  - 0 Medium Wound Dressing one or multiple wounds []  - 0 Large Wound Dressing one or multiple wounds []  - 0 Application of Medications - topical []  - 0 Application of Medications - injection INTERVENTIONS - Miscellaneous []  - External ear exam 0 []  - 0 Specimen Collection (cultures, biopsies, blood, body fluids, etc.) []  - 0 Specimen(s) / Culture(s) sent or taken to Lab for analysis []  - 0 Patient Transfer (multiple staff / Nurse, adult / Similar devices) []  - 0 Simple Staple / Suture removal (25 or less) []  - 0 Complex Staple / Suture removal (26 or more) []  - 0 Hypo / Hyperglycemic Management (close monitor of Blood Glucose) []  - 0 Ankle / Brachial Index (ABI) - do not check if billed separately X- 1 5 Vital Signs Theresa Frank, Theresa H. ([id number]) Has the patient been seen at the hospital within the last three years: Yes Total Score: 155 Level Of Care: New/Established - Level 4 Electronic Signature(s) Signed: 08/08/2017 4:00:26 PM By: Curtis Sites Entered By: Curtis Sites on 08/08/2017 14:24:48 Theresa Frank ([id number]) -------------------------------------------------------------------------------- Encounter Discharge Information Details Patient Name: Theresa Frank Date of Service: 08/08/2017 12:30 PM Medical Record Number: [id number] Patient Account Number: [id number] Date of Birth/Sex: 29-Mar-1925 (83 y.o. Female) Treating RN: Curtis Sites Primary Care Andrews Tener: BABAOFF, MARCUS Other Clinician: Referring Jerzie Bieri: BABAOFF, MARCUS Treating Cherry Turlington/Extender: STONE III,  HOYT Weeks in Treatment: 5 Encounter Discharge Information Items Discharge Pain Level: 0 Discharge Condition: Stable Ambulatory Status: Wheelchair Discharge Destination: Home Transportation: Private Auto Accompanied By: caregiver Schedule Follow-up Appointment: Yes Medication Reconciliation completed and No provided to Patient/Care Murdis Flitton: Provided on Clinical Summary of Care: 08/08/2017 Form Type Recipient Paper Patient RZ Electronic Signature(s) Signed: 08/08/2017 4:00:26 PM By: Curtis Sites Entered By: Curtis Sites on 08/08/2017 14:25:58 Theresa Frank ([id number]) -------------------------------------------------------------------------------- Lower Extremity Assessment Details Patient Name: Theresa Frank Date of Service: 08/08/2017 12:30 PM Medical Record Number: [id number] Patient Account Number: [id number] Date of Birth/Sex: 07/24/1925 (83 y.o. Female) Treating RN: Curtis Sites Primary Care Lachelle Rissler: BABAOFF, MARCUS Other Clinician: Referring Trevell Pariseau: BABAOFF, MARCUS Treating Junie Avilla/Extender: STONE III, HOYT Weeks in Treatment: 5 Vascular Assessment Pulses: Dorsalis Pedis Palpable: [Left:Yes] [Right:Yes] Posterior Tibial Extremity colors, hair growth, and conditions: Extremity Color: [Left:Normal] [Right:Normal] Hair Growth on Extremity: [Left:No] [Right:No] Temperature of Extremity: [Left:Warm] [Right:Warm] Capillary Refill: [Left:< 3 seconds] [Right:< 3 seconds] Electronic Signature(s) Signed: 08/08/2017 4:00:26 PM By: Curtis Sites Entered By: Curtis Sites on 08/08/2017 13:03:16 Theresa Frank ([id number]) -------------------------------------------------------------------------------- Multi Wound Chart Details Patient Name: Theresa Frank Date of Service: 08/08/2017 12:30 PM Medical Record Number: [id number] Patient Account Number: [id number] Date of Birth/Sex: 12/07/1924 (83 y.o. Female) Treating RN: Curtis Sites Primary Care Mayzie Caughlin:  BABAOFF, MARCUS Other Clinician: Referring Kinsly Hild: BABAOFF, MARCUS Treating Oreoluwa Gilmer/Extender: STONE III, HOYT Weeks in Treatment: 5 Vital Signs Height(in): 68 Pulse(bpm): 88 Weight(lbs): 108 Blood Pressure(mmHg): 127/81 Body Mass Index(BMI): 16 Temperature(F): 98.1 Respiratory Rate 18 (breaths/min): Photos: [1:No Photos] [2:No Photos] [3:No Photos] Wound Location: [1:Sacrum] [2:Left Ischium] [3:Left Calcaneus] Wounding Event: [1:Pressure Injury] [2:Gradually Appeared] [3:Gradually Appeared] Primary Etiology: [1:Pressure Ulcer] [2:Pressure Ulcer] [3:Pressure Ulcer] Date Acquired: [1:05/01/2017] [2:08/01/2017] [3:08/01/2017] Weeks of Treatment: [1:5] [2:0] [3:0] Wound Status: [1:Open] [2:Open] [3:Open] Measurements L x W x D [1:6x2.2x2] [2:1.6x2.3x0.1] [3:1.9x4.2x0.2] (cm) Area (cm) : [1:10.367] [2:2.89] [3:6.267] Volume (cm) : [  1:20.735] [2:0.289] [3:1.253] % Reduction in Area: [1:37.40%] [2:N/A] [3:N/A] % Reduction in Volume: [1:-213.10%] [2:N/A] [3:N/A] Starting Position 1 [1:8] (o'clock): Ending Position 1 [1:5] (o'clock): Maximum Distance 1 (cm): [1:1.8] Undermining: [1:Yes] [2:No] [3:No] Classification: [1:Category/Stage III] [2:Category/Stage III] [3:Category/Stage III] Exudate Amount: [1:Large] [2:Large] [3:Large] Exudate Type: [1:Serous] [2:Serous] [3:Serous] Exudate Color: [1:amber] [2:amber] [3:amber] Wound Margin: [1:Distinct, outline attached] [2:Flat and Intact] [3:Flat and Intact] Granulation Amount: [1:Medium (34-66%)] [2:Medium (34-66%)] [3:None Present (0%)] Granulation Quality: [1:Red] [2:Pink] [3:N/A] Necrotic Amount: [1:Medium (34-66%)] [2:Medium (34-66%)] [3:Large (67-100%)] Necrotic Tissue: [1:Eschar, Adherent Slough] [2:Adherent Slough] [3:Eschar, Adherent Slough] Exposed Structures: [1:Fat Layer (Subcutaneous Tissue) Exposed: Yes Fascia: No Tendon: No Muscle: No Joint: No Bone: No] [2:Fat Layer (Subcutaneous Tissue) Exposed: Yes Fascia: No  Tendon: No Muscle: No Joint: No Bone: No] [3:Fat Layer (Subcutaneous Tissue) Exposed: Yes  Fascia: No Tendon: No Muscle: No Joint: No Bone: No] Epithelialization: [1:None] [2:None] [3:None] Periwound Skin Texture: [1:No Abnormalities Noted] Excoriation: No Excoriation: No Induration: No Induration: No Callus: No Callus: No Crepitus: No Crepitus: No Rash: No Rash: No Scarring: No Scarring: No Periwound Skin Moisture: Maceration: Yes Maceration: No Maceration: No Dry/Scaly: No Dry/Scaly: No Periwound Skin Color: Erythema: Yes Atrophie Blanche: No Atrophie Blanche: No Cyanosis: No Cyanosis: No Ecchymosis: No Ecchymosis: No Erythema: No Erythema: No Hemosiderin Staining: No Hemosiderin Staining: No Mottled: No Mottled: No Pallor: No Pallor: No Rubor: No Rubor: No Erythema Location: Circumferential N/A N/A Temperature: No Abnormality No Abnormality No Abnormality Tenderness on Palpation: Yes Yes Yes Wound Preparation: Ulcer Cleansing: Ulcer Cleansing: Ulcer Cleansing: Rinsed/Irrigated with Saline Rinsed/Irrigated with Saline Rinsed/Irrigated with Saline Topical Anesthetic Applied: Topical Anesthetic Applied: Topical Anesthetic Applied: Other: lidocaine 4% Other: lidocaine 4% Other: lidocaine 4% Wound Number: 4 N/A N/A Photos: No Photos N/A N/A Wound Location: Right Foot - Lateral N/A N/A Wounding Event: Gradually Appeared N/A N/A Primary Etiology: Pressure Ulcer N/A N/A Date Acquired: 08/01/2017 N/A N/A Weeks of Treatment: 0 N/A N/A Wound Status: Open N/A N/A Measurements L x W x D 0.5x0.5x0.2 N/A N/A (cm) Area (cm) : 0.196 N/A N/A Volume (cm) : 0.039 N/A N/A % Reduction in Area: N/A N/A N/A % Reduction in Volume: N/A N/A N/A Undermining: No N/A N/A Classification: Category/Stage III N/A N/A Exudate Amount: Large N/A N/A Exudate Type: Serous N/A N/A Exudate Color: amber N/A N/A Wound Margin: Flat and Intact N/A N/A Granulation Amount: Small  (1-33%) N/A N/A Granulation Quality: Pink N/A N/A Necrotic Amount: Large (67-100%) N/A N/A Necrotic Tissue: Eschar, Adherent Slough N/A N/A Exposed Structures: Fat Layer (Subcutaneous N/A N/A Tissue) Exposed: Yes Fascia: No Tendon: No Muscle: No Joint: No Bone: No Epithelialization: None N/A N/A Theresa Frank, Theresa H. ([id number]) Periwound Skin Texture: Excoriation: No N/A N/A Induration: No Callus: No Crepitus: No Rash: No Scarring: No Periwound Skin Moisture: Maceration: No N/A N/A Dry/Scaly: No Periwound Skin Color: Atrophie Blanche: No N/A N/A Cyanosis: No Ecchymosis: No Erythema: No Hemosiderin Staining: No Mottled: No Pallor: No Rubor: No Erythema Location: N/A N/A N/A Temperature: No Abnormality N/A N/A Tenderness on Palpation: Yes N/A N/A Wound Preparation: Ulcer Cleansing: N/A N/A Rinsed/Irrigated with Saline Topical Anesthetic Applied: Other: lidocaine 4% Treatment Notes Electronic Signature(s) Signed: 08/08/2017 4:00:26 PM By: Curtis Sitesorthy, Joanna Entered By: Curtis Sitesorthy, Joanna on 08/08/2017 13:11:10 Theresa Frank, Theresa H. ([id number]) -------------------------------------------------------------------------------- Multi-Disciplinary Care Plan Details Patient Name: Theresa Frank, Theresa H. Date of Service: 08/08/2017 12:30 PM Medical Record Number: [id number] Patient Account Number: [id number] Date of Birth/Sex: 12/06/1924 (83 y.o. Female) Treating RN: Curtis Sitesorthy, Joanna Primary Care Lunden Stieber:  BABAOFF, MARCUS Other Clinician: Referring Elfie Costanza: BABAOFF, MARCUS Treating Mikeila Burgen/Extender: STONE III, HOYT Weeks in Treatment: 5 Active Inactive ` Abuse / Safety / Falls / Self Care Management Nursing Diagnoses: History of Falls Potential for falls Goals: Patient will not experience any injury related to falls Date Initiated: 07/01/2017 Target Resolution Date: 10/15/2017 Goal Status: Active Patient will remain injury free related to falls Date Initiated: 07/01/2017 Target Resolution  Date: 10/15/2017 Goal Status: Active Interventions: Assess fall risk on admission and as needed Assess: immobility, friction, shearing, incontinence upon admission and as needed Assess impairment of mobility on admission and as needed per policy Assess self care needs on admission and as needed Notes: ` Nutrition Nursing Diagnoses: Imbalanced nutrition Potential for alteratiion in Nutrition/Potential for imbalanced nutrition Goals: Patient/caregiver agrees to and verbalizes understanding of need to use nutritional supplements and/or vitamins as prescribed Date Initiated: 07/01/2017 Target Resolution Date: 10/15/2017 Goal Status: Active Interventions: Assess patient nutrition upon admission and as needed per policy Notes: ` Orientation to the Wound Care Program [place] ([id number]) Nursing Diagnoses: Knowledge deficit related to the wound healing center program Goals: Patient/caregiver will verbalize understanding of the Wound Healing Center Program Date Initiated: 07/01/2017 Target Resolution Date: 07/16/2017 Goal Status: Active Interventions: Provide education on orientation to the wound center Notes: ` Pain, Acute or Chronic Nursing Diagnoses: Pain, acute or chronic: actual or potential Potential alteration in comfort, pain Goals: Patient/caregiver will verbalize adequate pain control between visits Date Initiated: 07/01/2017 Target Resolution Date: 09-11-17 Goal Status: Active Interventions: Complete pain assessment as per visit requirements Reposition patient for comfort Notes: ` Pressure Nursing Diagnoses: Knowledge deficit related to causes and risk factors for pressure ulcer development Knowledge deficit related to management of pressures ulcers Potential for impaired tissue integrity related to pressure, friction, moisture, and shear Goals: Patient will remain free from development of additional pressure ulcers Date Initiated: 07/01/2017 Target  Resolution Date: 10/15/2017 Goal Status: Active Interventions: Assess: immobility, friction, shearing, incontinence upon admission and as needed Assess offloading mechanisms upon admission and as needed Assess potential for pressure ulcer upon admission and as needed Provide education on pressure ulcers Notes: Theresa Frank, Theresa Frank ([id number]) Wound/Skin Impairment Nursing Diagnoses: Impaired tissue integrity Knowledge deficit related to ulceration/compromised skin integrity Goals: Ulcer/skin breakdown will have a volume reduction of 80% by week 12 Date Initiated: 07/01/2017 Target Resolution Date: 10/15/2017 Goal Status: Active Interventions: Assess patient/caregiver ability to perform ulcer/skin care regimen upon admission and as needed Assess ulceration(s) every visit Notes: Electronic Signature(s) Signed: 08/08/2017 4:00:26 PM By: Curtis Sites Entered By: Curtis Sites on 08/08/2017 13:10:57 Theresa Frank ([id number]) -------------------------------------------------------------------------------- Pain Assessment Details Patient Name: Theresa Frank Date of Service: 08/08/2017 12:30 PM Medical Record Number: [id number] Patient Account Number: [id number] Date of Birth/Sex: October 14, 1924 (83 y.o. Female) Treating RN: Curtis Sites Primary Care Kevan Prouty: BABAOFF, MARCUS Other Clinician: Referring Krystel Fletchall: BABAOFF, MARCUS Treating Lynnita Somma/Extender: STONE III, HOYT Weeks in Treatment: 5 Active Problems Location of Pain Severity and Description of Pain Patient Has Paino No Site Locations Pain Management and Medication Current Pain Management: Notes Topical or injectable lidocaine is offered to patient for acute pain when surgical debridement is performed. If needed, Patient is instructed to use over the counter pain medication for the following 24-48 hours after debridement. Wound care MDs do not prescribed pain medications. Patient has chronic pain or uncontrolled pain.  Patient has been instructed to make an appointment with their Primary Care Physician for pain management. Electronic Signature(s) Signed: 08/08/2017 4:00:26 PM By: Francesco Sor,  Mardene Celeste Entered By: Curtis Sites on 08/08/2017 12:46:21 Theresa Frank ([id number]) -------------------------------------------------------------------------------- Patient/Caregiver Education Details Patient Name: Theresa Frank Date of Service: 08/08/2017 12:30 PM Medical Record Number: [id number] Patient Account Number: [id number] Date of Birth/Gender: June 05, 1925 (84 y.o. Female) Treating RN: Curtis Sites Primary Care Physician: BABAOFF, MARCUS Other Clinician: Referring Physician: BABAOFF, MARCUS Treating Physician/Extender: Linwood Dibbles, HOYT Weeks in Treatment: 5 Education Assessment Education Provided To: Caregiver Education Topics Provided Wound/Skin Impairment: Handouts: Other: wound care as ordered Methods: Demonstration, Explain/Verbal Responses: State content correctly Electronic Signature(s) Signed: 08/08/2017 4:00:26 PM By: Curtis Sites Entered By: Curtis Sites on 08/08/2017 14:26:18 Theresa Frank ([id number]) -------------------------------------------------------------------------------- Wound Assessment Details Patient Name: Theresa Frank. Date of Service: 08/08/2017 12:30 PM Medical Record Number: [id number] Patient Account Number: [id number] Date of Birth/Sex: 1925-06-22 (83 y.o. Female) Treating RN: Curtis Sites Primary Care Chinedu Agustin: BABAOFF, MARCUS Other Clinician: Referring Shenee Wignall: BABAOFF, MARCUS Treating Shastina Rua/Extender: STONE III, HOYT Weeks in Treatment: 5 Wound Status Wound Number: 1 Primary Etiology: Pressure Ulcer Wound Location: Sacrum Wound Status: Open Wounding Event: Pressure Injury Date Acquired: 05/01/2017 Weeks Of Treatment: 5 Clustered Wound: No Photos Photo Uploaded By: Curtis Sites on 08/08/2017 15:47:48 Wound Measurements Length: (cm) 6 Width:  (cm) 2.2 Depth: (cm) 2 Area: (cm) 10.367 Volume: (cm) 20.735 % Reduction in Area: 37.4% % Reduction in Volume: -213.1% Epithelialization: None Tunneling: No Undermining: Yes Starting Position (o'clock): 8 Ending Position (o'clock): 5 Maximum Distance: (cm) 1.8 Wound Description Classification: Category/Stage III Wound Margin: Distinct, outline attached Exudate Amount: Large Exudate Type: Serous Exudate Color: amber Foul Odor After Cleansing: No Slough/Fibrino Yes Wound Bed Granulation Amount: Medium (34-66%) Exposed Structure Granulation Quality: Red Fascia Exposed: No Necrotic Amount: Medium (34-66%) Fat Layer (Subcutaneous Tissue) Exposed: Yes Necrotic Quality: Eschar, Adherent Slough Tendon Exposed: No Muscle Exposed: No Theresa Frank, Theresa Frank ([id number]) Joint Exposed: No Bone Exposed: No Periwound Skin Texture Texture Color No Abnormalities Noted: No No Abnormalities Noted: No Erythema: Yes Moisture Erythema Location: Circumferential No Abnormalities Noted: No Maceration: Yes Temperature / Pain Temperature: No Abnormality Tenderness on Palpation: Yes Wound Preparation Ulcer Cleansing: Rinsed/Irrigated with Saline Topical Anesthetic Applied: Other: lidocaine 4%, Treatment Notes Wound #1 (Sacrum) 1. Cleansed with: Clean wound with Normal Saline 2. Anesthetic Topical Lidocaine 4% cream to wound bed prior to debridement 4. Dressing Applied: Other dressing (specify in notes) 5. Secondary Dressing Applied Bordered Foam Dressing Dry Gauze Notes silvercel Electronic Signature(s) Signed: 08/08/2017 4:00:26 PM By: Curtis Sites Entered By: Curtis Sites on 08/08/2017 12:59:56 Theresa Frank ([id number]) -------------------------------------------------------------------------------- Wound Assessment Details Patient Name: Theresa Frank Date of Service: 08/08/2017 12:30 PM Medical Record Number: [id number] Patient Account Number: [id number] Date of  Birth/Sex: December 14, 1924 (83 y.o. Female) Treating RN: Curtis Sites Primary Care Srijan Givan: BABAOFF, MARCUS Other Clinician: Referring Shahad Mazurek: BABAOFF, MARCUS Treating Victorhugo Preis/Extender: STONE III, HOYT Weeks in Treatment: 5 Wound Status Wound Number: 2 Primary Etiology: Pressure Ulcer Wound Location: Left Ischium Wound Status: Open Wounding Event: Gradually Appeared Date Acquired: 08/01/2017 Weeks Of Treatment: 0 Clustered Wound: No Photos Photo Uploaded By: Curtis Sites on 08/08/2017 15:47:49 Wound Measurements Length: (cm) 1.6 Width: (cm) 2.3 Depth: (cm) 0.1 Area: (cm) 2.89 Volume: (cm) 0.289 % Reduction in Area: % Reduction in Volume: Epithelialization: None Tunneling: No Undermining: No Wound Description Classification: Category/Stage III Wound Margin: Flat and Intact Exudate Amount: Large Exudate Type: Serous Exudate Color: amber Foul Odor After Cleansing: No Slough/Fibrino Yes Wound Bed Granulation Amount: Medium (34-66%) Exposed Structure Granulation Quality: Pink Fascia Exposed: No Necrotic Amount: Medium (34-66%)  Fat Layer (Subcutaneous Tissue) Exposed: Yes Necrotic Quality: Adherent Slough Tendon Exposed: No Muscle Exposed: No Joint Exposed: No Bone Exposed: No Periwound Skin Texture Theresa Frank, Theresa H. ([id number]) Texture Color No Abnormalities Noted: No No Abnormalities Noted: No Callus: No Atrophie Blanche: No Crepitus: No Cyanosis: No Excoriation: No Ecchymosis: No Induration: No Erythema: No Rash: No Hemosiderin Staining: No Scarring: No Mottled: No Pallor: No Moisture Rubor: No No Abnormalities Noted: No Dry / Scaly: No Temperature / Pain Maceration: No Temperature: No Abnormality Tenderness on Palpation: Yes Wound Preparation Ulcer Cleansing: Rinsed/Irrigated with Saline Topical Anesthetic Applied: Other: lidocaine 4%, Treatment Notes Wound #2 (Left Ischium) 1. Cleansed with: Clean wound with Normal Saline 2.  Anesthetic Topical Lidocaine 4% cream to wound bed prior to debridement 4. Dressing Applied: Santyl Ointment 5. Secondary Dressing Applied Bordered Foam Dressing Dry Gauze Electronic Signature(s) Signed: 08/08/2017 4:00:26 PM By: Curtis Sites Entered By: Curtis Sites on 08/08/2017 12:57:50 Theresa Frank ([id number]) -------------------------------------------------------------------------------- Wound Assessment Details Patient Name: Theresa Frank Date of Service: 08/08/2017 12:30 PM Medical Record Number: [id number] Patient Account Number: [id number] Date of Birth/Sex: Aug 18, 1925 (83 y.o. Female) Treating RN: Curtis Sites Primary Care Trevel Dillenbeck: BABAOFF, MARCUS Other Clinician: Referring Ramon Zanders: BABAOFF, MARCUS Treating Nadyne Gariepy/Extender: STONE III, HOYT Weeks in Treatment: 5 Wound Status Wound Number: 3 Primary Etiology: Pressure Ulcer Wound Location: Left Calcaneus Wound Status: Open Wounding Event: Gradually Appeared Date Acquired: 08/01/2017 Weeks Of Treatment: 0 Clustered Wound: No Photos Photo Uploaded By: Curtis Sites on 08/08/2017 15:48:26 Wound Measurements Length: (cm) 1.9 Width: (cm) 4.2 Depth: (cm) 0.2 Area: (cm) 6.267 Volume: (cm) 1.253 % Reduction in Area: % Reduction in Volume: Epithelialization: None Tunneling: No Undermining: No Wound Description Classification: Category/Stage III Wound Margin: Flat and Intact Exudate Amount: Large Exudate Type: Serous Exudate Color: amber Foul Odor After Cleansing: No Slough/Fibrino Yes Wound Bed Granulation Amount: None Present (0%) Exposed Structure Necrotic Amount: Large (67-100%) Fascia Exposed: No Necrotic Quality: Eschar, Adherent Slough Fat Layer (Subcutaneous Tissue) Exposed: Yes Tendon Exposed: No Muscle Exposed: No Joint Exposed: No Bone Exposed: No Periwound Skin Texture Whitwell, Jeidi H. ([id number]) Texture Color No Abnormalities Noted: No No Abnormalities Noted: No Callus:  No Atrophie Blanche: No Crepitus: No Cyanosis: No Excoriation: No Ecchymosis: No Induration: No Erythema: No Rash: No Hemosiderin Staining: No Scarring: No Mottled: No Pallor: No Moisture Rubor: No No Abnormalities Noted: No Dry / Scaly: No Temperature / Pain Maceration: No Temperature: No Abnormality Tenderness on Palpation: Yes Wound Preparation Ulcer Cleansing: Rinsed/Irrigated with Saline Topical Anesthetic Applied: Other: lidocaine 4%, Treatment Notes Wound #3 (Left Calcaneus) 1. Cleansed with: Clean wound with Normal Saline 2. Anesthetic Topical Lidocaine 4% cream to wound bed prior to debridement 4. Dressing Applied: Santyl Ointment 5. Secondary Dressing Applied Bordered Foam Dressing Dry Gauze Electronic Signature(s) Signed: 08/08/2017 4:00:26 PM By: Curtis Sites Entered By: Curtis Sites on 08/08/2017 13:01:09 Theresa Frank ([id number]) -------------------------------------------------------------------------------- Wound Assessment Details Patient Name: Theresa Frank Date of Service: 08/08/2017 12:30 PM Medical Record Number: [id number] Patient Account Number: [id number] Date of Birth/Sex: 1925/05/01 (83 y.o. Female) Treating RN: Curtis Sites Primary Care Demetri Kerman: BABAOFF, MARCUS Other Clinician: Referring Mayda Shippee: BABAOFF, MARCUS Treating Heavan Francom/Extender: STONE III, HOYT Weeks in Treatment: 5 Wound Status Wound Number: 4 Primary Etiology: Pressure Ulcer Wound Location: Right Foot - Lateral Wound Status: Open Wounding Event: Gradually Appeared Date Acquired: 08/01/2017 Weeks Of Treatment: 0 Clustered Wound: No Photos Photo Uploaded By: Curtis Sites on 08/08/2017 15:48:27 Wound Measurements Length: (cm) 0.5 Width: (cm) 0.5 Depth: (cm)  0.2 Area: (cm) 0.196 Volume: (cm) 0.039 % Reduction in Area: % Reduction in Volume: Epithelialization: None Tunneling: No Undermining: No Wound Description Classification: Category/Stage  III Wound Margin: Flat and Intact Exudate Amount: Large Exudate Type: Serous Exudate Color: amber Foul Odor After Cleansing: No Slough/Fibrino Yes Wound Bed Granulation Amount: Small (1-33%) Exposed Structure Granulation Quality: Pink Fascia Exposed: No Necrotic Amount: Large (67-100%) Fat Layer (Subcutaneous Tissue) Exposed: Yes Necrotic Quality: Eschar, Adherent Slough Tendon Exposed: No Muscle Exposed: No Joint Exposed: No Bone Exposed: No Periwound Skin Texture [place], Leslie H. ([id number]) Texture Color No Abnormalities Noted: No No Abnormalities Noted: No Callus: No Atrophie Blanche: No Crepitus: No Cyanosis: No Excoriation: No Ecchymosis: No Induration: No Erythema: No Rash: No Hemosiderin Staining: No Scarring: No Mottled: No Pallor: No Moisture Rubor: No No Abnormalities Noted: No Dry / Scaly: No Temperature / Pain Maceration: No Temperature: No Abnormality Tenderness on Palpation: Yes Wound Preparation Ulcer Cleansing: Rinsed/Irrigated with Saline Topical Anesthetic Applied: Other: lidocaine 4%, Treatment Notes Wound #4 (Right, Lateral Foot) 1. Cleansed with: Clean wound with Normal Saline 2. Anesthetic Topical Lidocaine 4% cream to wound bed prior to debridement 4. Dressing Applied: Santyl Ointment 5. Secondary Dressing Applied Bordered Foam Dressing Dry Gauze Electronic Signature(s) Signed: 08/08/2017 4:00:26 PM By: Curtis Sites Entered By: Curtis Sites on 08/08/2017 13:02:29 Theresa Frank ([id number]) -------------------------------------------------------------------------------- Vitals Details Patient Name: Theresa Frank Date of Service: 08/08/2017 12:30 PM Medical Record Number: [id number] Patient Account Number: [id number] Date of Birth/Sex: [birth year] (83 y.o. Female) Treating RN: Curtis Sites Primary Care Zeddie Njie: BABAOFF, MARCUS Other Clinician: Referring Samina Weekes: BABAOFF, MARCUS Treating Tray Klayman/Extender: STONE III,  HOYT Weeks in Treatment: 5 Vital Signs Time Taken: 12:46 Temperature (F): 98.1 Height (in): 68 Pulse (bpm): 88 Weight (lbs): 108 Respiratory Rate (breaths/min): 18 Body Mass Index (BMI): 16.4 Blood Pressure (mmHg): 127/81 Reference Range: 80 - 120 mg / dl Electronic Signature(s) Signed: 08/08/2017 4:00:26 PM By: Curtis Sites Entered By: Curtis Sites on 08/08/2017 12:49:00

## 2017-08-09 NOTE — Progress Notes (Signed)
Theresa Frank, Theresa Frank ([id number]) Visit Report for 08/08/2017 Chief Complaint Document Details Patient Name: Theresa Frank, Theresa Frank. Date of Service: 08/08/2017 12:30 PM Medical Record Number: [id number] Patient Account Number: [id number] Date of Birth/Sex: 1924/09/12 (83 y.o. Female) Treating RN: Curtis Sites Primary Care Provider: BABAOFF, MARCUS Other Clinician: Referring Provider: BABAOFF, MARCUS Treating Provider/Extender: Linwood Dibbles, HOYT Weeks in Treatment: 5 Information Obtained from: Patient Chief Complaint Left Gluteal Ulcer Electronic Signature(s) Signed: 08/08/2017 5:27:57 PM By: Lenda Kelp PA-C Entered By: Lenda Kelp on 08/08/2017 13:06:31 Theresa Frank ([id number]) -------------------------------------------------------------------------------- HPI Details Patient Name: Theresa Frank Date of Service: 08/08/2017 12:30 PM Medical Record Number: [id number] Patient Account Number: [id number] Date of Birth/Sex: [birth year] (83 y.o. Female) Treating RN: Curtis Sites Primary Care Provider: Kandyce Rud Other Clinician: Referring Provider: BABAOFF, MARCUS Treating Provider/Extender: Linwood Dibbles, HOYT Weeks in Treatment: 5 History of Present Illness HPI Description: 07/01/17 on evaluation today patient appears to be doing overall okay although she is on hospice and is very malnourished appearing. She eats some and her caregivers do try to mix in peanut butter with some of her food items and work on other ways of supplementing her nutrition with proteins such as ensure but she really does not eat a sufficient amount in order to heal this wound that she is currently dealing with. She has been diagnosed with adult failure to thrive. She also has Alzheimer's disease. She therefore is very difficult to reason with. Currently she is on hospice and under their care upon reviewing the note it does appear that it was stated that the providers who saw her on 06/21/17 was going to place  her on antibiotics but I'm unsure of what antibiotic she ended up being on. This was also when hospice was recommended to be involved in her care. Patient is wheelchair-bound and is prone to frequent falling. Secondary to mental status she is unable to rate or describe any discomfort she is having although with cleansing the wound she did seem to react in a way that would tell me she is having some pain at least. 07/15/2017 -- I'm seeing the patient for the first time today and she is a 83 year old female who is fairly well looked after by her family and caregivers 24/7. She has Alzheimer's dementia and has not been ambulating well but does have a fairly good offloading bed surface and wheelchair. Her diet is not the best. She presents with a necrotic debris on the sacral region and understand hospice is also have looking after her 08/08/17 on evaluation today patient presents today for evaluation of her sacral wound which actually appears to be doing better. Unfortunately she has a left Ischial wound, left heel wound, and right lateral foot wound all which are pressure related and noted since her last visit with [place]. This seems to be potentially due to the fact that she is moving less and less in her bed and therefore is more likely to develop pressure hotspots. Nonetheless right now she is on the egg crate Foam is far as her mattress is concerned she does have a hospital bed. She does not appear to have any significant discomfort other than the left foot laterally. Electronic Signature(s) Signed: 08/08/2017 5:27:57 PM By: Lenda Kelp PA-C Entered By: Lenda Kelp on 08/08/2017 15:05:01 Theresa Frank ([id number]) -------------------------------------------------------------------------------- Physical Exam Details Patient Name: Theresa Frank Date of Service: 08/08/2017 12:30 PM Medical Record Number: [id number] Patient Account Number: [id number] Date of Birth/Sex: March 26, 1925 (83 y.o.  Female) Treating RN: Curtis Sites Primary Care Provider: BABAOFF, MARCUS Other Clinician: Referring Provider: BABAOFF, MARCUS Treating Provider/Extender: STONE III, HOYT Weeks in Treatment: 5 Constitutional Chronically ill appearing but in no apparent acute distress. Respiratory normal breathing without difficulty. clear to auscultation bilaterally. Cardiovascular regular rate and rhythm with normal S1, S2. Psychiatric Patient is not able to cooperate in decision making regarding care. Patient has dementia. patient is confused. Notes Patient sacral wound does appear to be improving unfortunately the wound over the right heel, left lateral foot, and left Ischial region do not appear to be doing as well. They are slough covered that no debridement was performed today due to patient's hospice status. Electronic Signature(s) Signed: 08/08/2017 5:27:57 PM By: Lenda Kelp PA-C Entered By: Lenda Kelp on 08/08/2017 15:06:48 Theresa Frank ([id number]) -------------------------------------------------------------------------------- Physician Orders Details Patient Name: Theresa Frank Date of Service: 08/08/2017 12:30 PM Medical Record Number: [id number] Patient Account Number: [id number] Date of Birth/Sex: Apr 03, 1925 (83 y.o. Female) Treating RN: Curtis Sites Primary Care Provider: BABAOFF, MARCUS Other Clinician: Referring Provider: BABAOFF, MARCUS Treating Provider/Extender: STONE III, HOYT Weeks in Treatment: 5 Verbal / Phone Orders: No Diagnosis Coding ICD-10 Coding Code Description L89.323 Pressure ulcer of left buttock, stage 3 E44.0 Moderate protein-calorie malnutrition R62.7 Adult failure to thrive I10 Essential (primary) hypertension L89.154 Pressure ulcer of sacral region, stage 4 Wound Cleansing Wound #1 Sacrum o Clean wound with Normal Saline. o Cleanse wound with mild soap and water o May Shower, gently pat wound dry prior to applying new  dressing. Wound #2 Left Ischium o Clean wound with Normal Saline. o Cleanse wound with mild soap and water o May Shower, gently pat wound dry prior to applying new dressing. Wound #3 Left Calcaneus o Clean wound with Normal Saline. o Cleanse wound with mild soap and water o May Shower, gently pat wound dry prior to applying new dressing. Wound #4 Right,Lateral Foot o Clean wound with Normal Saline. o Cleanse wound with mild soap and water o May Shower, gently pat wound dry prior to applying new dressing. Anesthetic Wound #1 Sacrum o Topical Lidocaine 4% cream applied to wound bed prior to debridement Wound #2 Left Ischium o Topical Lidocaine 4% cream applied to wound bed prior to debridement Wound #3 Left Calcaneus o Topical Lidocaine 4% cream applied to wound bed prior to debridement Wound #4 Right,Lateral Foot o Topical Lidocaine 4% cream applied to wound bed prior to debridement Skin Barriers/Peri-Wound Care Theresa Frank, Theresa Frank ([id number]) Wound #1 Sacrum o Skin Prep Wound #2 Left Ischium o Skin Prep Wound #3 Left Calcaneus o Skin Prep Wound #4 Right,Lateral Foot o Skin Prep Primary Wound Dressing Wound #2 Left Ischium o Santyl Ointment Wound #3 Left Calcaneus o Santyl Ointment Wound #4 Right,Lateral Foot o Santyl Ointment Wound #1 Sacrum o [place] Non-Adherent - or equivalent Secondary Dressing Wound #1 Sacrum o Dry Gauze o Boardered Foam Dressing Wound #2 Left Ischium o Dry Gauze o Boardered Foam Dressing Wound #3 Left Calcaneus o Dry Gauze o Boardered Foam Dressing Wound #4 Right,Lateral Foot o Dry Gauze o Boardered Foam Dressing Dressing Change Frequency Wound #1 Sacrum o Change dressing every day. o Other: - as needed Wound #2 Left Ischium o Change dressing every day. o Other: - as needed Wound #3 Left Calcaneus o Change dressing every day. o Other: - as needed Wound #4  Right,Lateral Foot Theresa Frank, Theresa H. ([id number]) o Change dressing every day. o Other: - as needed Follow-up Appointments Wound #1 Sacrum   o Return Appointment in 1 week. Wound #2 Left Ischium o Return Appointment in 1 week. Wound #3 Left Calcaneus o Return Appointment in 1 week. Wound #4 Right,Lateral Foot o Return Appointment in 1 week. Off-Loading Wound #1 Sacrum o Turn and reposition every 2 hours o Other: - HOSPICE PLEASE PROVIDE AIR MATTRESS FOR PATIENT USE Wound #2 Left Ischium o Turn and reposition every 2 hours o Other: - HOSPICE PLEASE PROVIDE AIR MATTRESS FOR PATIENT USE Wound #3 Left Calcaneus o Turn and reposition every 2 hours o Other: - HOSPICE PLEASE PROVIDE AIR MATTRESS FOR PATIENT USE Wound #4 Right,Lateral Foot o Turn and reposition every 2 hours o Other: - HOSPICE PLEASE PROVIDE AIR MATTRESS FOR PATIENT USE Additional Orders / Instructions Wound #1 Sacrum o Increase protein intake. Wound #2 Left Ischium o Increase protein intake. Wound #3 Left Calcaneus o Increase protein intake. Wound #4 Right,Lateral Foot o Increase protein intake. Home Health Wound #1 Sacrum o Continue Home Health Visits o Home Health Nurse may visit PRN to address patientos wound care needs. o FACE TO FACE ENCOUNTER: MEDICARE and MEDICAID PATIENTS: I certify that this patient is under my care and that I had a face-to-face encounter that meets the physician face-to-face encounter requirements with this patient on this date. The encounter with the patient was in whole or in part for the following MEDICAL CONDITION: (primary reason for Home Healthcare) MEDICAL NECESSITY: I certify, that based on my findings, NURSING services are a medically necessary home health service. HOME BOUND STATUS: I certify that my clinical findings support that this patient is homebound (i.e., Due to illness or injury, pt requires aid of Theresa Frank, Theresa Frank.  ([id number]) supportive devices such as crutches, cane, wheelchairs, walkers, the use of special transportation or the assistance of another person to leave their place of residence. There is a normal inability to leave the home and doing so requires considerable and taxing effort. Other absences are for medical reasons / religious services and are infrequent or of short duration when for other reasons). o If current dressing causes regression in wound condition, may D/C ordered dressing product/s and apply Normal Saline Moist Dressing daily until next Wound Healing Center / Other MD appointment. Notify Wound Healing Center of regression in wound condition at [phone]. o Please direct any NON-WOUND related issues/requests for orders to patient's Primary Care Physician Wound #2 Left Ischium o Continue Home Health Visits o Home Health Nurse may visit PRN to address patientos wound care needs. o FACE TO FACE ENCOUNTER: MEDICARE and MEDICAID PATIENTS: I certify that this patient is under my care and that I had a face-to-face encounter that meets the physician face-to-face encounter requirements with this patient on this date. The encounter with the patient was in whole or in part for the following MEDICAL CONDITION: (primary reason for Home Healthcare) MEDICAL NECESSITY: I certify, that based on my findings, NURSING services are a medically necessary home health service. HOME BOUND STATUS: I certify that my clinical findings support that this patient is homebound (i.e., Due to illness or injury, pt requires aid of supportive devices such as crutches, cane, wheelchairs, walkers, the use of special transportation or the assistance of another person to leave their place of residence. There is a normal inability to leave the home and doing so requires considerable and taxing effort. Other absences are for medical reasons / religious services and are infrequent or of short duration when for  other reasons). o If current dressing causes regression in  wound condition, may D/C ordered dressing product/s and apply Normal Saline Moist Dressing daily until next Wound Healing Center / Other MD appointment. Notify Wound Healing Center of regression in wound condition at [phone]. o Please direct any NON-WOUND related issues/requests for orders to patient's Primary Care Physician Wound #3 Left Calcaneus o Continue Home Health Visits o Home Health Nurse may visit PRN to address patientos wound care needs. o FACE TO FACE ENCOUNTER: MEDICARE and MEDICAID PATIENTS: I certify that this patient is under my care and that I had a face-to-face encounter that meets the physician face-to-face encounter requirements with this patient on this date. The encounter with the patient was in whole or in part for the following MEDICAL CONDITION: (primary reason for Home Healthcare) MEDICAL NECESSITY: I certify, that based on my findings, NURSING services are a medically necessary home health service. HOME BOUND STATUS: I certify that my clinical findings support that this patient is homebound (i.e., Due to illness or injury, pt requires aid of supportive devices such as crutches, cane, wheelchairs, walkers, the use of special transportation or the assistance of another person to leave their place of residence. There is a normal inability to leave the home and doing so requires considerable and taxing effort. Other absences are for medical reasons / religious services and are infrequent or of short duration when for other reasons). o If current dressing causes regression in wound condition, may D/C ordered dressing product/s and apply Normal Saline Moist Dressing daily until next Wound Healing Center / Other MD appointment. Notify Wound Healing Center of regression in wound condition at [phone]. o Please direct any NON-WOUND related issues/requests for orders to patient's Primary Care  Physician Wound #4 Right,Lateral Foot o Continue Home Health Visits o Home Health Nurse may visit PRN to address patientos wound care needs. o FACE TO FACE ENCOUNTER: MEDICARE and MEDICAID PATIENTS: I certify that this patient is under my care and that I had a face-to-face encounter that meets the physician face-to-face encounter requirements with this patient on this date. The encounter with the patient was in whole or in part for the following MEDICAL CONDITION: (primary reason for Home Healthcare) MEDICAL NECESSITY: I certify, that based on my findings, NURSING services are a medically necessary home health service. HOME BOUND STATUS: I certify that my clinical findings support that this patient is homebound (i.e., Due to illness or injury, pt requires aid of supportive devices such as crutches, cane, wheelchairs, walkers, the use of special transportation or the assistance of another person to leave their place of residence. There is a normal inability to leave the home and doing so requires considerable and taxing effort. Other absences are for medical reasons / religious services and are infrequent or of short duration when for other reasons). Theresa Frank, Theresa Frank. ([id number]) o If current dressing causes regression in wound condition, may D/C ordered dressing product/s and apply Normal Saline Moist Dressing daily until next Wound Healing Center / Other MD appointment. Notify Wound Healing Center of regression in wound condition at [phone]. o Please direct any NON-WOUND related issues/requests for orders to patient's Primary Care Physician Medications-please add to medication list. Wound #2 Left Ischium o Santyl Enzymatic Ointment o Other: - Vitamin C, Zinc, MVI Wound #3 Left Calcaneus o Santyl Enzymatic Ointment o Other: - Vitamin C, Zinc, MVI Wound #4 Right,Lateral Foot o Santyl Enzymatic Ointment o Other: - Vitamin C, Zinc, MVI Electronic  Signature(s) Signed: 08/08/2017 4:00:26 PM By: Curtis Sites Signed: 08/08/2017 5:27:57 PM By:  Linwood Dibbles, Hoyt PA-C Entered By: Curtis Sites on 08/08/2017 13:16:22 Theresa Frank ([id number]) -------------------------------------------------------------------------------- Problem List Details Patient Name: Theresa Frank Date of Service: 08/08/2017 12:30 PM Medical Record Number: [id number] Patient Account Number: [id number] Date of Birth/Sex: [birth year] (83 y.o. Female) Treating RN: Curtis Sites Primary Care Provider: BABAOFF, MARCUS Other Clinician: Referring Provider: BABAOFF, MARCUS Treating Provider/Extender: Linwood Dibbles, HOYT Weeks in Treatment: 5 Active Problems ICD-10 Encounter Code Description Active Date Diagnosis L89.323 Pressure ulcer of left buttock, stage 3 07/01/2017 Yes E44.0 Moderate protein-calorie malnutrition 07/01/2017 Yes R62.7 Adult failure to thrive 07/01/2017 Yes L89.154 Pressure ulcer of sacral region, stage 4 07/15/2017 Yes L89.620 Pressure ulcer of left heel, unstageable 08/08/2017 Yes L89.890 Pressure ulcer of other site, unstageable 08/08/2017 Yes I10 Essential (primary) hypertension 07/01/2017 Yes Inactive Problems Resolved Problems Electronic Signature(s) Signed: 08/08/2017 5:27:57 PM By: Lenda Kelp PA-C Entered By: Lenda Kelp on 08/08/2017 15:01:36 Theresa Frank ([id number]) -------------------------------------------------------------------------------- Progress Note Details Patient Name: Theresa Frank Date of Service: 08/08/2017 12:30 PM Medical Record Number: [id number] Patient Account Number: [id number] Date of Birth/Sex: 1924-09-08 (83 y.o. Female) Treating RN: Curtis Sites Primary Care Provider: BABAOFF, MARCUS Other Clinician: Referring Provider: BABAOFF, MARCUS Treating Provider/Extender: Linwood Dibbles, HOYT Weeks in Treatment: 5 Subjective Chief Complaint Information obtained from Patient Left Gluteal Ulcer History of  Present Illness (HPI) 07/01/17 on evaluation today patient appears to be doing overall okay although she is on hospice and is very malnourished appearing. She eats some and her caregivers do try to mix in peanut butter with some of her food items and work on other ways of supplementing her nutrition with proteins such as ensure but she really does not eat a sufficient amount in order to heal this wound that she is currently dealing with. She has been diagnosed with adult failure to thrive. She also has Alzheimer's disease. She therefore is very difficult to reason with. Currently she is on hospice and under their care upon reviewing the note it does appear that it was stated that the providers who saw her on 06/21/17 was going to place her on antibiotics but I'm unsure of what antibiotic she ended up being on. This was also when hospice was recommended to be involved in her care. Patient is wheelchair-bound and is prone to frequent falling. Secondary to mental status she is unable to rate or describe any discomfort she is having although with cleansing the wound she did seem to react in a way that would tell me she is having some pain at least. 07/15/2017 -- I'm seeing the patient for the first time today and she is a 83 year old female who is fairly well looked after by her family and caregivers 24/7. She has Alzheimer's dementia and has not been ambulating well but does have a fairly good offloading bed surface and wheelchair. Her diet is not the best. She presents with a necrotic debris on the sacral region and understand hospice is also have looking after her 08/08/17 on evaluation today patient presents today for evaluation of her sacral wound which actually appears to be doing better. Unfortunately she has a left Ischial wound, left heel wound, and right lateral foot wound all which are pressure related and noted since her last visit with [place]. This seems to be potentially due to the fact that  she is moving less and less in her bed and therefore is more likely to develop pressure hotspots. Nonetheless right now she is on the egg crate Foam is  far as her mattress is concerned she does have a hospital bed. She does not appear to have any significant discomfort other than the left foot laterally. Patient History Unable to Obtain Patient History due to Dementia. Information obtained from Patient. Family History Cancer - Mother, Heart Disease - Child, No family history of Diabetes, Hereditary Spherocytosis, Hypertension, Kidney Disease, Lung Disease, Seizures, Stroke, Thyroid Problems, Tuberculosis. Social History Never smoker, Marital Status - Married, Alcohol Use - Never, Drug Use - No History, Caffeine Use - Never. Review of Systems (ROS) Constitutional Symptoms (General Health) Denies complaints or symptoms of Fever, Chills. Respiratory Theresa Frank, Theresa Frank ([id number]) The patient has no complaints or symptoms. Cardiovascular The patient has no complaints or symptoms. Psychiatric The patient has no complaints or symptoms. Objective Constitutional Chronically ill appearing but in no apparent acute distress. Vitals Time Taken: 12:46 PM, Height: 68 in, Weight: 108 lbs, BMI: 16.4, Temperature: 98.1 F, Pulse: 88 bpm, Respiratory Rate: 18 breaths/min, Blood Pressure: 127/81 mmHg. Respiratory normal breathing without difficulty. clear to auscultation bilaterally. Cardiovascular regular rate and rhythm with normal S1, S2. Psychiatric Patient is not able to cooperate in decision making regarding care. Patient has dementia. patient is confused. General Notes: Patient sacral wound does appear to be improving unfortunately the wound over the right heel, left lateral foot, and left Ischial region do not appear to be doing as well. They are slough covered that no debridement was performed today due to patient's hospice status. Integumentary (Hair, Skin) Wound #1 status is Open.  Original cause of wound was Pressure Injury. The wound is located on the Sacrum. The wound measures 6cm length x 2.2cm width x 2cm depth; 10.367cm^2 area and 20.735cm^3 volume. There is Fat Layer (Subcutaneous Tissue) Exposed exposed. There is no tunneling noted, however, there is undermining starting at 8:00 and ending at 5:00 with a maximum distance of 1.8cm. There is a large amount of serous drainage noted. The wound margin is distinct with the outline attached to the wound base. There is medium (34-66%) red granulation within the wound bed. There is a medium (34-66%) amount of necrotic tissue within the wound bed including Eschar and Adherent Slough. The periwound skin appearance exhibited: Maceration, Erythema. The surrounding wound skin color is noted with erythema which is circumferential. Periwound temperature was noted as No Abnormality. The periwound has tenderness on palpation. Wound #2 status is Open. Original cause of wound was Gradually Appeared. The wound is located on the Left Ischium. The wound measures 1.6cm length x 2.3cm width x 0.1cm depth; 2.89cm^2 area and 0.289cm^3 volume. There is Fat Layer (Subcutaneous Tissue) Exposed exposed. There is no tunneling or undermining noted. There is a large amount of serous drainage noted. The wound margin is flat and intact. There is medium (34-66%) pink granulation within the wound bed. There is a medium (34-66%) amount of necrotic tissue within the wound bed including Adherent Slough. The periwound skin appearance did not exhibit: Callus, Crepitus, Excoriation, Induration, Rash, Scarring, Dry/Scaly, Maceration, Atrophie Blanche, Cyanosis, Ecchymosis, Hemosiderin Staining, Mottled, Pallor, Rubor, Erythema. Periwound temperature was noted as No Abnormality. The periwound has tenderness on palpation. Wound #3 status is Open. Original cause of wound was Gradually Appeared. The wound is located on the Left Calcaneus. The wound measures 1.9cm  length x 4.2cm width x 0.2cm depth; 6.267cm^2 area and 1.253cm^3 volume. There is Fat Layer (Subcutaneous Tissue) Exposed exposed. There is no tunneling or undermining noted. There is a large amount of serous Tague, Brenlynn H. ([id number]) drainage noted. The  wound margin is flat and intact. There is no granulation within the wound bed. There is a large (67-100%) amount of necrotic tissue within the wound bed including Eschar and Adherent Slough. The periwound skin appearance did not exhibit: Callus, Crepitus, Excoriation, Induration, Rash, Scarring, Dry/Scaly, Maceration, Atrophie Blanche, Cyanosis, Ecchymosis, Hemosiderin Staining, Mottled, Pallor, Rubor, Erythema. Periwound temperature was noted as No Abnormality. The periwound has tenderness on palpation. Wound #4 status is Open. Original cause of wound was Gradually Appeared. The wound is located on the Right,Lateral Foot. The wound measures 0.5cm length x 0.5cm width x 0.2cm depth; 0.196cm^2 area and 0.039cm^3 volume. There is Fat Layer (Subcutaneous Tissue) Exposed exposed. There is no tunneling or undermining noted. There is a large amount of serous drainage noted. The wound margin is flat and intact. There is small (1-33%) pink granulation within the wound bed. There is a large (67-100%) amount of necrotic tissue within the wound bed including Eschar and Adherent Slough. The periwound skin appearance did not exhibit: Callus, Crepitus, Excoriation, Induration, Rash, Scarring, Dry/Scaly, Maceration, Atrophie Blanche, Cyanosis, Ecchymosis, Hemosiderin Staining, Mottled, Pallor, Rubor, Erythema. Periwound temperature was noted as No Abnormality. The periwound has tenderness on palpation. 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 Cleanse wound with mild soap and water May Shower, gently pat wound dry prior to applying new dressing. Wound #2 Left Ischium: Clean wound with Normal Saline. Cleanse wound with mild soap and water May Shower, gently pat wound dry prior to applying new dressing. Wound #3 Left Calcaneus: Clean wound with Normal Saline. Cleanse wound with mild soap and water May Shower, gently pat wound dry prior to applying new dressing. Wound #4 Right,Lateral Foot: Clean wound with Normal Saline. Cleanse wound with mild soap and water May Shower, gently pat wound dry prior to applying new dressing. Anesthetic: Wound #1 Sacrum: Topical Lidocaine 4% cream applied to wound bed prior to debridement Theresa Frank, Theresa Frank ([id number]) Wound #2 Left Ischium: Topical Lidocaine 4% cream applied to wound bed prior to debridement Wound #3 Left Calcaneus: Topical Lidocaine 4% cream applied to wound bed prior to debridement Wound #4 Right,Lateral Foot: Topical Lidocaine 4% cream applied to wound bed prior to debridement Skin Barriers/Peri-Wound Care: Wound #1 Sacrum: Skin Prep Wound #2 Left Ischium: Skin Prep Wound #3 Left Calcaneus: Skin Prep Wound #4 Right,Lateral Foot: Skin Prep Primary Wound Dressing: Wound #2 Left Ischium: Santyl Ointment Wound #3 Left Calcaneus: Santyl Ointment Wound #4 Right,Lateral Foot: Santyl Ointment Wound #1 Sacrum: Silvercel Non-Adherent - or equivalent Secondary Dressing: Wound #1 Sacrum: Dry Gauze Boardered Foam Dressing Wound #2 Left Ischium: Dry Gauze Boardered Foam Dressing Wound #3 Left Calcaneus: Dry Gauze Boardered Foam Dressing Wound #4 Right,Lateral Foot: Dry Gauze Boardered Foam Dressing Dressing Change Frequency: Wound #1 Sacrum: Change dressing every day. Other: - as  needed Wound #2 Left Ischium: Change dressing every day. Other: - as needed Wound #3 Left Calcaneus: Change dressing every day. Other: - as needed Wound #4 Right,Lateral Foot: Change dressing every day. Other: - as needed Follow-up Appointments: Wound #1 Sacrum: Return Appointment in 1 week. Wound #2 Left Ischium: Return Appointment in 1 week. Wound #3 Left Calcaneus: Return Appointment  in 1 week. Wound #4 Right,Lateral Foot: Return Appointment in 1 week. Off-Loading: Theresa ShihZACHARY, Theresa H. ([id number]) Wound #1 Sacrum: Turn and reposition every 2 hours Other: - HOSPICE PLEASE PROVIDE AIR MATTRESS FOR PATIENT USE Wound #2 Left Ischium: Turn and reposition every 2 hours Other: - HOSPICE PLEASE PROVIDE AIR MATTRESS FOR PATIENT USE Wound #3 Left Calcaneus: Turn and reposition every 2 hours Other: - HOSPICE PLEASE PROVIDE AIR MATTRESS FOR PATIENT USE Wound #4 Right,Lateral Foot: Turn and reposition every 2 hours Other: - HOSPICE PLEASE PROVIDE AIR MATTRESS FOR PATIENT USE Additional Orders / Instructions: Wound #1 Sacrum: Increase protein intake. Wound #2 Left Ischium: Increase protein intake. Wound #3 Left Calcaneus: Increase protein intake. Wound #4 Right,Lateral Foot: Increase protein intake. Home Health: Wound #1 Sacrum: Continue Home Health Visits Home Health Nurse may visit PRN to address patient s wound care needs. FACE TO FACE ENCOUNTER: MEDICARE and MEDICAID PATIENTS: I certify that this patient is under my care and that I had a face-to-face encounter that meets the physician face-to-face encounter requirements with this patient on this date. The encounter with the patient was in whole or in part for the following MEDICAL CONDITION: (primary reason for Home Healthcare) MEDICAL NECESSITY: I certify, that based on my findings, NURSING services are a medically necessary home health service. HOME BOUND STATUS: I certify that my clinical findings support that this patient  is homebound (i.e., Due to illness or injury, pt requires aid of supportive devices such as crutches, cane, wheelchairs, walkers, the use of special transportation or the assistance of another person to leave their place of residence. There is a normal inability to leave the home and doing so requires considerable and taxing effort. Other absences are for medical reasons / religious services and are infrequent or of short duration when for other reasons). If current dressing causes regression in wound condition, may D/C ordered dressing product/s and apply Normal Saline Moist Dressing daily until next Wound Healing Center / Other MD appointment. Notify Wound Healing Center of regression in wound condition at [phone]. Please direct any NON-WOUND related issues/requests for orders to patient's Primary Care Physician Wound #2 Left Ischium: Continue Home Health Visits Home Health Nurse may visit PRN to address patient s wound care needs. FACE TO FACE ENCOUNTER: MEDICARE and MEDICAID PATIENTS: I certify that this patient is under my care and that I had a face-to-face encounter that meets the physician face-to-face encounter requirements with this patient on this date. The encounter with the patient was in whole or in part for the following MEDICAL CONDITION: (primary reason for Home Healthcare) MEDICAL NECESSITY: I certify, that based on my findings, NURSING services are a medically necessary home health service. HOME BOUND STATUS: I certify that my clinical findings support that this patient is homebound (i.e., Due to illness or injury, pt requires aid of supportive devices such as crutches, cane, wheelchairs, walkers, the use of special transportation or the assistance of another person to leave their place of residence. There is a normal inability to leave the home and doing so requires considerable and taxing effort. Other absences are for medical reasons / religious services and are  infrequent or of short duration when for other reasons). If current dressing causes regression in wound condition, may D/C ordered dressing product/s and apply Normal Saline Moist Dressing daily until next Wound Healing Center / Other MD appointment. Notify Wound Healing Center of regression in wound condition at [phone]. Please direct any NON-WOUND related issues/requests for orders to  patient's Primary Care Physician Wound #3 Left Calcaneus: Continue Home Health Visits Home Health Nurse may visit PRN to address patient s wound care needs. FACE TO FACE ENCOUNTER: MEDICARE and MEDICAID PATIENTS: I certify that this patient is under my care and that I had a face-to-face encounter that meets the physician face-to-face encounter requirements with this patient on this date. The encounter with the patient was in whole or in part for the following MEDICAL CONDITION: (primary reason for Home Theresa Frank, Theresa Frank ([id number]) Healthcare) MEDICAL NECESSITY: I certify, that based on my findings, NURSING services are a medically necessary home health service. HOME BOUND STATUS: I certify that my clinical findings support that this patient is homebound (i.e., Due to illness or injury, pt requires aid of supportive devices such as crutches, cane, wheelchairs, walkers, the use of special transportation or the assistance of another person to leave their place of residence. There is a normal inability to leave the home and doing so requires considerable and taxing effort. Other absences are for medical reasons / religious services and are infrequent or of short duration when for other reasons). If current dressing causes regression in wound condition, may D/C ordered dressing product/s and apply Normal Saline Moist Dressing daily until next Wound Healing Center / Other MD appointment. Notify Wound Healing Center of regression in wound condition at [phone]. Please direct any NON-WOUND related  issues/requests for orders to patient's Primary Care Physician Wound #4 Right,Lateral Foot: Continue Home Health Visits Home Health Nurse may visit PRN to address patient s wound care needs. FACE TO FACE ENCOUNTER: MEDICARE and MEDICAID PATIENTS: I certify that this patient is under my care and that I had a face-to-face encounter that meets the physician face-to-face encounter requirements with this patient on this date. The encounter with the patient was in whole or in part for the following MEDICAL CONDITION: (primary reason for Home Healthcare) MEDICAL NECESSITY: I certify, that based on my findings, NURSING services are a medically necessary home health service. HOME BOUND STATUS: I certify that my clinical findings support that this patient is homebound (i.e., Due to illness or injury, pt requires aid of supportive devices such as crutches, cane, wheelchairs, walkers, the use of special transportation or the assistance of another person to leave their place of residence. There is a normal inability to leave the home and doing so requires considerable and taxing effort. Other absences are for medical reasons / religious services and are infrequent or of short duration when for other reasons). If current dressing causes regression in wound condition, may D/C ordered dressing product/s and apply Normal Saline Moist Dressing daily until next Wound Healing Center / Other MD appointment. Notify Wound Healing Center of regression in wound condition at [phone]. Please direct any NON-WOUND related issues/requests for orders to patient's Primary Care Physician Medications-please add to medication list.: Wound #2 Left Ischium: Santyl Enzymatic Ointment Other: - Vitamin C, Zinc, MVI Wound #3 Left Calcaneus: Santyl Enzymatic Ointment Other: - Vitamin C, Zinc, MVI Wound #4 Right,Lateral Foot: Santyl Enzymatic Ointment Other: - Vitamin C, Zinc, MVI I'm going to initiate Santyl for treatment of  all of patient's wounds except to the sacral wound which we're gonna switch to packing the silver alginate. We will see were things stand. I did recommend potential an air mattress or at least a improved surface for her to be lying on in order to prevent worsening of her wounds and potentially new wounds. Please see above for specific wound care orders. We  will see patient for re-evaluation in 1 week here in the clinic. If anything worsens or changes patient will contact our office for additional recommendations. Electronic Signature(s) Signed: 08/08/2017 5:27:57 PM By: Lenda Kelp PA-C Entered By: Lenda Kelp on 08/08/2017 15:08:12 Theresa Frank ([id number]) -------------------------------------------------------------------------------- ROS/PFSH Details Patient Name: Theresa Frank Date of Service: 08/08/2017 12:30 PM Medical Record Number: [id number] Patient Account Number: [id number] Date of Birth/Sex: Feb 04, 1925 (83 y.o. Female) Treating RN: Curtis Sites Primary Care Provider: BABAOFF, MARCUS Other Clinician: Referring Provider: BABAOFF, MARCUS Treating Provider/Extender: STONE III, HOYT Weeks in Treatment: 5 Unable to Obtain Patient History due to oo Dementia Information Obtained From Patient Wound History Do you currently have one or more open woundso Yes How many open wounds do you currently haveo 1 Approximately how long have you had your woundso 2 months How have you been treating your wound(s) until nowo bandage Has your wound(s) ever healed and then re-openedo No Have you had any lab work done in the past montho No Have you tested positive for an antibiotic resistant organism (MRSA, VRE)o No Have you tested positive for osteomyelitis (bone infection)o No Constitutional Symptoms (General Health) Complaints and Symptoms: Negative for: Fever; Chills Respiratory Complaints and Symptoms: No Complaints or Symptoms Cardiovascular Complaints and Symptoms: No  Complaints or Symptoms Psychiatric Complaints and Symptoms: No Complaints or Symptoms Immunizations Pneumococcal Vaccine: Received Pneumococcal Vaccination: No Implantable Devices Family and Social History Cancer: Yes - Mother; Diabetes: No; Heart Disease: Yes - Child; Hereditary Spherocytosis: No; Hypertension: No; Kidney Disease: No; Lung Disease: No; Seizures: No; Stroke: No; Thyroid Problems: No; Tuberculosis: No; Never smoker; Marital Status - Married; Alcohol Use: Never; Drug Use: No History; Caffeine Use: Never; Financial Concerns: No; Food, Clothing or Shelter Needs: No; Support System Lacking: No; Transportation Concerns: No; Advanced Directives: Yes (Not Provided); Patient does not want information on Advanced Directives; Do not resuscitate: Yes (Not Provided); Living Will: No; Medical Power of Attorney: No Theresa Frank, Theresa Frank ([id number]) Physician Affirmation I have reviewed and agree with the above information. Electronic Signature(s) Signed: 08/08/2017 4:00:26 PM By: Curtis Sites Signed: 08/08/2017 5:27:57 PM By: Lenda Kelp PA-C Entered By: Lenda Kelp on 08/08/2017 15:05:47 Theresa Frank ([id number]) -------------------------------------------------------------------------------- SuperBill Details Patient Name: Theresa Frank Date of Service: 08/08/2017 Medical Record Number: [id number] Patient Account Number: [id number] Date of Birth/Sex: [birth year] (83 y.o. Female) Treating RN: Curtis Sites Primary Care Provider: BABAOFF, MARCUS Other Clinician: Referring Provider: BABAOFF, MARCUS Treating Provider/Extender: STONE III, HOYT Weeks in Treatment: 5 Diagnosis Coding ICD-10 Codes Code Description [phone] Pressure ulcer of left buttock, stage 3 E44.0 Moderate protein-calorie malnutrition R62.7 Adult failure to thrive L89.154 Pressure ulcer of sacral region, stage 4 L89.620 Pressure ulcer of left heel, unstageable L89.890 Pressure ulcer of other site,  unstageable I10 Essential (primary) hypertension Facility Procedures CPT4 Code: [id number] Description: 99214 - WOUND CARE VISIT-LEV 4 EST PT Modifier: Quantity: 1 Physician Procedures CPT4 Code: [id number] Description: 99214 - WC PHYS LEVEL 4 - EST PT ICD-10 Diagnosis Description L89.323 Pressure ulcer of left buttock, stage 3 E44.0 Moderate protein-calorie malnutrition R62.7 Adult failure to thrive L89.154 Pressure ulcer of sacral region, stage 4 Modifier: Quantity: 1 Electronic Signature(s) Signed: 08/08/2017 5:27:57 PM By: Lenda Kelp PA-C Entered By: Lenda Kelp on 08/08/2017 15:09:08

## 2017-08-15 ENCOUNTER — Ambulatory Visit: Payer: Medicare Other | Admitting: Surgery

## 2017-08-22 ENCOUNTER — Encounter: Admitting: Surgery

## 2017-08-22 DIAGNOSIS — L89323 Pressure ulcer of left buttock, stage 3: Secondary | ICD-10-CM | POA: Diagnosis not present

## 2017-08-22 NOTE — Progress Notes (Addendum)
Theresa, Frank ([id number]) Visit Report for 08/22/2017 Chief Complaint Document Details Patient Name: Theresa Frank, Theresa Frank. Date of Service: 08/22/2017 2:30 PM Medical Record Number: [id number] Patient Account Number: [id number] Date of Birth/Sex: 02/01/1925 (83 y.o. Female) Treating RN: Curtis Sites Primary Care Provider: Kandyce Rud Other Clinician: Referring Provider: BABAOFF, MARCUS Treating Provider/Extender: Rudene Re in Treatment: 7 Information Obtained from: Patient Chief Complaint Left Gluteal Ulcer Electronic Signature(s) Signed: 08/22/2017 3:56:26 PM By: Evlyn Kanner MD, FACS Entered By: Evlyn Kanner on 08/22/2017 15:56:26 Theresa Frank ([id number]) -------------------------------------------------------------------------------- HPI Details Patient Name: Theresa Frank Date of Service: 08/22/2017 2:30 PM Medical Record Number: [id number] Patient Account Number: [id number] Date of Birth/Sex: 07-29-1925 (83 y.o. Female) Treating RN: Curtis Sites Primary Care Provider: Kandyce Rud Other Clinician: Referring Provider: BABAOFF, MARCUS Treating Provider/Extender: Rudene Re in Treatment: 7 History of Present Illness HPI Description: 07/01/17 on evaluation today patient appears to be doing overall okay although she is on hospice and is very malnourished appearing. She eats some and her caregivers do try to mix in peanut butter with some of her food items and work on other ways of supplementing her nutrition with proteins such as ensure but she really does not eat a sufficient amount in order to heal this wound that she is currently dealing with. She has been diagnosed with adult failure to thrive. She also has Alzheimer's disease. She therefore is very difficult to reason with. Currently she is on hospice and under their care upon reviewing the note it does appear that it was stated that the providers who saw her on 06/21/17 was going to place  her on antibiotics but I'm unsure of what antibiotic she ended up being on. This was also when hospice was recommended to be involved in her care. Patient is wheelchair-bound and is prone to frequent falling. Secondary to mental status she is unable to rate or describe any discomfort she is having although with cleansing the wound she did seem to react in a way that would tell me she is having some pain at least. 07/15/2017 -- I'm seeing the patient for the first time today and she is a 83 year old female who is fairly well looked after by her family and caregivers 24/7. She has Alzheimer's dementia and has not been ambulating well but does have a fairly good offloading bed surface and wheelchair. Her diet is not the best. She presents with a necrotic debris on the sacral region and understand hospice is also have looking after her 08/08/17 on evaluation today patient presents today for evaluation of her sacral wound which actually appears to be doing better. Unfortunately she has a left Ischial wound, left heel wound, and right lateral foot wound all which are pressure related and noted since her last visit with [place]. This seems to be potentially due to the fact that she is moving less and less in her bed and therefore is more likely to develop pressure hotspots. Nonetheless right now she is on the egg crate Foam is far as her mattress is concerned she does have a hospital bed. She does not appear to have any significant discomfort other than the left foot laterally. 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 She also has some superficial blebs on both shins and overall her condition is degraded significantly. She is under hospice care. Electronic Signature(s) Signed: 08/22/2017 3:57:17  PM By: Evlyn Kanner MD, FACS Entered By: Evlyn Kanner on 08/22/2017 15:57:16 Theresa Frank ([id number]) -------------------------------------------------------------------------------- Physical Exam Details Patient Name: Theresa Frank Date of Service: 08/22/2017 2:30 PM Medical Record Number: [id number] Patient Account Number: [id number] Date of Birth/Sex: [birth year] (83 y.o. Female) Treating RN: Curtis Sites Primary Care Provider: BABAOFF, MARCUS Other Clinician: Referring Provider: BABAOFF, MARCUS Treating Provider/Extender: Rudene Re in Treatment: 7 Constitutional . Pulse regular. Respirations normal and unlabored. Afebrile. . Eyes Nonicteric. Reactive to light. Ears, Nose, Mouth, and Throat Lips, teeth, and gums WNL.Marland Kitchen Moist mucosa without lesions. Neck supple and nontender. No palpable supraclavicular or cervical adenopathy. Normal sized without goiter. Respiratory WNL. No retractions.. Cardiovascular Pedal Pulses WNL. No clubbing, cyanosis or edema. Lymphatic No adneopathy. No adenopathy. No adenopathy. Musculoskeletal Adexa without tenderness or enlargement.. Digits and nails w/o clubbing, cyanosis, infection, petechiae, ischemia, or inflammatory conditions.. Integumentary (Hair, Skin) No suspicious lesions. No crepitus or fluctuance. No peri-wound warmth or erythema. No masses.Marland Kitchen Psychiatric Judgement and insight Intact.. No evidence of depression, anxiety, or agitation.. Notes several of the wounds have stage IV pressure injuries with necrotic slough and sharp debridement was not done but we will continue to use Santyl ointment locally. The wounds have progressively gotten worse. The 2 wounds on the right and left shin are fairly superficial and we used some silver alginate and a light dressing. Electronic Signature(s) Signed: 08/22/2017 3:58:25 PM By: Evlyn Kanner MD, FACS Entered By: Evlyn Kanner on 08/22/2017 15:58:25 Theresa Frank  ([id number]) -------------------------------------------------------------------------------- Physician Orders Details Patient Name: Theresa Frank Date of Service: 08/22/2017 2:30 PM Medical Record Number: [id number] Patient Account Number: [id number] Date of Birth/Sex: 09-Mar-1925 (83 y.o. Female) Treating RN: Curtis Sites Primary Care Provider: BABAOFF, MARCUS Other Clinician: Referring Provider: BABAOFF, MARCUS Treating Provider/Extender: Rudene Re in Treatment: 7 Verbal / Phone Orders: No Diagnosis Coding Wound Cleansing Wound #1 Sacrum o Clean wound with Normal Saline. o Cleanse wound with mild soap and water o May Shower, gently pat wound dry prior to applying new dressing. Wound #2 Left Ischium o Clean wound with Normal Saline. o Cleanse wound with mild soap and water o May Shower, gently pat wound dry prior to applying new dressing. Wound #3 Left Calcaneus o Clean wound with Normal Saline. o Cleanse wound with mild soap and water o May Shower, gently pat wound dry prior to applying new dressing. Wound #4 Right,Lateral Foot o Clean wound with Normal Saline. o Cleanse wound with mild soap and water o May Shower, gently pat wound dry prior to applying new dressing. Wound #5 Right,Medial Lower Leg o Clean wound with Normal Saline. o Cleanse wound with mild soap and water o May Shower, gently pat wound dry prior to applying new dressing. Wound #6 Left,Lateral Lower Leg o Clean wound with Normal Saline. o Cleanse wound with mild soap and water o May Shower, gently pat wound dry prior to applying new dressing. Skin Barriers/Peri-Wound Care Wound #1 Sacrum o Skin Prep Wound #2 Left Ischium o Skin Prep Wound #3 Left Calcaneus o Skin Prep Wound #4 Right,Lateral Foot o Skin Prep Wound #5 Right,Medial Lower Leg ARNEDA, SAPPINGTON. ([id number]) o Skin Prep Wound #6 Left,Lateral Lower Leg o Skin Prep Primary Wound  Dressing Wound #1 Sacrum o Santyl Ointment Wound #2 Left Ischium o Santyl Ointment Wound #3 Left Calcaneus o Santyl Ointment Wound #4 Right,Lateral Foot o Santyl Ointment Wound #5 Right,Medial Lower Leg o Silvercel Non-Adherent - or equivalent Wound #6 Left,Lateral Lower Leg   o Silvercel Non-Adherent - or equivalent Secondary Dressing Wound #1 Sacrum o Dry Gauze o Boardered Foam Dressing Wound #2 Left Ischium o Dry Gauze o Boardered Foam Dressing o XtraSorb - xtrasorb and carboflex pease for drainage and odor control Wound #3 Left Calcaneus o Dry Gauze o Boardered Foam Dressing Wound #4 Right,Lateral Foot o Dry Gauze o Boardered Foam Dressing Wound #5 Right,Medial Lower Leg o Dry Gauze o Boardered Foam Dressing Wound #6 Left,Lateral Lower Leg o Dry Gauze o Boardered Foam Dressing Dressing Change Frequency Wound #1 Sacrum o Change dressing every day. o Other: - as needed Wound #2 Left Ischium LABRESHA, BOQUIST. ([id number]) o Change dressing every day. o Other: - as needed Wound #3 Left Calcaneus o Change dressing every day. o Other: - as needed Wound #4 Right,Lateral Foot o Change dressing every day. o Other: - as needed Wound #5 Right,Medial Lower Leg o Change dressing every day. o Other: - as needed Wound #6 Left,Lateral Lower Leg o Change dressing every day. o Other: - as needed Follow-up Appointments o Other: - only if recommended by Hospice Off-Loading Wound #1 Sacrum o Turn and reposition every 2 hours o Other: - HOSPICE PLEASE PROVIDE AIR MATTRESS FOR PATIENT USE Wound #2 Left Ischium o Turn and reposition every 2 hours o Other: - HOSPICE PLEASE PROVIDE AIR MATTRESS FOR PATIENT USE Wound #3 Left Calcaneus o Turn and reposition every 2 hours o Other: - HOSPICE PLEASE PROVIDE AIR MATTRESS FOR PATIENT USE Wound #4 Right,Lateral Foot o Turn and reposition every 2 hours o  Other: - HOSPICE PLEASE PROVIDE AIR MATTRESS FOR PATIENT USE Wound #5 Right,Medial Lower Leg o Turn and reposition every 2 hours o Other: - HOSPICE PLEASE PROVIDE AIR MATTRESS FOR PATIENT USE Wound #6 Left,Lateral Lower Leg o Turn and reposition every 2 hours o Other: - HOSPICE PLEASE PROVIDE AIR MATTRESS FOR PATIENT USE Additional Orders / Instructions Wound #1 Sacrum o Increase protein intake. Wound #2 Left Ischium o Increase protein intake. Wound #3 Left Calcaneus o Increase protein intake. REBECCA, JACQUART ([id number]) Wound #4 Right,Lateral Foot o Increase protein intake. Wound #5 Right,Medial Lower Leg o Increase protein intake. Wound #6 Left,Lateral Lower Leg o Increase protein intake. Home Health Wound #1 Sacrum o Continue Home Health Visits - Hospice o Home Health Nurse may visit PRN to address patientos wound care needs. o FACE TO FACE ENCOUNTER: MEDICARE and MEDICAID PATIENTS: I certify that this patient is under my care and that I had a face-to-face encounter that meets the physician face-to-face encounter requirements with this patient on this date. The encounter with the patient was in whole or in part for the following MEDICAL CONDITION: (primary reason for Home Healthcare) MEDICAL NECESSITY: I certify, that based on my findings, NURSING services are a medically necessary home health service. HOME BOUND STATUS: I certify that my clinical findings support that this patient is homebound (i.e., Due to illness or injury, pt requires aid of supportive devices such as crutches, cane, wheelchairs, walkers, the use of special transportation or the assistance of another person to leave their place of residence. There is a normal inability to leave the home and doing so requires considerable and taxing effort. Other absences are for medical reasons / religious services and are infrequent or of short duration when for other reasons). o If current  dressing causes regression in wound condition, may D/C ordered dressing product/s and apply Normal Saline Moist Dressing daily until next Wound Healing Center / Other MD  appointment. Notify Wound Healing Center of regression in wound condition at [phone]. o Please direct any NON-WOUND related issues/requests for orders to patient's Primary Care Physician Wound #2 Left Ischium o Continue Home Health Visits - Hospice o Home Health Nurse may visit PRN to address patientos wound care needs. o FACE TO FACE ENCOUNTER: MEDICARE and MEDICAID PATIENTS: I certify that this patient is under my care and that I had a face-to-face encounter that meets the physician face-to-face encounter requirements with this patient on this date. The encounter with the patient was in whole or in part for the following MEDICAL CONDITION: (primary reason for Home Healthcare) MEDICAL NECESSITY: I certify, that based on my findings, NURSING services are a medically necessary home health service. HOME BOUND STATUS: I certify that my clinical findings support that this patient is homebound (i.e., Due to illness or injury, pt requires aid of supportive devices such as crutches, cane, wheelchairs, walkers, the use of special transportation or the assistance of another person to leave their place of residence. There is a normal inability to leave the home and doing so requires considerable and taxing effort. Other absences are for medical reasons / religious services and are infrequent or of short duration when for other reasons). o If current dressing causes regression in wound condition, may D/C ordered dressing product/s and apply Normal Saline Moist Dressing daily until next Wound Healing Center / Other MD appointment. Notify Wound Healing Center of regression in wound condition at [phone]. o Please direct any NON-WOUND related issues/requests for orders to patient's Primary Care Physician Wound #3 Left  Calcaneus o Continue Home Health Visits - Hospice o Home Health Nurse may visit PRN to address patientos wound care needs. o FACE TO FACE ENCOUNTER: MEDICARE and MEDICAID PATIENTS: I certify that this patient is under my care and that I had a face-to-face encounter that meets the physician face-to-face encounter requirements with this patient on this date. The encounter with the patient was in whole or in part for the following MEDICAL CONDITION: (primary reason for Home Healthcare) MEDICAL NECESSITY: I certify, that based on my findings, NURSING services are a medically necessary home health service. HOME BOUND STATUS: I certify that my clinical findings support that this patient is homebound (i.e., Due to illness or injury, pt requires aid of supportive devices such as crutches, cane, wheelchairs, walkers, the use of special transportation or the assistance of another person to leave their place of residence. There is a normal inability to leave the home [place], KETRINA BOATENG. ([id number]) and doing so requires considerable and taxing effort. Other absences are for medical reasons / religious services and are infrequent or of short duration when for other reasons). o If current dressing causes regression in wound condition, may D/C ordered dressing product/s and apply Normal Saline Moist Dressing daily until next Wound Healing Center / Other MD appointment. Notify Wound Healing Center of regression in wound condition at [phone]. o Please direct any NON-WOUND related issues/requests for orders to patient's Primary Care Physician Wound #4 Right,Lateral Foot o Continue Home Health Visits - Hospice o Home Health Nurse may visit PRN to address patientos wound care needs. o FACE TO FACE ENCOUNTER: MEDICARE and MEDICAID PATIENTS: I certify that this patient is under my care and that I had a face-to-face encounter that meets the physician face-to-face encounter requirements with  this patient on this date. The encounter with the patient was in whole or in part for the following MEDICAL CONDITION: (primary reason for Home  Healthcare) MEDICAL NECESSITY: I certify, that based on my findings, NURSING services are a medically necessary home health service. HOME BOUND STATUS: I certify that my clinical findings support that this patient is homebound (i.e., Due to illness or injury, pt requires aid of supportive devices such as crutches, cane, wheelchairs, walkers, the use of special transportation or the assistance of another person to leave their place of residence. There is a normal inability to leave the home and doing so requires considerable and taxing effort. Other absences are for medical reasons / religious services and are infrequent or of short duration when for other reasons). o If current dressing causes regression in wound condition, may D/C ordered dressing product/s and apply Normal Saline Moist Dressing daily until next Wound Healing Center / Other MD appointment. Notify Wound Healing Center of regression in wound condition at [phone]. o Please direct any NON-WOUND related issues/requests for orders to patient's Primary Care Physician Wound #5 Right,Medial Lower Leg o Continue Home Health Visits - Hospice o Home Health Nurse may visit PRN to address patientos wound care needs. o FACE TO FACE ENCOUNTER: MEDICARE and MEDICAID PATIENTS: I certify that this patient is under my care and that I had a face-to-face encounter that meets the physician face-to-face encounter requirements with this patient on this date. The encounter with the patient was in whole or in part for the following MEDICAL CONDITION: (primary reason for Home Healthcare) MEDICAL NECESSITY: I certify, that based on my findings, NURSING services are a medically necessary home health service. HOME BOUND STATUS: I certify that my clinical findings support that this patient is homebound  (i.e., Due to illness or injury, pt requires aid of supportive devices such as crutches, cane, wheelchairs, walkers, the use of special transportation or the assistance of another person to leave their place of residence. There is a normal inability to leave the home and doing so requires considerable and taxing effort. Other absences are for medical reasons / religious services and are infrequent or of short duration when for other reasons). o If current dressing causes regression in wound condition, may D/C ordered dressing product/s and apply Normal Saline Moist Dressing daily until next Wound Healing Center / Other MD appointment. Notify Wound Healing Center of regression in wound condition at [phone]. o Please direct any NON-WOUND related issues/requests for orders to patient's Primary Care Physician Wound #6 Left,Lateral Lower Leg o Continue Home Health Visits - Hospice o Home Health Nurse may visit PRN to address patientos wound care needs. o FACE TO FACE ENCOUNTER: MEDICARE and MEDICAID PATIENTS: I certify that this patient is under my care and that I had a face-to-face encounter that meets the physician face-to-face encounter requirements with this patient on this date. The encounter with the patient was in whole or in part for the following MEDICAL CONDITION: (primary reason for Home Healthcare) MEDICAL NECESSITY: I certify, that based on my findings, NURSING services are a medically necessary home health service. HOME BOUND STATUS: I certify that my clinical findings support that this patient is homebound (i.e., Due to illness or injury, pt requires aid of supportive devices such as crutches, cane, wheelchairs, walkers, the use of special transportation or the assistance of another person to leave their place of residence. There is a normal inability to leave the home and doing so requires considerable and taxing effort. Other absences are for medical reasons /  religious services and are infrequent or of short duration when for other reasons). o If current dressing  causes regression in wound condition, may D/C ordered dressing product/s and apply Normal Saline Moist Dressing daily until next Wound Healing Center / Other MD appointment. Notify Wound Healing Center of regression in wound condition at [phone]. ESHANI, MAESTRE ([id number]) o Please direct any NON-WOUND related issues/requests for orders to patient's Primary Care Physician Medications-please add to medication list. Wound #1 Sacrum o Santyl Enzymatic Ointment o Other: - Vitamin C, Zinc, MVI Wound #2 Left Ischium o Santyl Enzymatic Ointment o Other: - Vitamin C, Zinc, MVI Wound #3 Left Calcaneus o Santyl Enzymatic Ointment o Other: - Vitamin C, Zinc, MVI Wound #4 Right,Lateral Foot o Santyl Enzymatic Ointment o Other: - Vitamin C, Zinc, MVI Patient Medications Allergies: Zocor, cefuroxime axetil, Ciproflaxin, Penicilin, Premarin, Septra Notifications Medication Indication Start End Santyl 08/22/2017 DOSE topical 250 unit/gram ointment - ointment topical as directed Electronic Signature(s) Signed: 08/22/2017 3:55:04 PM By: Evlyn Kanner MD, FACS Entered By: Evlyn Kanner on 08/22/2017 15:55:00 Theresa Frank ([id number]) -------------------------------------------------------------------------------- Problem List Details Patient Name: Theresa Frank Date of Service: 08/22/2017 2:30 PM Medical Record Number: [id number] Patient Account Number: [id number] Date of Birth/Sex: [birth year] (83 y.o. Female) Treating RN: Curtis Sites Primary Care Provider: Kandyce Rud Other Clinician: Referring Provider: BABAOFF, MARCUS Treating Provider/Extender: Rudene Re in Treatment: 7 Active Problems ICD-10 Encounter Code Description Active Date Diagnosis L89.323 Pressure ulcer of left buttock, stage 3 07/01/2017 Yes E44.0 Moderate protein-calorie  malnutrition 07/01/2017 Yes R62.7 Adult failure to thrive 07/01/2017 Yes L89.154 Pressure ulcer of sacral region, stage 4 07/15/2017 Yes L89.620 Pressure ulcer of left heel, unstageable 08/08/2017 Yes L89.890 Pressure ulcer of other site, unstageable 08/08/2017 Yes I10 Essential (primary) hypertension 07/01/2017 Yes Inactive Problems Resolved Problems Electronic Signature(s) Signed: 08/22/2017 3:56:14 PM By: Evlyn Kanner MD, FACS Entered By: Evlyn Kanner on 08/22/2017 15:56:14 Theresa Frank ([id number]) -------------------------------------------------------------------------------- Progress Note Details Patient Name: Theresa Frank Date of Service: 08/22/2017 2:30 PM Medical Record Number: [id number] Patient Account Number: [id number] Date of Birth/Sex: February 16, 1925 (83 y.o. Female) Treating RN: Curtis Sites Primary Care Provider: Kandyce Rud Other Clinician: Referring Provider: BABAOFF, MARCUS Treating Provider/Extender: Rudene Re in Treatment: 7 Subjective Chief Complaint Information obtained from Patient Left Gluteal Ulcer History of Present Illness (HPI) 07/01/17 on evaluation today patient appears to be doing overall okay although she is on hospice and is very malnourished appearing. She eats some and her caregivers do try to mix in peanut butter with some of her food items and work on other ways of supplementing her nutrition with proteins such as ensure but she really does not eat a sufficient amount in order to heal this wound that she is currently dealing with. She has been diagnosed with adult failure to thrive. She also has Alzheimer's disease. She therefore is very difficult to reason with. Currently she is on hospice and under their care upon reviewing the note it does appear that it was stated that the providers who saw her on 06/21/17 was going to place her on antibiotics but I'm unsure of what antibiotic she ended up being on. This was also when  hospice was recommended to be involved in her care. Patient is wheelchair-bound and is prone to frequent falling. Secondary to mental status she is unable to rate or describe any discomfort she is having although with cleansing the wound she did seem to react in a way that would tell me she is having some pain at least. 07/15/2017 -- I'm seeing the patient for the first time today and  she is a 83 year old female who is fairly well looked after by her family and caregivers 24/7. She has Alzheimer's dementia and has not been ambulating well but does have a fairly good offloading bed surface and wheelchair. Her diet is not the best. She presents with a necrotic debris on the sacral region and understand hospice is also have looking after her 08/08/17 on evaluation today patient presents today for evaluation of her sacral wound which actually appears to be doing better. Unfortunately she has a left Ischial wound, left heel wound, and right lateral foot wound all which are pressure related and noted since her last visit with [place]. This seems to be potentially due to the fact that she is moving less and less in her bed and therefore is more likely to develop pressure hotspots. Nonetheless right now she is on the egg crate Foam is far as her mattress is concerned she does have a hospital bed. She does not appear to have any significant discomfort other than the left foot laterally. 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 She also has some superficial blebs on both shins and overall her condition is degraded significantly. She is under hospice care. Patient History Unable to Obtain Patient History due to Dementia. Information obtained from Patient. Family History Cancer - Mother, Heart Disease - Child, No family history of Diabetes, Hereditary Spherocytosis, Hypertension, Kidney  Disease, Lung Disease, Seizures, Stroke, Thyroid Problems, Tuberculosis. Social History Never smoker, Marital Status - Married, Alcohol Use - Never, Drug Use - No History, Caffeine Use - Never. QUANDRA, FEDORCHAK ([id number]) Objective Constitutional Pulse regular. Respirations normal and unlabored. Afebrile. Vitals Time Taken: 2:53 PM, Height: 68 in, Weight: 108 lbs, BMI: 16.4, Temperature: 97.6 F, Pulse: 54 bpm, Respiratory Rate: 18 breaths/min, Blood Pressure: 106/63 mmHg. Eyes Nonicteric. Reactive to light. Ears, Nose, Mouth, and Throat Lips, teeth, and gums WNL.Marland Kitchen Moist mucosa without lesions. Neck supple and nontender. No palpable supraclavicular or cervical adenopathy. Normal sized without goiter. Respiratory WNL. No retractions.. Cardiovascular Pedal Pulses WNL. No clubbing, cyanosis or edema. Lymphatic No adneopathy. No adenopathy. No adenopathy. Musculoskeletal Adexa without tenderness or enlargement.. Digits and nails w/o clubbing, cyanosis, infection, petechiae, ischemia, or inflammatory conditions.Marland Kitchen Psychiatric Judgement and insight Intact.. No evidence of depression, anxiety, or agitation.. General Notes: several of the wounds have stage IV pressure injuries with necrotic slough and sharp debridement was not done but we will continue to use Santyl ointment locally. The wounds have progressively gotten worse. The 2 wounds on the right and left shin are fairly superficial and we used some silver alginate and a light dressing. Integumentary (Hair, Skin) No suspicious lesions. No crepitus or fluctuance. No peri-wound warmth or erythema. No masses.. Wound #1 status is Open. Original cause of wound was Pressure Injury. The wound is located on the Sacrum. The wound measures 5.5cm length x 1.7cm width x 0.8cm depth; 7.343cm^2 area and 5.875cm^3 volume. There is Fat Layer (Subcutaneous Tissue) Exposed exposed. There is no tunneling noted, however, there is undermining starting at  12:00 and ending at 5:00 with a maximum distance of 2.9cm. There is a large amount of serous drainage noted. The wound margin is distinct with the outline attached to the wound base. There is small (1-33%) red granulation within the wound bed. There is a large (67-100%) amount of necrotic tissue within the wound bed including Eschar and  Adherent Slough. The periwound skin appearance exhibited: Maceration, Erythema. The surrounding wound skin color is noted with erythema which is Theresa ShihZACHARY, Mariaeduarda H. ([id number]) circumferential. Periwound temperature was noted as No Abnormality. The periwound has tenderness on palpation. Wound #2 status is Open. Original cause of wound was Gradually Appeared. The wound is located on the Left Ischium. The wound measures 3.4cm length x 4.8cm width x 0.7cm depth; 12.818cm^2 area and 8.972cm^3 volume. There is Fat Layer (Subcutaneous Tissue) Exposed exposed. There is no tunneling or undermining noted. There is a large amount of serous drainage noted. The wound margin is flat and intact. There is small (1-33%) pink granulation within the wound bed. There is a large (67-100%) amount of necrotic tissue within the wound bed including Eschar and Adherent Slough. The periwound skin appearance did not exhibit: Callus, Crepitus, Excoriation, Induration, Rash, Scarring, Dry/Scaly, Maceration, Atrophie Blanche, Cyanosis, Ecchymosis, Hemosiderin Staining, Mottled, Pallor, Rubor, Erythema. Periwound temperature was noted as No Abnormality. The periwound has tenderness on palpation. Wound #3 status is Open. Original cause of wound was Gradually Appeared. The wound is located on the Left Calcaneus. The wound measures 2.3cm length x 4.2cm width x 0.2cm depth; 7.587cm^2 area and 1.517cm^3 volume. There is Fat Layer (Subcutaneous Tissue) Exposed exposed. There is no tunneling or undermining noted. There is a large amount of serous drainage noted. The wound margin is flat and intact. There  is no granulation within the wound bed. There is a large (67-100%) amount of necrotic tissue within the wound bed including Eschar and Adherent Slough. The periwound skin appearance did not exhibit: Callus, Crepitus, Excoriation, Induration, Rash, Scarring, Dry/Scaly, Maceration, Atrophie Blanche, Cyanosis, Ecchymosis, Hemosiderin Staining, Mottled, Pallor, Rubor, Erythema. Periwound temperature was noted as No Abnormality. The periwound has tenderness on palpation. Wound #4 status is Open. Original cause of wound was Gradually Appeared. The wound is located on the Right,Lateral Foot. The wound measures 1cm length x 1.1cm width x 0.2cm depth; 0.864cm^2 area and 0.173cm^3 volume. There is Fat Layer (Subcutaneous Tissue) Exposed exposed. There is no tunneling or undermining noted. There is a large amount of serous drainage noted. The wound margin is flat and intact. There is small (1-33%) pink granulation within the wound bed. There is a large (67-100%) amount of necrotic tissue within the wound bed including Eschar and Adherent Slough. The periwound skin appearance did not exhibit: Callus, Crepitus, Excoriation, Induration, Rash, Scarring, Dry/Scaly, Maceration, Atrophie Blanche, Cyanosis, Ecchymosis, Hemosiderin Staining, Mottled, Pallor, Rubor, Erythema. Periwound temperature was noted as No Abnormality. The periwound has tenderness on palpation. Wound #5 status is Open. Original cause of wound was Blister. The wound is located on the Right,Medial Lower Leg. The wound measures 5.3cm length x 4.5cm width x 0.1cm depth; 18.732cm^2 area and 1.873cm^3 volume. There is no tunneling or undermining noted. There is a large amount of serous drainage noted. The wound margin is flat and intact. There is large (67-100%) pink granulation within the wound bed. There is a small (1-33%) amount of necrotic tissue within the wound bed including Adherent Slough. The periwound skin appearance did not exhibit: Callus,  Crepitus, Excoriation, Induration, Rash, Scarring, Dry/Scaly, Maceration, Atrophie Blanche, Cyanosis, Ecchymosis, Hemosiderin Staining, Mottled, Pallor, Rubor, Erythema. Periwound temperature was noted as No Abnormality. Wound #6 status is Open. Original cause of wound was Blister. The wound is located on the Left,Lateral Lower Leg. The wound measures 4.5cm length x 5.4cm width x 0.1cm depth; 19.085cm^2 area and 1.909cm^3 volume. There is no tunneling or undermining noted. There is a  large amount of serous drainage noted. The wound margin is flat and intact. There is large (67-100%) pink granulation within the wound bed. There is a small (1-33%) amount of necrotic tissue within the wound bed including Adherent Slough. The periwound skin appearance did not exhibit: Callus, Crepitus, Excoriation, Induration, Rash, Scarring, Dry/Scaly, Maceration, Atrophie Blanche, Cyanosis, Ecchymosis, Hemosiderin Staining, Mottled, Pallor, Rubor, Erythema. Periwound temperature was noted as No Abnormality. Assessment Active Problems ICD-10 L89.323 - Pressure ulcer of left buttock, stage 3 E44.0 - Moderate protein-calorie malnutrition R62.7 - Adult failure to thrive L89.154 - Pressure ulcer of sacral region, stage 4 L89.620 - Pressure ulcer of left heel, unstageable L89.890 - Pressure ulcer of other site, unstageable Guzek, Anda H. ([id number]) I10 - Essential (primary) hypertension Plan Wound Cleansing: Wound #1 Sacrum: Clean wound with Normal Saline. Cleanse wound with mild soap and water May Shower, gently pat wound dry prior to applying new dressing. Wound #2 Left Ischium: Clean wound with Normal Saline. Cleanse wound with mild soap and water May Shower, gently pat wound dry prior to applying new dressing. Wound #3 Left Calcaneus: Clean wound with Normal Saline. Cleanse wound with mild soap and water May Shower, gently pat wound dry prior to applying new dressing. Wound #4 Right,Lateral  Foot: Clean wound with Normal Saline. Cleanse wound with mild soap and water May Shower, gently pat wound dry prior to applying new dressing. Wound #5 Right,Medial Lower Leg: Clean wound with Normal Saline. Cleanse wound with mild soap and water May Shower, gently pat wound dry prior to applying new dressing. Wound #6 Left,Lateral Lower Leg: Clean wound with Normal Saline. Cleanse wound with mild soap and water May Shower, gently pat wound dry prior to applying new dressing. Skin Barriers/Peri-Wound Care: Wound #1 Sacrum: Skin Prep Wound #2 Left Ischium: Skin Prep Wound #3 Left Calcaneus: Skin Prep Wound #4 Right,Lateral Foot: Skin Prep Wound #5 Right,Medial Lower Leg: Skin Prep Wound #6 Left,Lateral Lower Leg: Skin Prep Primary Wound Dressing: Wound #1 Sacrum: Santyl Ointment Wound #2 Left Ischium: Santyl Ointment Wound #3 Left Calcaneus: Santyl Ointment Wound #4 Right,Lateral Foot: Santyl Ointment Wound #5 Right,Medial Lower Leg: Silvercel Non-Adherent - or equivalent Wound #6 Left,Lateral Lower Leg: KALIN, AMRHEIN ([id number]) Silvercel Non-Adherent - or equivalent Secondary Dressing: Wound #1 Sacrum: Dry Gauze Boardered Foam Dressing Wound #2 Left Ischium: Dry Gauze Boardered Foam Dressing XtraSorb - xtrasorb and carboflex pease for drainage and odor control Wound #3 Left Calcaneus: Dry Gauze Boardered Foam Dressing Wound #4 Right,Lateral Foot: Dry Gauze Boardered Foam Dressing Wound #5 Right,Medial Lower Leg: Dry Gauze Boardered Foam Dressing Wound #6 Left,Lateral Lower Leg: Dry Gauze Boardered Foam Dressing Dressing Change Frequency: Wound #1 Sacrum: Change dressing every day. Other: - as needed Wound #2 Left Ischium: Change dressing every day. Other: - as needed Wound #3 Left Calcaneus: Change dressing every day. Other: - as needed Wound #4 Right,Lateral Foot: Change dressing every day. Other: - as needed Wound #5 Right,Medial Lower  Leg: Change dressing every day. Other: - as needed Wound #6 Left,Lateral Lower Leg: Change dressing every day. Other: - as needed Follow-up Appointments: Other: - only if recommended by Hospice Off-Loading: Wound #1 Sacrum: Turn and reposition every 2 hours Other: - HOSPICE PLEASE PROVIDE AIR MATTRESS FOR PATIENT USE Wound #2 Left Ischium: Turn and reposition every 2 hours Other: - HOSPICE PLEASE PROVIDE AIR MATTRESS FOR PATIENT USE Wound #3 Left Calcaneus: Turn and reposition every 2 hours Other: - HOSPICE PLEASE PROVIDE AIR MATTRESS FOR PATIENT USE  Wound #4 Right,Lateral Foot: Turn and reposition every 2 hours Other: - HOSPICE PLEASE PROVIDE AIR MATTRESS FOR PATIENT USE Wound #5 Right,Medial Lower Leg: Turn and reposition every 2 hours Other: - HOSPICE PLEASE PROVIDE AIR MATTRESS FOR PATIENT USE Wound #6 Left,Lateral Lower Leg: Turn and reposition every 2 hours Sissel, Taneisha H. ([id number]) Other: - HOSPICE PLEASE PROVIDE AIR MATTRESS FOR PATIENT USE Additional Orders / Instructions: Wound #1 Sacrum: Increase protein intake. Wound #2 Left Ischium: Increase protein intake. Wound #3 Left Calcaneus: Increase protein intake. Wound #4 Right,Lateral Foot: Increase protein intake. Wound #5 Right,Medial Lower Leg: Increase protein intake. Wound #6 Left,Lateral Lower Leg: Increase protein intake. Home Health: Wound #1 Sacrum: Continue Home Health Visits - Hospice Home Health Nurse may visit PRN to address patient s wound care needs. FACE TO FACE ENCOUNTER: MEDICARE and MEDICAID PATIENTS: I certify that this patient is under my care and that I had a face-to-face encounter that meets the physician face-to-face encounter requirements with this patient on this date. The encounter with the patient was in whole or in part for the following MEDICAL CONDITION: (primary reason for Home Healthcare) MEDICAL NECESSITY: I certify, that based on my findings, NURSING services are a medically  necessary home health service. HOME BOUND STATUS: I certify that my clinical findings support that this patient is homebound (i.e., Due to illness or injury, pt requires aid of supportive devices such as crutches, cane, wheelchairs, walkers, the use of special transportation or the assistance of another person to leave their place of residence. There is a normal inability to leave the home and doing so requires considerable and taxing effort. Other absences are for medical reasons / religious services and are infrequent or of short duration when for other reasons). If current dressing causes regression in wound condition, may D/C ordered dressing product/s and apply Normal Saline Moist Dressing daily until next Wound Healing Center / Other MD appointment. Notify Wound Healing Center of regression in wound condition at [phone]. Please direct any NON-WOUND related issues/requests for orders to patient's Primary Care Physician Wound #2 Left Ischium: Continue Home Health Visits - Hospice Home Health Nurse may visit PRN to address patient s wound care needs. FACE TO FACE ENCOUNTER: MEDICARE and MEDICAID PATIENTS: I certify that this patient is under my care and that I had a face-to-face encounter that meets the physician face-to-face encounter requirements with this patient on this date. The encounter with the patient was in whole or in part for the following MEDICAL CONDITION: (primary reason for Home Healthcare) MEDICAL NECESSITY: I certify, that based on my findings, NURSING services are a medically necessary home health service. HOME BOUND STATUS: I certify that my clinical findings support that this patient is homebound (i.e., Due to illness or injury, pt requires aid of supportive devices such as crutches, cane, wheelchairs, walkers, the use of special transportation or the assistance of another person to leave their place of residence. There is a normal inability to leave the home and  doing so requires considerable and taxing effort. Other absences are for medical reasons / religious services and are infrequent or of short duration when for other reasons). If current dressing causes regression in wound condition, may D/C ordered dressing product/s and apply Normal Saline Moist Dressing daily until next Wound Healing Center / Other MD appointment. Notify Wound Healing Center of regression in wound condition at [phone]. Please direct any NON-WOUND related issues/requests for orders to patient's Primary Care Physician Wound #3 Left Calcaneus: Continue Home  Health Visits - Hospice Home Health Nurse may visit PRN to address patient s wound care needs. FACE TO FACE ENCOUNTER: MEDICARE and MEDICAID PATIENTS: I certify that this patient is under my care and that I had a face-to-face encounter that meets the physician face-to-face encounter requirements with this patient on this date. The encounter with the patient was in whole or in part for the following MEDICAL CONDITION: (primary reason for Home Healthcare) MEDICAL NECESSITY: I certify, that based on my findings, NURSING services are a medically necessary home health service. HOME BOUND STATUS: I certify that my clinical findings support that this patient is homebound (i.e., Due to illness or injury, pt requires aid of supportive devices such as crutches, cane, wheelchairs, walkers, the use of special transportation or the assistance of another person to leave their place of residence. There is a normal inability to leave the home and doing so requires considerable and taxing effort. Other absences are for medical reasons / religious services and are infrequent or of short duration when for other reasons). If current dressing causes regression in wound condition, may D/C ordered dressing product/s and apply Normal Saline ADELY, FACER. ([id number]) Moist Dressing daily until next Wound Healing Center / Other MD appointment.  Notify Wound Healing Center of regression in wound condition at [phone]. Please direct any NON-WOUND related issues/requests for orders to patient's Primary Care Physician Wound #4 Right,Lateral Foot: Continue Home Health Visits - Hospice Home Health Nurse may visit PRN to address patient s wound care needs. FACE TO FACE ENCOUNTER: MEDICARE and MEDICAID PATIENTS: I certify that this patient is under my care and that I had a face-to-face encounter that meets the physician face-to-face encounter requirements with this patient on this date. The encounter with the patient was in whole or in part for the following MEDICAL CONDITION: (primary reason for Home Healthcare) MEDICAL NECESSITY: I certify, that based on my findings, NURSING services are a medically necessary home health service. HOME BOUND STATUS: I certify that my clinical findings support that this patient is homebound (i.e., Due to illness or injury, pt requires aid of supportive devices such as crutches, cane, wheelchairs, walkers, the use of special transportation or the assistance of another person to leave their place of residence. There is a normal inability to leave the home and doing so requires considerable and taxing effort. Other absences are for medical reasons / religious services and are infrequent or of short duration when for other reasons). If current dressing causes regression in wound condition, may D/C ordered dressing product/s and apply Normal Saline Moist Dressing daily until next Wound Healing Center / Other MD appointment. Notify Wound Healing Center of regression in wound condition at [phone]. Please direct any NON-WOUND related issues/requests for orders to patient's Primary Care Physician Wound #5 Right,Medial Lower Leg: Continue Home Health Visits - Hospice Home Health Nurse may visit PRN to address patient s wound care needs. FACE TO FACE ENCOUNTER: MEDICARE and MEDICAID PATIENTS: I certify that  this patient is under my care and that I had a face-to-face encounter that meets the physician face-to-face encounter requirements with this patient on this date. The encounter with the patient was in whole or in part for the following MEDICAL CONDITION: (primary reason for Home Healthcare) MEDICAL NECESSITY: I certify, that based on my findings, NURSING services are a medically necessary home health service. HOME BOUND STATUS: I certify that my clinical findings support that this patient is homebound (i.e., Due to illness or injury, pt  requires aid of supportive devices such as crutches, cane, wheelchairs, walkers, the use of special transportation or the assistance of another person to leave their place of residence. There is a normal inability to leave the home and doing so requires considerable and taxing effort. Other absences are for medical reasons / religious services and are infrequent or of short duration when for other reasons). If current dressing causes regression in wound condition, may D/C ordered dressing product/s and apply Normal Saline Moist Dressing daily until next Wound Healing Center / Other MD appointment. Notify Wound Healing Center of regression in wound condition at [phone]. Please direct any NON-WOUND related issues/requests for orders to patient's Primary Care Physician Wound #6 Left,Lateral Lower Leg: Continue Home Health Visits - Hospice Home Health Nurse may visit PRN to address patient s wound care needs. FACE TO FACE ENCOUNTER: MEDICARE and MEDICAID PATIENTS: I certify that this patient is under my care and that I had a face-to-face encounter that meets the physician face-to-face encounter requirements with this patient on this date. The encounter with the patient was in whole or in part for the following MEDICAL CONDITION: (primary reason for Home Healthcare) MEDICAL NECESSITY: I certify, that based on my findings, NURSING services are a medically necessary  home health service. HOME BOUND STATUS: I certify that my clinical findings support that this patient is homebound (i.e., Due to illness or injury, pt requires aid of supportive devices such as crutches, cane, wheelchairs, walkers, the use of special transportation or the assistance of another person to leave their place of residence. There is a normal inability to leave the home and doing so requires considerable and taxing effort. Other absences are for medical reasons / religious services and are infrequent or of short duration when for other reasons). If current dressing causes regression in wound condition, may D/C ordered dressing product/s and apply Normal Saline Moist Dressing daily until next Wound Healing Center / Other MD appointment. Notify Wound Healing Center of regression in wound condition at [phone]. Please direct any NON-WOUND related issues/requests for orders to patient's Primary Care Physician Medications-please add to medication list.: Wound #1 Sacrum: Santyl Enzymatic Ointment Other: - Vitamin C, Zinc, MVI Wound #2 Left Ischium: Santyl Enzymatic Ointment Other: - Vitamin C, Zinc, MVI Wound #3 Left Calcaneus: Santyl Enzymatic Ointment KATHALENE, SPORER ([id number]) Other: - Vitamin C, Zinc, MVI Wound #4 Right,Lateral Foot: Santyl Enzymatic Ointment Other: - Vitamin C, Zinc, MVI The following medication(s) was prescribed: Santyl topical 250 unit/gram ointment ointment topical as directed starting 08/22/2017 The patient is under hospice care and has hospice nurses visiting her regularly and has caregivers 24/7. She has overall deteriorated significantly and at this stage I have recommended, that palliative care and hospice care can be given at home by the hospice nurses and they did not need to make the trip to the wound center. I have had a very detailed discussion with the patient's caregiver and answered all her questions and have recommended: 1. Santyl  ointment locally to be applied daily. 2. I have discussed at length the need for proper offloading, 3. adequate protein, vitamin A, vitamin C and zinc and 4. appropriate surfaces for her bed and wheelchair. we will see her at the wound center as often as it is possible to get her her Electronic Signature(s) Signed: 08/22/2017 4:00:58 PM By: Evlyn Kanner MD, FACS Entered By: Evlyn Kanner on 08/22/2017 16:00:57 Theresa Frank ([id number]) -------------------------------------------------------------------------------- ROS/PFSH Details Patient Name: Theresa Frank Date of Service:  08/22/2017 2:30 PM Medical Record Number: [id number] Patient Account Number: [id number] Date of Birth/Sex: 06-06-1925 (83 y.o. Female) Treating RN: Curtis Sites Primary Care Provider: BABAOFF, MARCUS Other Clinician: Referring Provider: BABAOFF, MARCUS Treating Provider/Extender: Rudene Re in Treatment: 7 Unable to Obtain Patient History due to oo Dementia Information Obtained From Patient Wound History Do you currently have one or more open woundso Yes How many open wounds do you currently haveo 1 Approximately how long have you had your woundso 2 months How have you been treating your wound(s) until nowo bandage Has your wound(s) ever healed and then re-openedo No Have you had any lab work done in the past montho No Have you tested positive for an antibiotic resistant organism (MRSA, VRE)o No Have you tested positive for osteomyelitis (bone infection)o No Immunizations Pneumococcal Vaccine: Received Pneumococcal Vaccination: No Implantable Devices Family and Social History Cancer: Yes - Mother; Diabetes: No; Heart Disease: Yes - Child; Hereditary Spherocytosis: No; Hypertension: No; Kidney Disease: No; Lung Disease: No; Seizures: No; Stroke: No; Thyroid Problems: No; Tuberculosis: No; Never smoker; Marital Status - Married; Alcohol Use: Never; Drug Use: No History; Caffeine Use: Never;  Financial Concerns: No; Food, Clothing or Shelter Needs: No; Support System Lacking: No; Transportation Concerns: No; Advanced Directives: Yes (Not Provided); Patient does not want information on Advanced Directives; Do not resuscitate: Yes (Not Provided); Living Will: No; Medical Power of Attorney: No Physician Affirmation I have reviewed and agree with the above information. Electronic Signature(s) Signed: 08/22/2017 4:44:52 PM By: Evlyn Kanner MD, FACS Signed: 08/22/2017 5:10:36 PM By: Curtis Sites Entered By: Evlyn Kanner on 08/22/2017 15:57:24 Theresa Frank ([id number]) -------------------------------------------------------------------------------- SuperBill Details Patient Name: Theresa Frank Date of Service: 08/22/2017 Medical Record Number: [id number] Patient Account Number: [id number] Date of Birth/Sex: 1925/01/02 (83 y.o. 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 Pressure ulcer of left heel, unstageable L89.890 Pressure ulcer of other site, unstageable I10 Essential (primary) hypertension Facility Procedures CPT4 Code: [id number] Description: 84132 - WOUND CARE VISIT-LEV 5 EST PT Modifier: Quantity: 1 Physician Procedures CPT4 Code: [id number] Description: 99213 - WC PHYS LEVEL 3 - EST PT ICD-10 Diagnosis Description L89.323 Pressure ulcer of left buttock, 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

## 2017-08-23 NOTE — Progress Notes (Addendum)
[name], [name] ([id number]) Visit Report for 08/22/2017 Arrival Information Details Patient Name: Theresa Frank, Theresa Frank. Date of Service: 08/22/2017 2:30 PM Medical Record Number: [id number] Patient Account Number: [id number] Date of Birth/Sex: April 21, 1925 (83 y.o. Female) Treating RN: Curtis Sites Primary Care Analyce Tavares: BABAOFF, MARCUS Other Clinician: Referring Inigo Lantigua: BABAOFF, MARCUS Treating Bernhardt Riemenschneider/Extender: Rudene Re in Treatment: 7 Visit Information History Since Last Visit Added or deleted any medications: No Patient Arrived: Wheel Chair Any new allergies or adverse reactions: No Arrival Time: 14:51 Had a fall or experienced change in No activities of daily living that may affect Accompanied By: caregiver risk of falls: Transfer Assistance: Manual Signs or symptoms of abuse/neglect since last visito No Patient Identification Verified: Yes Hospitalized since last visit: No Secondary Verification Process Completed: Yes Has Dressing in Place as Prescribed: Yes Patient Requires Transmission-Based No Pain Present Now: No Precautions: Patient Has Alerts: No Electronic Signature(s) Signed: 08/22/2017 5:10:36 PM By: Curtis Sites Entered By: Curtis Sites on 08/22/2017 14:51:25 Theresa Frank ([id number]) -------------------------------------------------------------------------------- Clinic Level of Care Assessment Details Patient Name: Theresa Frank Date of Service: 08/22/2017 2:30 PM Medical Record Number: [id number] Patient Account Number: [id number] Date of Birth/Sex: [birth year] (83 y.o. Female) Treating RN: Curtis Sites Primary Care Carlton Sweaney: BABAOFF, MARCUS Other Clinician: Referring Peg Fifer: BABAOFF, MARCUS Treating Tamarcus Condie/Extender: Rudene Re in Treatment: 7 Clinic Level of Care Assessment Items TOOL 4 Quantity Score []  - Use when only an EandM is performed on FOLLOW-UP visit 0 ASSESSMENTS - Nursing Assessment / Reassessment X -  Reassessment of Co-morbidities (includes updates in patient status) 1 10 X- 1 5 Reassessment of Adherence to Treatment Plan ASSESSMENTS - Wound and Skin Assessment / Reassessment []  - Simple Wound Assessment / Reassessment - one wound 0 X- 6 5 Complex Wound Assessment / Reassessment - multiple wounds []  - 0 Dermatologic / Skin Assessment (not related to wound area) ASSESSMENTS - Focused Assessment []  - Circumferential Edema Measurements - multi extremities 0 []  - 0 Nutritional Assessment / Counseling / Intervention X- 1 5 Lower Extremity Assessment (monofilament, tuning fork, pulses) X- 1 10 Peripheral Arterial Disease Assessment (using hand held doppler) ASSESSMENTS - Ostomy and/or Continence Assessment and Care []  - Incontinence Assessment and Management 0 []  - 0 Ostomy Care Assessment and Management (repouching, etc.) PROCESS - Coordination of Care X - Simple Patient / Family Education for ongoing care 1 15 []  - 0 Complex (extensive) Patient / Family Education for ongoing care []  - 0 Staff obtains Chiropractor, Records, Test Results / Process Orders []  - 0 Staff telephones HHA, Nursing Homes / Clarify orders / etc []  - 0 Routine Transfer to another Facility (non-emergent condition) []  - 0 Routine Hospital Admission (non-emergent condition) []  - 0 New Admissions / Manufacturing engineer / Ordering NPWT, Apligraf, etc. []  - 0 Emergency Hospital Admission (emergent condition) X- 1 10 Simple Discharge Coordination [name], [name] ([id number]) []  - 0 Complex (extensive) Discharge Coordination PROCESS - Special Needs []  - Pediatric / Minor Patient Management 0 []  - 0 Isolation Patient Management []  - 0 Hearing / Language / Visual special needs []  - 0 Assessment of Community assistance (transportation, D/C planning, etc.) []  - 0 Additional assistance / Altered mentation []  - 0 Support Surface(s) Assessment (bed, cushion, seat, etc.) INTERVENTIONS - Wound Cleansing /  Measurement []  - Simple Wound Cleansing - one wound 0 X- 6 5 Complex Wound Cleansing - multiple wounds X- 1 5 Wound Imaging (photographs - any number of wounds) []  - 0 Wound Tracing (instead of  photographs) []  - 0 Simple Wound Measurement - one wound X- 6 5 Complex Wound Measurement - multiple wounds INTERVENTIONS - Wound Dressings X - Small Wound Dressing one or multiple wounds 6 10 []  - 0 Medium Wound Dressing one or multiple wounds []  - 0 Large Wound Dressing one or multiple wounds X- 1 5 Application of Medications - topical []  - 0 Application of Medications - injection INTERVENTIONS - Miscellaneous []  - External ear exam 0 []  - 0 Specimen Collection (cultures, biopsies, blood, body fluids, etc.) []  - 0 Specimen(s) / Culture(s) sent or taken to Lab for analysis []  - 0 Patient Transfer (multiple staff / Nurse, adult / Similar devices) []  - 0 Simple Staple / Suture removal (25 or less) []  - 0 Complex Staple / Suture removal (26 or more) []  - 0 Hypo / Hyperglycemic Management (close monitor of Blood Glucose) X- 1 15 Ankle / Brachial Index (ABI) - do not check if billed separately X- 1 5 Vital Signs [name], [name] ([id number]) Has the patient been seen at the hospital within the last three years: Yes Total Score: 235 Level Of Care: New/Established - Level 5 Electronic Signature(s) Signed: 08/22/2017 5:10:36 PM By: Curtis Sites Entered By: Curtis Sites on 08/22/2017 16:11:13 Theresa Frank ([id number]) -------------------------------------------------------------------------------- Complex / Palliative Patient Assessment Details Patient Name: Theresa Frank Date of Service: 08/22/2017 2:30 PM Medical Record Number: [id number] Patient Account Number: [id number] Date of Birth/Sex: 02-02-1925 (83 y.o. Female) Treating RN: Huel Coventry Primary Care Morissa Obeirne: Kandyce Rud Other Clinician: Referring Oaklen Thiam: BABAOFF, MARCUS Treating Robyne Matar/Extender: Rudene Re in Treatment: 7 Palliative Management Criteria Patient has a living will that specifies no extraordinary measures and advanced wound treatment would expose the patient to those extraordinary interventions. Complex Wound Management Criteria Care Approach Wound Care Plan: Palliative Wound Management Electronic Signature(s) Signed: 08/26/2017 8:58:57 Theresa Frank By: Elliot Gurney, BSN, RN, CWS, Kim RN, BSN Signed: 09/01/2017 8:11:14 Theresa Frank By: Evlyn Kanner MD, FACS Entered By: Elliot Gurney, BSN, RN, CWS, Kim on 08/26/2017 08:58:57 Theresa Frank ([id number]) -------------------------------------------------------------------------------- Encounter Discharge Information Details Patient Name: DAPHNA, LAFUENTE. Date of Service: 08/22/2017 2:30 PM Medical Record Number: [id number] Patient Account Number: [id number] Date of Birth/Sex: [birth year] (83 y.o. Female) Treating RN: Curtis Sites Primary Care Iniya Matzek: BABAOFF, MARCUS Other Clinician: Referring Kendria Halberg: BABAOFF, MARCUS Treating Satrina Magallanes/Extender: Rudene Re in Treatment: 7 Encounter Discharge Information Items Discharge Pain Level: 0 Discharge Condition: Stable Ambulatory Status: Wheelchair Discharge Destination: Home Transportation: Private Auto Accompanied By: caregiver Schedule Follow-up Appointment: No Medication Reconciliation completed and No provided to Patient/Care Viraj Liby: Provided on Clinical Summary of Care: 08/22/2017 Form Type Recipient Paper Patient RZ Electronic Signature(s) Signed: 08/22/2017 4:12:36 PM By: Curtis Sites Entered By: Curtis Sites on 08/22/2017 16:12:36 Theresa Frank ([id number]) -------------------------------------------------------------------------------- Lower Extremity Assessment Details Patient Name: Theresa Frank Date of Service: 08/22/2017 2:30 PM Medical Record Number: [id number] Patient Account Number: [id number] Date of Birth/Sex: [birth year] (83 y.o. Female) Treating RN:  Curtis Sites Primary Care Normalee Sistare: BABAOFF, MARCUS Other Clinician: Referring Aydin Cavalieri: BABAOFF, MARCUS Treating Cienna Dumais/Extender: Rudene Re in Treatment: 7 Vascular Assessment Pulses: Dorsalis Pedis Palpable: [Left:Yes] [Right:Yes] Doppler Audible: [Left:Yes] [Right:Yes] Posterior Tibial Palpable: [Left:No] [Right:No] Doppler Audible: [Left:Yes] [Right:Yes] Extremity colors, hair growth, and conditions: Extremity Color: [Left:Normal] [Right:Red] Hair Growth on Extremity: [Left:No] [Right:No] Temperature of Extremity: [Left:Warm] [Right:Warm] Capillary Refill: [Left:< 3 seconds] [Right:< 3 seconds] Blood Pressure: Brachial: [Left:108] Dorsalis Pedis: 88 [Left:Dorsalis Pedis: 126] Ankle: Posterior Tibial: [Left:Posterior Tibial: 0.81] [Right:1.17] Electronic Signature(s) Signed: 08/22/2017 5:10:36 PM  By: Curtis Sites Entered By: Curtis Sites on 08/22/2017 15:23:43 Theresa Frank ([id number]) -------------------------------------------------------------------------------- Multi Wound Chart Details Patient Name: Theresa Frank Date of Service: 08/22/2017 2:30 PM Medical Record Number: [id number] Patient Account Number: [id number] Date of Birth/Sex: Apr 17, 1925 (83 y.o. Female) Treating RN: Curtis Sites Primary Care Valma Rotenberg: BABAOFF, MARCUS Other Clinician: Referring Octave Montrose: BABAOFF, MARCUS Treating Shericka Johnstone/Extender: Rudene Re in Treatment: 7 Vital Signs Height(in): 68 Pulse(bpm): 54 Weight(lbs): 108 Blood Pressure(mmHg): 106/63 Body Mass Index(BMI): 16 Temperature(F): 97.6 Respiratory Rate 18 (breaths/min): Photos: [1:No Photos] [2:No Photos] [3:No Photos] Wound Location: [1:Sacrum] [2:Left Ischium] [3:Left Calcaneus] Wounding Event: [1:Pressure Injury] [2:Gradually Appeared] [3:Gradually Appeared] Primary Etiology: [1:Pressure Ulcer] [2:Pressure Ulcer] [3:Pressure Ulcer] Date Acquired: [1:05/01/2017] [2:08/01/2017]  [3:08/01/2017] Weeks of Treatment: [1:7] [2:2] [3:2] Wound Status: [1:Open] [2:Open] [3:Open] Measurements L x W x D [1:5.5x1.7x0.8] [2:3.4x4.8x0.7] [3:2.3x4.2x0.2] (cm) Area (cm) : [1:7.343] [2:12.818] [3:7.587] Volume (cm) : [1:5.875] [2:8.972] [3:1.517] % Reduction in Area: [1:55.60%] [2:-343.50%] [3:-21.10%] % Reduction in Volume: [1:11.30%] [2:-3004.50%] [3:-21.10%] Starting Position 1 [1:12] (o'clock): Ending Position 1 [1:5] (o'clock): Maximum Distance 1 (cm): [1:2.9] Undermining: [1:Yes] [2:No] [3:No] Classification: [1:Category/Stage III] [2:Category/Stage III] [3:Category/Stage III] Exudate Amount: [1:Large] [2:Large] [3:Large] Exudate Type: [1:Serous] [2:Serous] [3:Serous] Exudate Color: [1:amber] [2:amber] [3:amber] Wound Margin: [1:Distinct, outline attached] [2:Flat and Intact] [3:Flat and Intact] Granulation Amount: [1:Small (1-33%)] [2:Small (1-33%)] [3:None Present (0%)] Granulation Quality: [1:Red] [2:Pink] [3:N/A] Necrotic Amount: [1:Large (67-100%)] [2:Large (67-100%)] [3:Large (67-100%)] Necrotic Tissue: [1:Eschar, Adherent Slough] [2:Eschar, Adherent Slough] [3:Eschar, Adherent Slough] Exposed Structures: [1:Fat Layer (Subcutaneous Tissue) Exposed: Yes Fascia: No Tendon: No Muscle: No Joint: No Bone: No] [2:Fat Layer (Subcutaneous Tissue) Exposed: Yes Fascia: No Tendon: No Muscle: No Joint: No Bone: No] [3:Fat Layer (Subcutaneous Tissue) Exposed: Yes  Fascia: No Tendon: No Muscle: No Joint: No Bone: No] Epithelialization: [1:None] [2:None] [3:None] Periwound Skin Texture: [1:No Abnormalities Noted] Excoriation: No Excoriation: No Induration: No Induration: No Callus: No Callus: No Crepitus: No Crepitus: No Rash: No Rash: No Scarring: No Scarring: No Periwound Skin Moisture: Maceration: Yes Maceration: No Maceration: No Dry/Scaly: No Dry/Scaly: No Periwound Skin Color: Erythema: Yes Atrophie Blanche: No Atrophie Blanche: No Cyanosis:  No Cyanosis: No Ecchymosis: No Ecchymosis: No Erythema: No Erythema: No Hemosiderin Staining: No Hemosiderin Staining: No Mottled: No Mottled: No Pallor: No Pallor: No Rubor: No Rubor: No Erythema Location: Circumferential N/A N/A Temperature: No Abnormality No Abnormality No Abnormality Tenderness on Palpation: Yes Yes Yes Wound Preparation: Ulcer Cleansing: Ulcer Cleansing: Ulcer Cleansing: Rinsed/Irrigated with Saline Rinsed/Irrigated with Saline Rinsed/Irrigated with Saline Topical Anesthetic Applied: Topical Anesthetic Applied: Topical Anesthetic Applied: Other: lidocaine 4% Other: lidocaine 4% Other: lidocaine 4% Wound Number: [id number]  Photos: No Photos No Photos No Photos Wound Location: Right Foot - Lateral Right Lower Leg - Medial Left Lower Leg - Lateral Wounding Event: Gradually Appeared Blister Blister Primary Etiology: Pressure Ulcer Venous Leg Ulcer Venous Leg Ulcer Date Acquired: 08/01/2017 08/18/2017 08/18/2017 Weeks of Treatment: 2 0 0 Wound Status: Open Open Open Measurements L x W x D 1x1.1x0.2 5.3x4.5x0.1 4.5x5.4x0.1 (cm) Area (cm) : 0.864 18.732 19.085 Volume (cm) : 0.173 1.873 1.909 % Reduction in Area: -340.80% N/A N/A % Reduction in Volume: -343.60% N/A N/A Undermining: No No No Classification: Category/Stage III Partial Thickness Partial Thickness Exudate Amount: Large Large Large Exudate Type: Serous Serous Serous Exudate Color: amber amber amber Wound Margin: Flat and Intact Flat and Intact Flat and Intact Granulation Amount: Small (1-33%) Large (67-100%) Large (67-100%) Granulation Quality: Pink Pink Pink Necrotic Amount: Large (67-100%)  Small (1-33%) Small (1-33%) Necrotic Tissue: Eschar, Adherent Slough Adherent Colgate-PalmoliveSlough Adherent Slough Exposed Structures: Fat Layer (Subcutaneous Fascia: No Fascia: No Tissue) Exposed: Yes Fat Layer (Subcutaneous Fat Layer (Subcutaneous Fascia: No Tissue) Exposed: No Tissue) Exposed: No Tendon:  No Tendon: No Tendon: No Muscle: No Muscle: No Muscle: No Joint: No Joint: No Joint: No Bone: No Bone: No Bone: No Epithelialization: None None None Theresa Frank, Theresa H. ([id number]) Periwound Skin Texture: Excoriation: No Excoriation: No Excoriation: No Induration: No Induration: No Induration: No Callus: No Callus: No Callus: No Crepitus: No Crepitus: No Crepitus: No Rash: No Rash: No Rash: No Scarring: No Scarring: No Scarring: No Periwound Skin Moisture: Maceration: No Maceration: No Maceration: No Dry/Scaly: No Dry/Scaly: No Dry/Scaly: No Periwound Skin Color: Atrophie Blanche: No Atrophie Blanche: No Atrophie Blanche: No Cyanosis: No Cyanosis: No Cyanosis: No Ecchymosis: No Ecchymosis: No Ecchymosis: No Erythema: No Erythema: No Erythema: No Hemosiderin Staining: No Hemosiderin Staining: No Hemosiderin Staining: No Mottled: No Mottled: No Mottled: No Pallor: No Pallor: No Pallor: No Rubor: No Rubor: No Rubor: No Erythema Location: N/A N/A N/A Temperature: No Abnormality No Abnormality No Abnormality Tenderness on Palpation: Yes No No Wound Preparation: Ulcer Cleansing: Ulcer Cleansing: Ulcer Cleansing: Rinsed/Irrigated with Saline Rinsed/Irrigated with Saline Rinsed/Irrigated with Saline Topical Anesthetic Applied: Topical Anesthetic Applied: Topical Anesthetic Applied: Other: lidocaine 4% None None Treatment Notes Electronic Signature(s) Signed: 08/22/2017 3:56:19 PM By: Evlyn KannerBritto, Errol MD, FACS Entered By: Evlyn KannerBritto, Errol on 08/22/2017 15:56:19 Theresa Frank, Theresa H. ([id number]) -------------------------------------------------------------------------------- Multi-Disciplinary Care Plan Details Patient Name: Theresa Frank, Marchetta H. Date of Service: 08/22/2017 2:30 PM Medical Record Number: [id number] Patient Account Number: [id number] Date of Birth/Sex: 01/29/1925 (83 y.o. Female) Treating RN: Curtis Sitesorthy, Joanna Primary Care Christo Hain: Kandyce RudBABAOFF, MARCUS  Other Clinician: Referring Mckena Chern: BABAOFF, MARCUS Treating Iyahna Obriant/Extender: Rudene ReBritto, Errol Weeks in Treatment: 7 Active Inactive Electronic Signature(s) Signed: 09/16/2017 4:43:51 PM By: Elliot GurneyWoody, BSN, RN, CWS, Kim RN, BSN Signed: 09/28/2017 4:21:33 PM By: Curtis Sitesorthy, Joanna Previous Signature: 08/22/2017 5:10:36 PM Version By: Curtis Sitesorthy, Joanna Entered By: Elliot GurneyWoody, BSN, RN, CWS, Kim on 09/16/2017 16:43:51 Theresa Frank, Theresa H. ([id number]) -------------------------------------------------------------------------------- Pain Assessment Details Patient Name: Theresa Frank, Alajia H. Date of Service: 08/22/2017 2:30 PM Medical Record Number: [id number] Patient Account Number: [id number] Date of Birth/Sex: 02/01/1925 (83 y.o. Female) Treating RN: Curtis Sitesorthy, Joanna Primary Care Chivas Notz: Kandyce RudBABAOFF, MARCUS Other Clinician: Referring Allahna Husband: BABAOFF, MARCUS Treating Ruthia Person/Extender: Rudene ReBritto, Errol Weeks in Treatment: 7 Active Problems Location of Pain Severity and Description of Pain Patient Has Paino No Site Locations Pain Management and Medication Current Pain Management: Electronic Signature(s) Signed: 08/22/2017 5:10:36 PM By: Curtis Sitesorthy, Joanna Entered By: Curtis Sitesorthy, Joanna on 08/22/2017 14:53:47 Theresa Frank, Theresa H. ([id number]) -------------------------------------------------------------------------------- Patient/Caregiver Education Details Patient Name: Theresa Frank, Anberlin H. Date of Service: 08/22/2017 2:30 PM Medical Record Number: [id number] Patient Account Number: [id number] Date of Birth/Gender: 05/08/1925 (83 y.o. Female) Treating RN: Curtis Sitesorthy, Joanna Primary Care Physician: BABAOFF, MARCUS Other Clinician: Referring Physician: BABAOFF, MARCUS Treating Physician/Extender: Rudene ReBritto, Errol Weeks in Treatment: 7 Education Assessment Education Provided To: Caregiver Education Topics Provided Wound/Skin Impairment: Handouts: Other: wound care as ordered and return if needed Methods: Demonstration,  Explain/Verbal Responses: State content correctly Electronic Signature(s) Signed: 08/22/2017 5:10:36 PM By: Curtis Sitesorthy, Joanna Entered By: Curtis Sitesorthy, Joanna on 08/22/2017 16:13:00 Theresa Frank, Theresa H. ([id number]) -------------------------------------------------------------------------------- Wound Assessment Details Patient Name: Theresa Frank, Theresa H. Date of Service: 08/22/2017 2:30 PM Medical Record Number: [id number] Patient Account Number: [id number] Date of Birth/Sex: 02/15/1925 (83 y.o. Female) Treating RN: Curtis Sitesorthy, Joanna Primary Care Davin Muramoto: Kandyce RudBABAOFF, MARCUS Other Clinician: Referring Hargis Vandyne: BABAOFF, MARCUS  Treating Jamien Casanova/Extender: Rudene Re in Treatment: 7 Wound Status Wound Number: 1 Primary Etiology: Pressure Ulcer Wound Location: Sacrum Wound Status: Open Wounding Event: Pressure Injury Date Acquired: 05/01/2017 Weeks Of Treatment: 7 Clustered Wound: No Photos Photo Uploaded By: Curtis Sites on 08/22/2017 17:07:01 Wound Measurements Length: (cm) 5.5 Width: (cm) 1.7 Depth: (cm) 0.8 Area: (cm) 7.343 Volume: (cm) 5.875 % Reduction in Area: 55.6% % Reduction in Volume: 11.3% Epithelialization: None Tunneling: No Undermining: Yes Starting Position (o'clock): 12 Ending Position (o'clock): 5 Maximum Distance: (cm) 2.9 Wound Description Classification: Category/Stage III Wound Margin: Distinct, outline attached Exudate Amount: Large Exudate Type: Serous Exudate Color: amber Foul Odor After Cleansing: No Slough/Fibrino Yes Wound Bed Granulation Amount: Small (1-33%) Exposed Structure Granulation Quality: Red Fascia Exposed: No Necrotic Amount: Large (67-100%) Fat Layer (Subcutaneous Tissue) Exposed: Yes Necrotic Quality: Eschar, Adherent Slough Tendon Exposed: No Muscle Exposed: No Theresa Frank, AMAN ([id number]) Joint Exposed: No Bone Exposed: No Periwound Skin Texture Texture Color No Abnormalities Noted: No No Abnormalities Noted: No Erythema:  Yes Moisture Erythema Location: Circumferential No Abnormalities Noted: No Maceration: Yes Temperature / Pain Temperature: No Abnormality Tenderness on Palpation: Yes Wound Preparation Ulcer Cleansing: Rinsed/Irrigated with Saline Topical Anesthetic Applied: Other: lidocaine 4%, Treatment Notes Wound #1 (Sacrum) 1. Cleansed with: Clean wound with Normal Saline 2. Anesthetic Topical Lidocaine 4% cream to wound bed prior to debridement 4. Dressing Applied: Santyl Ointment 5. Secondary Dressing Applied Bordered Foam Dressing Dry Gauze Electronic Signature(s) Signed: 08/22/2017 5:10:36 PM By: Curtis Sites Entered By: Curtis Sites on 08/22/2017 15:06:42 Theresa Frank ([id number]) -------------------------------------------------------------------------------- Wound Assessment Details Patient Name: Theresa Frank Date of Service: 08/22/2017 2:30 PM Medical Record Number: [id number] Patient Account Number: [id number] Date of Birth/Sex: 08/23/1925 (83 y.o. Female) Treating RN: Curtis Sites Primary Care Shajuan Musso: BABAOFF, MARCUS Other Clinician: Referring Paloma Grange: BABAOFF, MARCUS Treating Haddon Fyfe/Extender: Rudene Re in Treatment: 7 Wound Status Wound Number: 2 Primary Etiology: Pressure Ulcer Wound Location: Left Ischium Wound Status: Open Wounding Event: Gradually Appeared Date Acquired: 08/01/2017 Weeks Of Treatment: 2 Clustered Wound: No Photos Photo Uploaded By: Curtis Sites on 08/22/2017 17:07:39 Wound Measurements Length: (cm) 3.4 Width: (cm) 4.8 Depth: (cm) 0.7 Area: (cm) 12.818 Volume: (cm) 8.972 % Reduction in Area: -343.5% % Reduction in Volume: -3004.5% Epithelialization: None Tunneling: No Undermining: No Wound Description Classification: Category/Stage III Foul O Wound Margin: Flat and Intact Slough Exudate Amount: Large Exudate Type: Serous Exudate Color: amber dor After Cleansing: No /Fibrino Yes Wound Bed Granulation  Amount: Small (1-33%) Exposed Structure Granulation Quality: Pink Fascia Exposed: No Necrotic Amount: Large (67-100%) Fat Layer (Subcutaneous Tissue) Exposed: Yes Necrotic Quality: Eschar, Adherent Slough Tendon Exposed: No Muscle Exposed: No Joint Exposed: No Bone Exposed: No Periwound Skin Texture Murrell, Laurynn H. ([id number]) Texture Color No Abnormalities Noted: No No Abnormalities Noted: No Callus: No Atrophie Blanche: No Crepitus: No Cyanosis: No Excoriation: No Ecchymosis: No Induration: No Erythema: No Rash: No Hemosiderin Staining: No Scarring: No Mottled: No Pallor: No Moisture Rubor: No No Abnormalities Noted: No Dry / Scaly: No Temperature / Pain Maceration: No Temperature: No Abnormality Tenderness on Palpation: Yes Wound Preparation Ulcer Cleansing: Rinsed/Irrigated with Saline Topical Anesthetic Applied: Other: lidocaine 4%, Treatment Notes Wound #2 (Left Ischium) 1. Cleansed with: Clean wound with Normal Saline 2. Anesthetic Topical Lidocaine 4% cream to wound bed prior to debridement 4. Dressing Applied: Santyl Ointment 5. Secondary Dressing Applied Bordered Foam Dressing Dry Gauze Electronic Signature(s) Signed: 08/22/2017 5:10:36 PM By: Curtis Sites Entered By: Curtis Sites on  08/22/2017 15:08:04 NAVEA, WOODROW ([id number]) -------------------------------------------------------------------------------- Wound Assessment Details Patient Name: Theresa Frank, ROTTMAN. Date of Service: 08/22/2017 2:30 PM Medical Record Number: [id number] Patient Account Number: [id number] Date of Birth/Sex: 09-25-1924 (83 y.o. Female) Treating RN: Curtis Sites Primary Care Tank Difiore: BABAOFF, MARCUS Other Clinician: Referring Ole Lafon: BABAOFF, MARCUS Treating Kadden Osterhout/Extender: Rudene Re in Treatment: 7 Wound Status Wound Number: 3 Primary Etiology: Pressure Ulcer Wound Location: Left Calcaneus Wound Status: Open Wounding Event: Gradually  Appeared Date Acquired: 08/01/2017 Weeks Of Treatment: 2 Clustered Wound: No Photos Photo Uploaded By: Curtis Sites on 08/22/2017 17:07:40 Wound Measurements Length: (cm) 2.3 Width: (cm) 4.2 Depth: (cm) 0.2 Area: (cm) 7.587 Volume: (cm) 1.517 % Reduction in Area: -21.1% % Reduction in Volume: -21.1% Epithelialization: None Tunneling: No Undermining: No Wound Description Classification: Category/Stage III Wound Margin: Flat and Intact Exudate Amount: Large Exudate Type: Serous Exudate Color: amber Foul Odor After Cleansing: No Slough/Fibrino Yes Wound Bed Granulation Amount: None Present (0%) Exposed Structure Necrotic Amount: Large (67-100%) Fascia Exposed: No Necrotic Quality: Eschar, Adherent Slough Fat Layer (Subcutaneous Tissue) Exposed: Yes Tendon Exposed: No Muscle Exposed: No Joint Exposed: No Bone Exposed: No Periwound Skin Texture Vanalstine, Rola H. ([id number]) Texture Color No Abnormalities Noted: No No Abnormalities Noted: No Callus: No Atrophie Blanche: No Crepitus: No Cyanosis: No Excoriation: No Ecchymosis: No Induration: No Erythema: No Rash: No Hemosiderin Staining: No Scarring: No Mottled: No Pallor: No Moisture Rubor: No No Abnormalities Noted: No Dry / Scaly: No Temperature / Pain Maceration: No Temperature: No Abnormality Tenderness on Palpation: Yes Wound Preparation Ulcer Cleansing: Rinsed/Irrigated with Saline Topical Anesthetic Applied: Other: lidocaine 4%, Treatment Notes Wound #3 (Left Calcaneus) 1. Cleansed with: Clean wound with Normal Saline 2. Anesthetic Topical Lidocaine 4% cream to wound bed prior to debridement 4. Dressing Applied: Santyl Ointment 5. Secondary Dressing Applied Bordered Foam Dressing Dry Gauze Electronic Signature(s) Signed: 08/22/2017 5:10:36 PM By: Curtis Sites Entered By: Curtis Sites on 08/22/2017 15:09:45 Theresa Frank  ([id number]) -------------------------------------------------------------------------------- Wound Assessment Details Patient Name: Theresa Frank Date of Service: 08/22/2017 2:30 PM Medical Record Number: [id number] Patient Account Number: [id number] Date of Birth/Sex: August 21, 1925 (83 y.o. Female) Treating RN: Curtis Sites Primary Care Brentyn Seehafer: BABAOFF, MARCUS Other Clinician: Referring Niaomi Cartaya: BABAOFF, MARCUS Treating Guillermo Difrancesco/Extender: Rudene Re in Treatment: 7 Wound Status Wound Number: 4 Primary Etiology: Pressure Ulcer Wound Location: Right Foot - Lateral Wound Status: Open Wounding Event: Gradually Appeared Date Acquired: 08/01/2017 Weeks Of Treatment: 2 Clustered Wound: No Photos Photo Uploaded By: Curtis Sites on 08/22/2017 17:08:21 Wound Measurements Length: (cm) 1 Width: (cm) 1.1 Depth: (cm) 0.2 Area: (cm) 0.864 Volume: (cm) 0.173 % Reduction in Area: -340.8% % Reduction in Volume: -343.6% Epithelialization: None Tunneling: No Undermining: No Wound Description Classification: Category/Stage III Wound Margin: Flat and Intact Exudate Amount: Large Exudate Type: Serous Exudate Color: amber Foul Odor After Cleansing: No Slough/Fibrino Yes Wound Bed Granulation Amount: Small (1-33%) Exposed Structure Granulation Quality: Pink Fascia Exposed: No Necrotic Amount: Large (67-100%) Fat Layer (Subcutaneous Tissue) Exposed: Yes Necrotic Quality: Eschar, Adherent Slough Tendon Exposed: No Muscle Exposed: No Joint Exposed: No Bone Exposed: No Periwound Skin Texture Weil, Sharise H. ([id number]) Texture Color No Abnormalities Noted: No No Abnormalities Noted: No Callus: No Atrophie Blanche: No Crepitus: No Cyanosis: No Excoriation: No Ecchymosis: No Induration: No Erythema: No Rash: No Hemosiderin Staining: No Scarring: No Mottled: No Pallor: No Moisture Rubor: No No Abnormalities Noted: No Dry / Scaly: No Temperature /  Pain Maceration: No Temperature: No Abnormality Tenderness on  Palpation: Yes Wound Preparation Ulcer Cleansing: Rinsed/Irrigated with Saline Topical Anesthetic Applied: Other: lidocaine 4%, Treatment Notes Wound #4 (Right, Lateral Foot) 1. Cleansed with: Clean wound with Normal Saline 2. Anesthetic Topical Lidocaine 4% cream to wound bed prior to debridement 4. Dressing Applied: Santyl Ointment 5. Secondary Dressing Applied Bordered Foam Dressing Dry Gauze Electronic Signature(s) Signed: 08/22/2017 5:10:36 PM By: Curtis Sites Entered By: Curtis Sites on 08/22/2017 15:10:36 Theresa Frank ([id number]) -------------------------------------------------------------------------------- Wound Assessment Details Patient Name: Theresa Frank Date of Service: 08/22/2017 2:30 PM Medical Record Number: [id number] Patient Account Number: [id number] Date of Birth/Sex: 1925-04-20 (83 y.o. Female) Treating RN: Curtis Sites Primary Care Tracer Gutridge: BABAOFF, MARCUS Other Clinician: Referring Alzina Golda: BABAOFF, MARCUS Treating Sherwin Hollingshed/Extender: Rudene Re in Treatment: 7 Wound Status Wound Number: 5 Primary Etiology: Venous Leg Ulcer Wound Location: Right Lower Leg - Medial Wound Status: Open Wounding Event: Blister Date Acquired: 08/18/2017 Weeks Of Treatment: 0 Clustered Wound: No Photos Photo Uploaded By: Curtis Sites on 08/22/2017 17:08:21 Wound Measurements Length: (cm) 5.3 Width: (cm) 4.5 Depth: (cm) 0.1 Area: (cm) 18.732 Volume: (cm) 1.873 % Reduction in Area: % Reduction in Volume: Epithelialization: None Tunneling: No Undermining: No Wound Description Classification: Partial Thickness Wound Margin: Flat and Intact Exudate Amount: Large Exudate Type: Serous Exudate Color: amber Foul Odor After Cleansing: No Slough/Fibrino Yes Wound Bed Granulation Amount: Large (67-100%) Exposed Structure Granulation Quality: Pink Fascia Exposed:  No Necrotic Amount: Small (1-33%) Fat Layer (Subcutaneous Tissue) Exposed: No Necrotic Quality: Adherent Slough Tendon Exposed: No Muscle Exposed: No Joint Exposed: No Bone Exposed: No Periwound Skin Texture Montelongo, Dallas H. ([id number]) Texture Color No Abnormalities Noted: No No Abnormalities Noted: No Callus: No Atrophie Blanche: No Crepitus: No Cyanosis: No Excoriation: No Ecchymosis: No Induration: No Erythema: No Rash: No Hemosiderin Staining: No Scarring: No Mottled: No Pallor: No Moisture Rubor: No No Abnormalities Noted: No Dry / Scaly: No Temperature / Pain Maceration: No Temperature: No Abnormality Wound Preparation Ulcer Cleansing: Rinsed/Irrigated with Saline Topical Anesthetic Applied: None Treatment Notes Wound #5 (Right, Medial Lower Leg) 1. Cleansed with: Clean wound with Normal Saline 4. Dressing Applied: Other dressing (specify in notes) 5. Secondary Dressing Applied Bordered Foam Dressing Notes silvercel Electronic Signature(s) Signed: 08/22/2017 5:10:36 PM By: Curtis Sites Entered By: Curtis Sites on 08/22/2017 15:14:48 Theresa Frank ([id number]) -------------------------------------------------------------------------------- Wound Assessment Details Patient Name: Theresa Frank Date of Service: 08/22/2017 2:30 PM Medical Record Number: [id number] Patient Account Number: [id number] Date of Birth/Sex: November 28, 1924 (83 y.o. Female) Treating RN: Curtis Sites Primary Care Rashanda Magloire: BABAOFF, MARCUS Other Clinician: Referring Terree Gaultney: BABAOFF, MARCUS Treating Jena Tegeler/Extender: Rudene Re in Treatment: 7 Wound Status Wound Number: 6 Primary Etiology: Venous Leg Ulcer Wound Location: Left Lower Leg - Lateral Wound Status: Open Wounding Event: Blister Date Acquired: 08/18/2017 Weeks Of Treatment: 0 Clustered Wound: No Photos Photo Uploaded By: Curtis Sites on 08/22/2017 17:09:53 Wound Measurements Length: (cm)  4.5 Width: (cm) 5.4 Depth: (cm) 0.1 Area: (cm) 19.085 Volume: (cm) 1.909 % Reduction in Area: % Reduction in Volume: Epithelialization: None Tunneling: No Undermining: No Wound Description Classification: Partial Thickness Wound Margin: Flat and Intact Exudate Amount: Large Exudate Type: Serous Exudate Color: amber Foul Odor After Cleansing: No Slough/Fibrino Yes Wound Bed Granulation Amount: Large (67-100%) Exposed Structure Granulation Quality: Pink Fascia Exposed: No Necrotic Amount: Small (1-33%) Fat Layer (Subcutaneous Tissue) Exposed: No Necrotic Quality: Adherent Slough Tendon Exposed: No Muscle Exposed: No Joint Exposed: No Bone Exposed: No Periwound Skin Texture Foulks, Makenli H. ([id number]) Texture Color No  Abnormalities Noted: No No Abnormalities Noted: No Callus: No Atrophie Blanche: No Crepitus: No Cyanosis: No Excoriation: No Ecchymosis: No Induration: No Erythema: No Rash: No Hemosiderin Staining: No Scarring: No Mottled: No Pallor: No Moisture Rubor: No No Abnormalities Noted: No Dry / Scaly: No Temperature / Pain Maceration: No Temperature: No Abnormality Wound Preparation Ulcer Cleansing: Rinsed/Irrigated with Saline Topical Anesthetic Applied: None Treatment Notes Wound #6 (Left, Lateral Lower Leg) 1. Cleansed with: Clean wound with Normal Saline 4. Dressing Applied: Other dressing (specify in notes) 5. Secondary Dressing Applied Bordered Foam Dressing Notes silvercel Electronic Signature(s) Signed: 08/22/2017 5:10:36 PM By: Curtis Sites Entered By: Curtis Sites on 08/22/2017 15:15:55 Theresa Frank ([id number]) -------------------------------------------------------------------------------- Vitals Details Patient Name: Theresa Frank Date of Service: 08/22/2017 2:30 PM Medical Record Number: [id number] Patient Account Number: [id number] Date of Birth/Sex: [birth year] (83 y.o. Female) Treating RN: Curtis Sites Primary Care Linkon Siverson: BABAOFF, MARCUS Other Clinician: Referring Cameo Shewell: BABAOFF, MARCUS Treating Jibran Crookshanks/Extender: Rudene Re in Treatment: 7 Vital Signs Time Taken: 14:53 Temperature (F): 97.6 Height (in): 68 Pulse (bpm): 54 Weight (lbs): 108 Respiratory Rate (breaths/min): 18 Body Mass Index (BMI): 16.4 Blood Pressure (mmHg): 106/63 Reference Range: 80 - 120 mg / dl Electronic Signature(s) Signed: 08/22/2017 5:10:36 PM By: Curtis Sites Entered By: Curtis Sites on 08/22/2017 14:55:28

## 2017-10-07 DEATH — deceased

## 2018-07-16 IMAGING — CT CT HEAD W/O CM
1 series · 16 of 29 positions shown, 20 images · non-contrast
Comparison: None.

CLINICAL DATA: Confusion and forgetfulness for 3 months.

EXAM:
CT HEAD WITHOUT CONTRAST
TECHNIQUE: Contiguous axial images were obtained from the base of the skull
through the vertex without intravenous contrast.

[Series 2: head wo · axial · 0.40mm/px · z∈[-113,+17]mm · 16 of 29 slices shown, 20 images]
[im 2/29  brain]
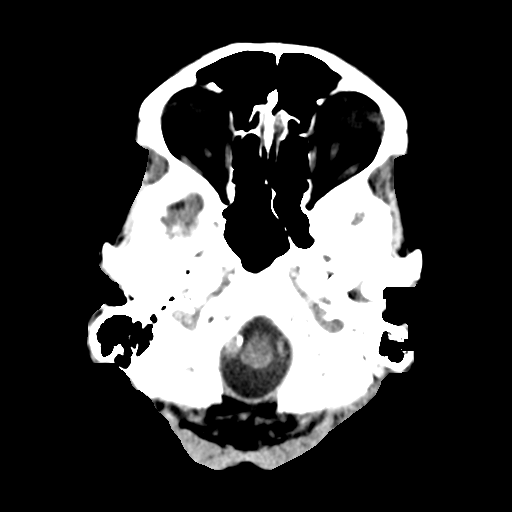
[im 2/29  bone]
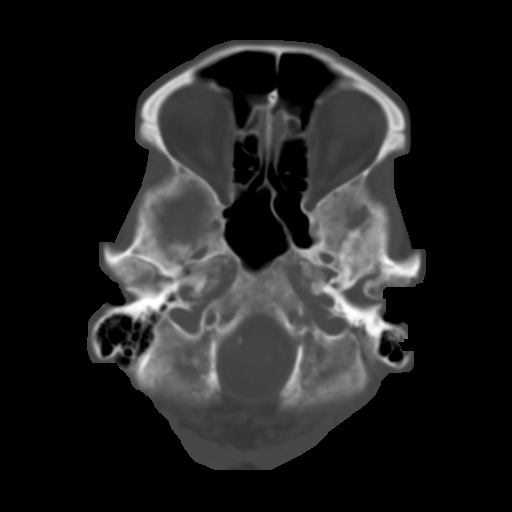
[im 4/29  brain]
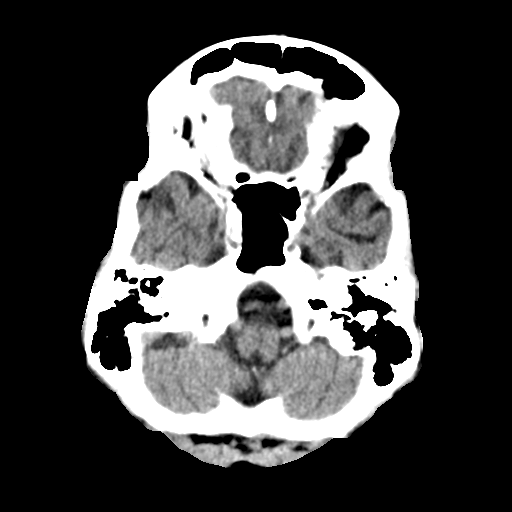
[im 6/29  brain]
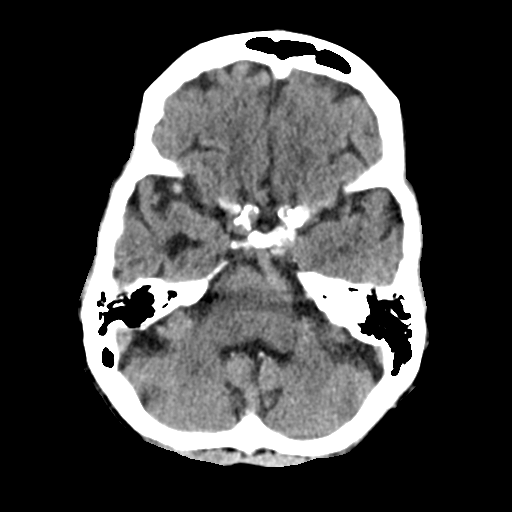
[im 7/29  brain]
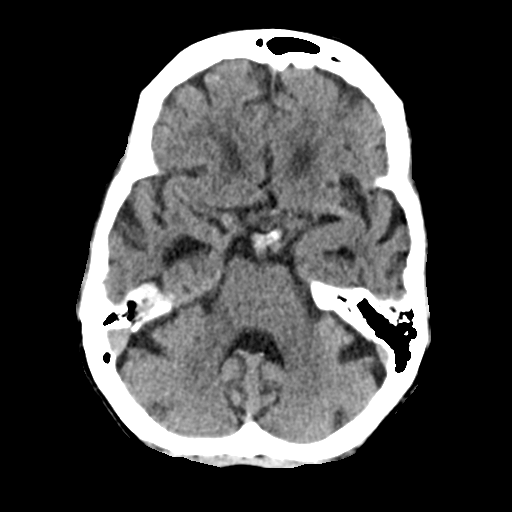
[im 9/29  brain]
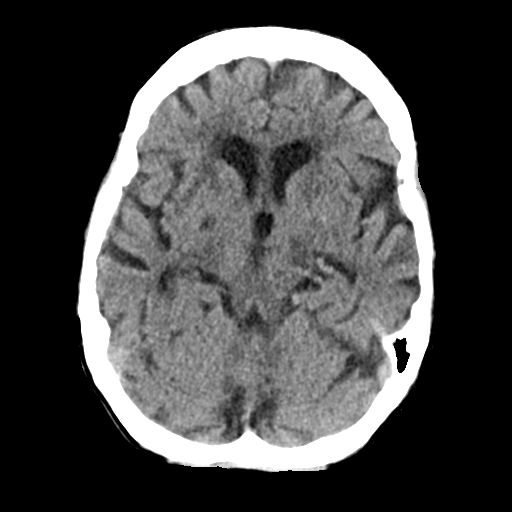
[im 9/29  bone]
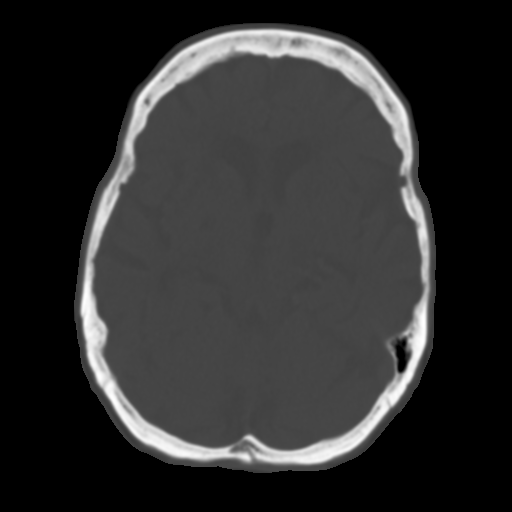
[im 11/29  brain]
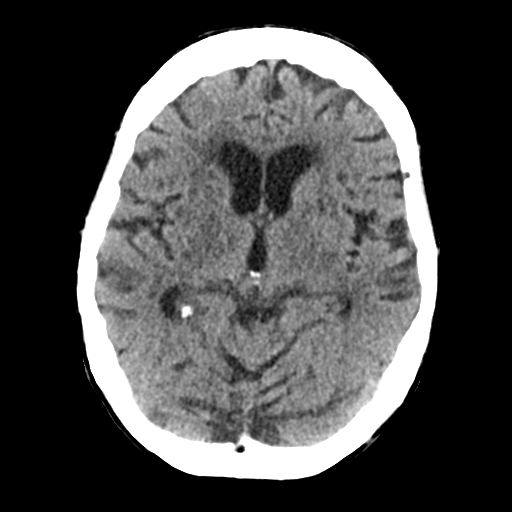
[im 12/29  brain]
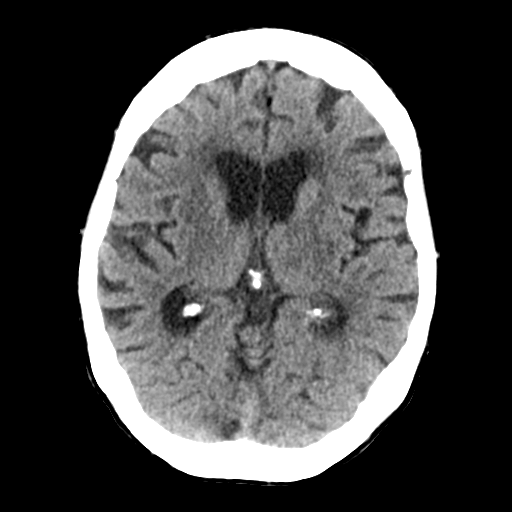
[im 14/29  brain]
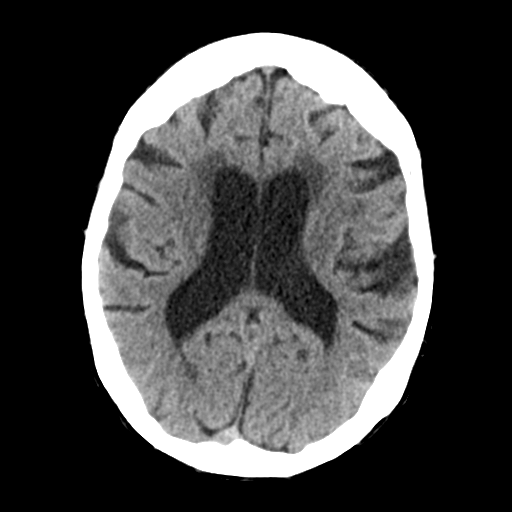
[im 16/29  brain]
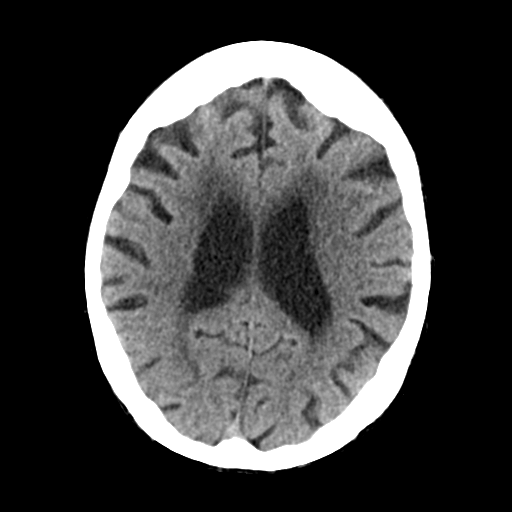
[im 16/29  bone]
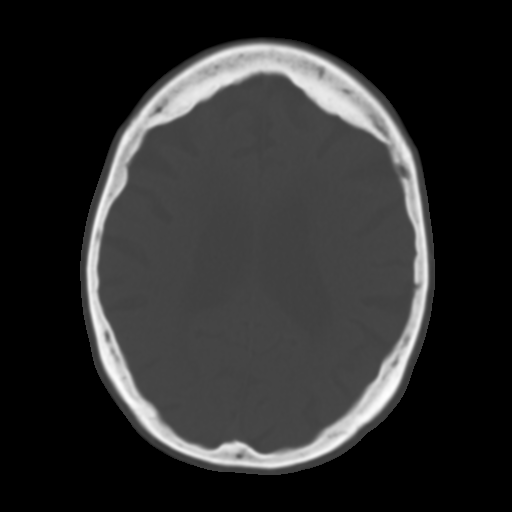
[im 18/29  brain]
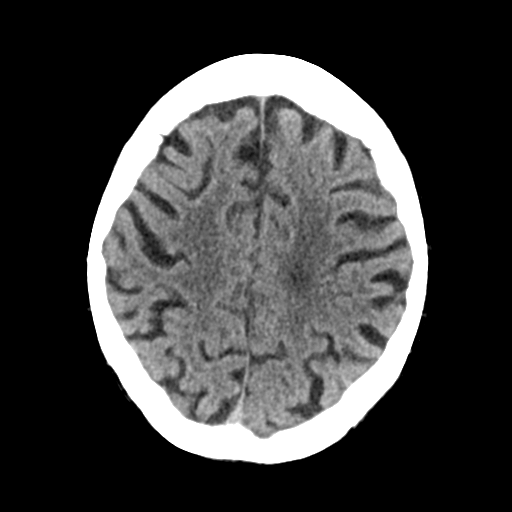
[im 19/29  brain]
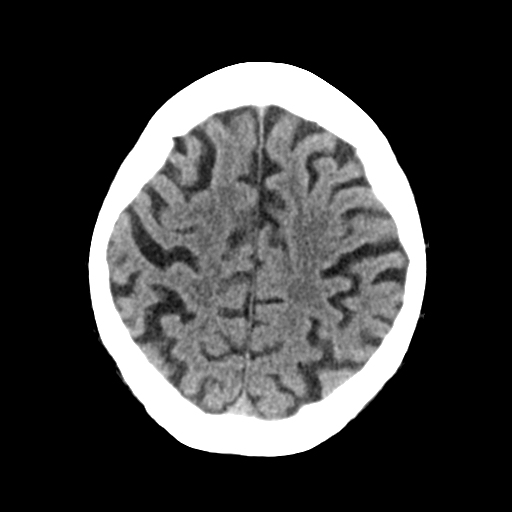
[im 21/29  brain]
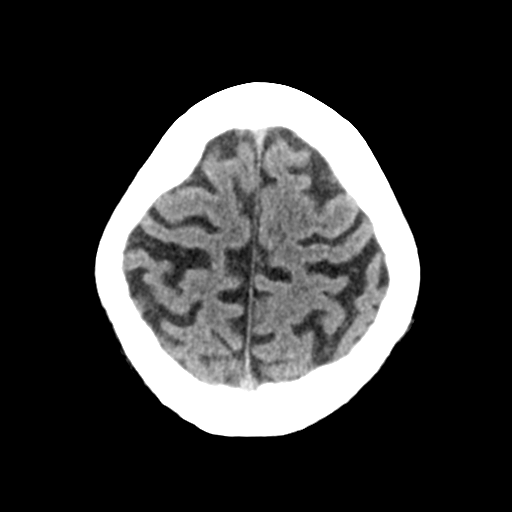
[im 23/29  brain]
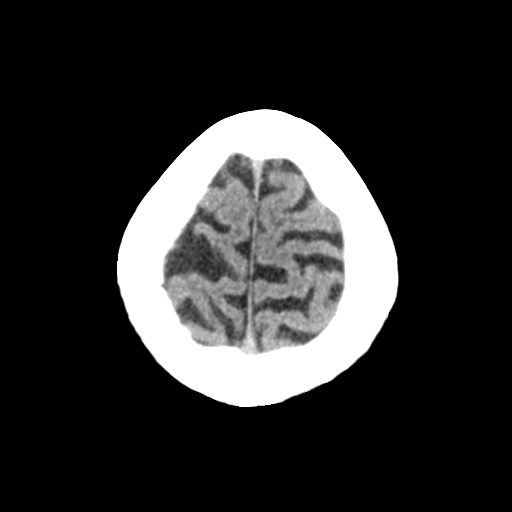
[im 23/29  bone]
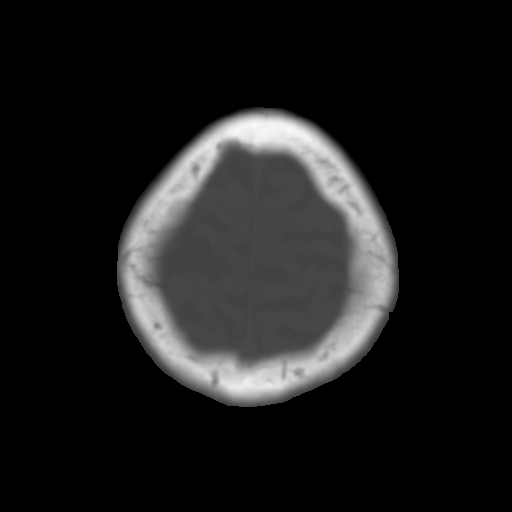
[im 24/29  brain]
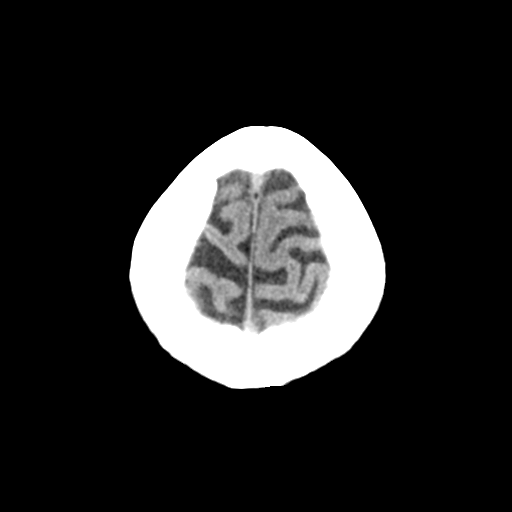
[im 26/29  brain]
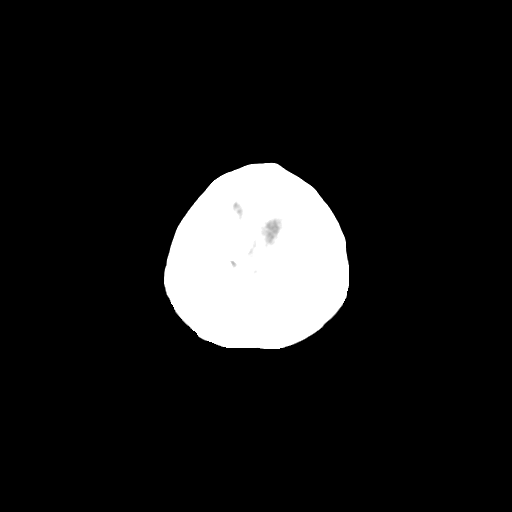
[im 28/29  brain]
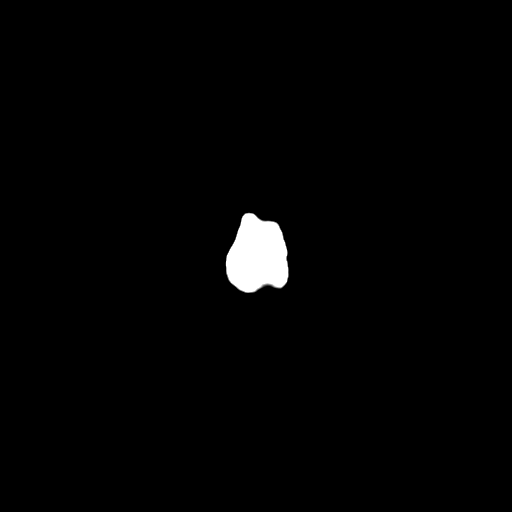

[16 of 29 positions shown; findings below may reference images not displayed]

FINDINGS: Brain: No evidence of acute infarction, hemorrhage, hydrocephalus,
extra-axial collection or mass lesion/mass effect. Advanced atrophy.
Chronic microvascular ischemic change. Chronic RIGHT basal ganglia
infarct.

Vascular: Advanced vascular calcification in the carotid siphons and
distal vertebrals, not unexpected for age.

Skull: No skull fracture or worrisome osseous lesion.

Sinuses/Orbits: Visualized portions unremarkable.

Other: No mastoid fluid.  Scalp soft tissues grossly negative.
IMPRESSION: Advanced atrophy and chronic microvascular ischemic change. Chronic
RIGHT basal ganglia infarct. No acute intracranial findings are
evident.

## 2018-12-28 ENCOUNTER — Ambulatory Visit (INDEPENDENT_AMBULATORY_CARE_PROVIDER_SITE_OTHER): Payer: Self-pay | Admitting: Orthopaedic Surgery

## 2018-12-28 ENCOUNTER — Other Ambulatory Visit: Payer: Self-pay

## 2019-03-14 IMAGING — CR DG PELVIS 1-2V
1 series · 1 of 1 positions shown · non-contrast
Comparison: None.

CLINICAL DATA: Left buttock and hip pain since a fall 02/02/2017.
Initial encounter.

EXAM:
PELVIS - 1-2 VIEW

[pelvis ap]
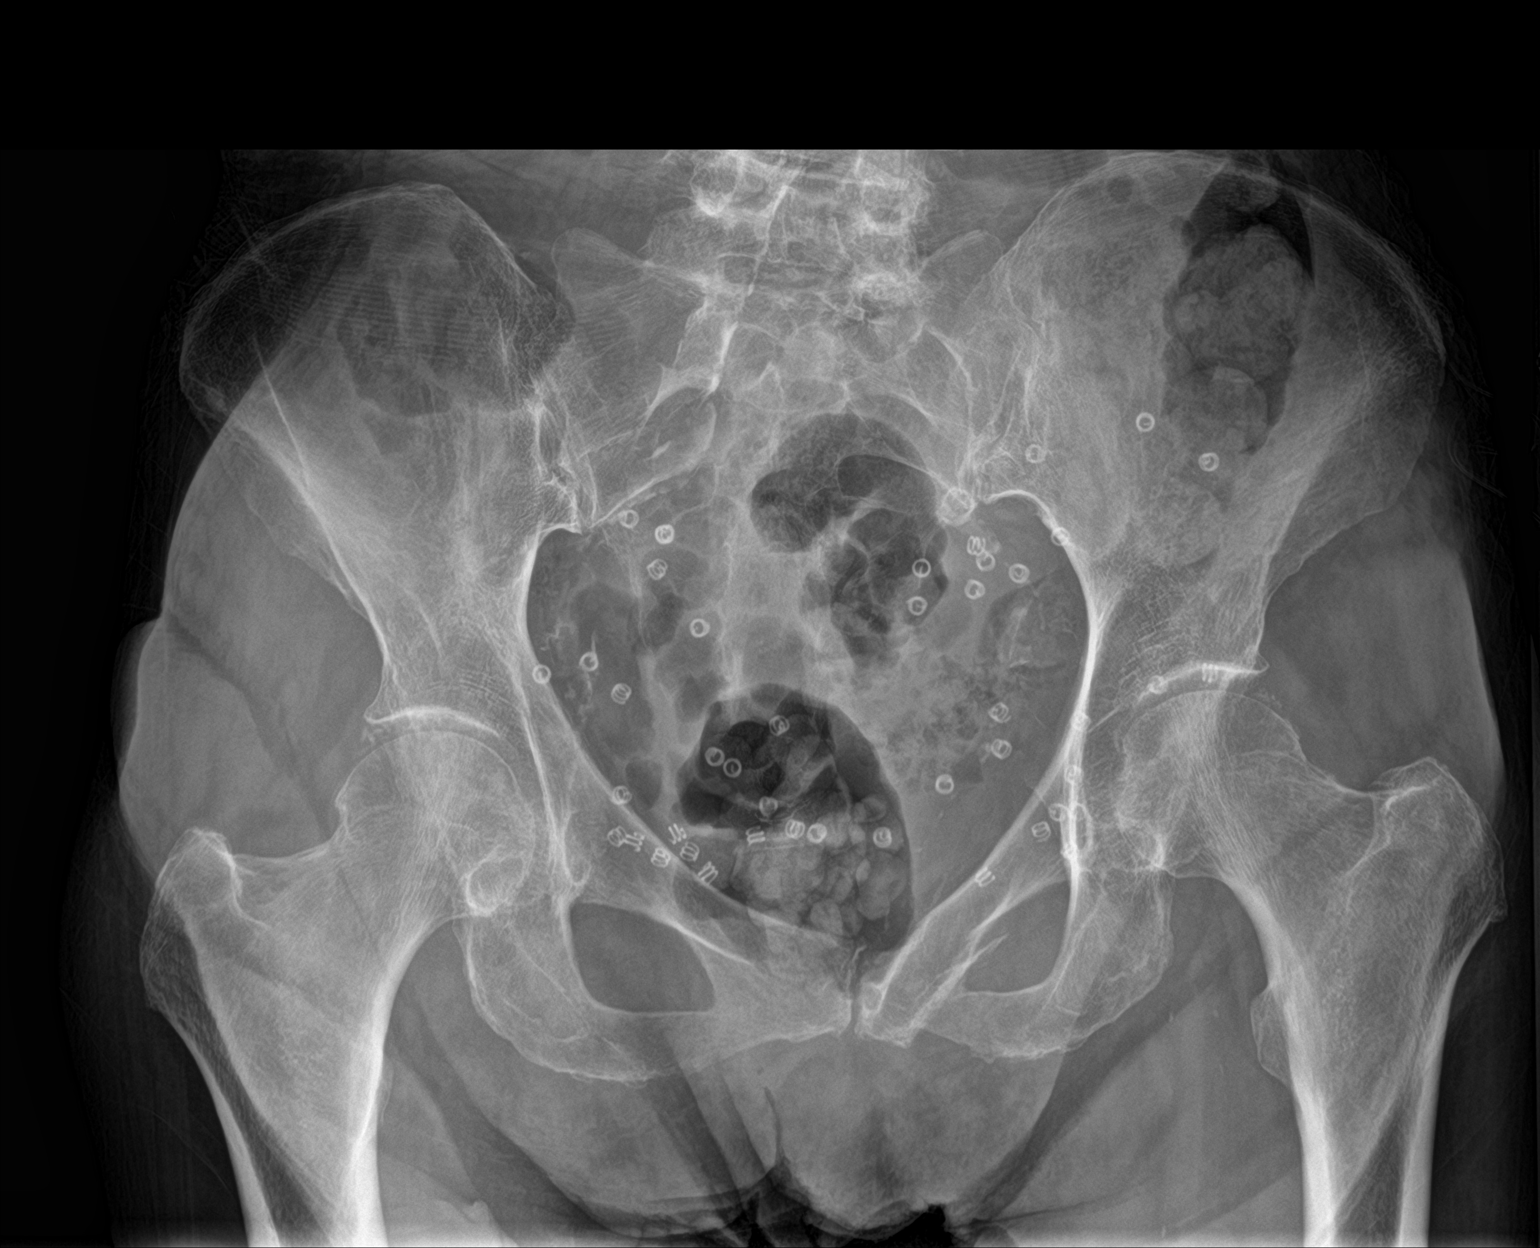

[1 of 1 positions shown; findings below may reference images not displayed]

FINDINGS: The patient has acute left parasymphyseal pubic bone fractures. No
other fracture is identified. Convex left lumbar scoliosis and
degenerative disease are noted. Atherosclerosis is identified. Mesh
from prior hernia repair is noted.
IMPRESSION: Acute left parasymphyseal pubic bone fractures.

Convex left lumbar scoliosis and spondylosis.

Atherosclerosis.
